# Patient Record
Sex: Female | Born: 1955 | Race: Black or African American | Hispanic: No | Marital: Single | Smoking: Former smoker
Health system: Southern US, Community
[De-identification: ages and names within clinical notes are randomized; demographics above are authoritative.]

## PROBLEM LIST (undated history)

## (undated) DIAGNOSIS — C801 Malignant (primary) neoplasm, unspecified: Secondary | ICD-10-CM

## (undated) DIAGNOSIS — I10 Essential (primary) hypertension: Secondary | ICD-10-CM

## (undated) DIAGNOSIS — J449 Chronic obstructive pulmonary disease, unspecified: Secondary | ICD-10-CM

## (undated) DIAGNOSIS — D649 Anemia, unspecified: Secondary | ICD-10-CM

## (undated) DIAGNOSIS — N189 Chronic kidney disease, unspecified: Secondary | ICD-10-CM

## (undated) DIAGNOSIS — K219 Gastro-esophageal reflux disease without esophagitis: Secondary | ICD-10-CM

## (undated) HISTORY — PX: EYE SURGERY: SHX253

## (undated) HISTORY — PX: FRACTURE SURGERY: SHX138

## (undated) HISTORY — PX: BREAST BIOPSY: SHX20

## (undated) HISTORY — DX: Gastro-esophageal reflux disease without esophagitis: K21.9

## (undated) HISTORY — DX: Anemia, unspecified: D64.9

## (undated) HISTORY — DX: Essential (primary) hypertension: I10

## (undated) HISTORY — DX: Chronic kidney disease, unspecified: N18.9

## (undated) HISTORY — PX: CERVIX LESION DESTRUCTION: SHX591

## (undated) HISTORY — PX: HUMERUS FRACTURE SURGERY: SHX670

---

## 1998-07-17 ENCOUNTER — Ambulatory Visit (HOSPITAL_COMMUNITY): Admission: RE | Admit: 1998-07-17 | Discharge: 1998-07-17 | Payer: Self-pay | Admitting: Internal Medicine

## 1999-07-05 ENCOUNTER — Ambulatory Visit (HOSPITAL_COMMUNITY): Admission: RE | Admit: 1999-07-05 | Discharge: 1999-07-05 | Payer: Self-pay | Admitting: Internal Medicine

## 1999-07-05 ENCOUNTER — Encounter: Payer: Self-pay | Admitting: Internal Medicine

## 2000-09-01 ENCOUNTER — Other Ambulatory Visit: Admission: RE | Admit: 2000-09-01 | Discharge: 2000-09-01 | Payer: Self-pay | Admitting: Internal Medicine

## 2002-09-22 ENCOUNTER — Other Ambulatory Visit: Admission: RE | Admit: 2002-09-22 | Discharge: 2002-09-22 | Payer: Self-pay | Admitting: Internal Medicine

## 2003-05-11 ENCOUNTER — Ambulatory Visit (HOSPITAL_BASED_OUTPATIENT_CLINIC_OR_DEPARTMENT_OTHER): Admission: RE | Admit: 2003-05-11 | Discharge: 2003-05-11 | Payer: Self-pay | Admitting: Orthopedic Surgery

## 2004-02-08 ENCOUNTER — Other Ambulatory Visit: Admission: RE | Admit: 2004-02-08 | Discharge: 2004-02-08 | Payer: Self-pay | Admitting: Internal Medicine

## 2004-08-11 ENCOUNTER — Emergency Department (HOSPITAL_COMMUNITY): Admission: AC | Admit: 2004-08-11 | Discharge: 2004-08-11 | Payer: Self-pay

## 2004-08-15 ENCOUNTER — Ambulatory Visit (HOSPITAL_COMMUNITY): Admission: RE | Admit: 2004-08-15 | Discharge: 2004-08-16 | Payer: Self-pay | Admitting: Orthopedic Surgery

## 2004-10-17 ENCOUNTER — Encounter: Admission: RE | Admit: 2004-10-17 | Discharge: 2005-01-15 | Payer: Self-pay | Admitting: Orthopedic Surgery

## 2008-02-24 ENCOUNTER — Ambulatory Visit: Payer: Self-pay | Admitting: Gastroenterology

## 2008-03-07 ENCOUNTER — Encounter: Admission: RE | Admit: 2008-03-07 | Discharge: 2008-03-07 | Payer: Self-pay | Admitting: Internal Medicine

## 2008-03-07 ENCOUNTER — Encounter (INDEPENDENT_AMBULATORY_CARE_PROVIDER_SITE_OTHER): Payer: Self-pay | Admitting: Diagnostic Radiology

## 2008-03-09 ENCOUNTER — Ambulatory Visit: Payer: Self-pay | Admitting: Gastroenterology

## 2008-03-09 HISTORY — PX: COLONOSCOPY: SHX174

## 2011-05-10 NOTE — Op Note (Signed)
NAME:  Vanessa Williamson, ASPLIN                           ACCOUNT NO.:  [id number]   MEDICAL RECORD NO.:  [id number]                   PATIENT TYPE:  AMB   LOCATION:  [place]                                  FACILITY:  [facility]   PHYSICIAN:  Kathalene Frames. Mayer Camel, M.D.                DATE OF BIRTH:  1956-08-22   DATE OF PROCEDURE:  05/11/2003  DATE OF DISCHARGE:                                 OPERATIVE REPORT   PREOPERATIVE DIAGNOSIS:  Right knee lateral meniscal tear.   POSTOPERATIVE DIAGNOSIS:  Right knee lateral meniscal tear.  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   PROCEDURE:  Right knee arthroscopic partial lateral meniscectomy,  debridement of partial tear of the ACL, and debridement of chondromalacia  lateral tibial condyle.   SURGEON:  Kathalene Frames. Mayer Camel, M.D.   ASSISTANT:  None.   ANESTHESIA:  Local with IV sedation.   ESTIMATED BLOOD LOSS:  Minimal.   FLUIDS REPLACED:  500 mL of crystalloid.  Drains placed; none.   TOURNIQUET TIME:  None.   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  Six-week history of gradually increasing  lateral right knee joint line pain consistent with a lateral meniscal tear  with chondromalacia, failed conservative treatment, and now desires elective  arthroscopic evaluation and treatment of her right knee.   DESCRIPTION OF PROCEDURE:  The patient identified by armband and taken to  the operating room at [facility]. Appropriate anesthetic  monitors were attached and local anesthesia with IV sedation induced with  the patient in the supine position with lateral posts applied to the table  and then the right lower extremity was prepped and draped in the usual  sterile  fashion from the ankle to the mid thigh.  We began the procedure by  making standard inferomedial and inferolateral peripatellar portals allowing  introduction of the arthroscope through the inferolateral portal and the  outflow through the inferomedial portal.  Diagnostic arthroscopy of the  patella and trochlea revealed essentially no chondromalacia.  The medial  compartment also appeared to be in good condition.  There was a partial tear  of the lateral fibers of the ACL with the fibers subluxing in and out of the  lateral compartment and these were debrided back to a stable margin, maybe 5  or 10% of the fibers. 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  The gutters were cleared.  The scope was taken to the  lateral side of the PCL, clearing the posterior compartment after swapping  portals. The knee was then  washed out with normal saline solution and the arthroscopic instruments were  removed.  A dressing of Xeroform, 4x4 dressing, sponges, Webril, and an Ace  wrap applied.  The patient was awakened and taken to the recovery room  without difficulty.                                               Kathalene Frames. Mayer Camel, M.D.    Alphonsus Sias  D:  05/11/2003  T:  05/11/2003  Job:  [id number]
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
## 2012-03-08 ENCOUNTER — Ambulatory Visit (INDEPENDENT_AMBULATORY_CARE_PROVIDER_SITE_OTHER): Payer: Managed Care, Other (non HMO) | Admitting: Family Medicine

## 2012-03-08 VITALS — BP 125/83 | HR 78 | Temp 98.0°F | Resp 18 | Ht 64.0 in | Wt 255.0 lb

## 2012-03-08 DIAGNOSIS — I10 Essential (primary) hypertension: Secondary | ICD-10-CM | POA: Insufficient documentation

## 2012-03-08 DIAGNOSIS — J309 Allergic rhinitis, unspecified: Secondary | ICD-10-CM | POA: Insufficient documentation

## 2012-03-08 DIAGNOSIS — E669 Obesity, unspecified: Secondary | ICD-10-CM

## 2012-03-08 DIAGNOSIS — J301 Allergic rhinitis due to pollen: Secondary | ICD-10-CM

## 2012-03-08 DIAGNOSIS — F17200 Nicotine dependence, unspecified, uncomplicated: Secondary | ICD-10-CM

## 2012-03-08 DIAGNOSIS — K219 Gastro-esophageal reflux disease without esophagitis: Secondary | ICD-10-CM | POA: Insufficient documentation

## 2012-03-08 DIAGNOSIS — Z720 Tobacco use: Secondary | ICD-10-CM

## 2012-03-08 LAB — COMPREHENSIVE METABOLIC PANEL
ALT: 10 U/L (ref 0–35)
AST: 15 U/L (ref 0–37)
Alkaline Phosphatase: 87 U/L (ref 39–117)
CO2: 24 mEq/L (ref 19–32)
Creat: 0.89 mg/dL (ref 0.50–1.10)
Total Bilirubin: 0.5 mg/dL (ref 0.3–1.2)

## 2012-03-08 LAB — LIPID PANEL
Cholesterol: 179 mg/dL (ref 0–200)
LDL Cholesterol: 121 mg/dL — ABNORMAL HIGH (ref 0–99)
Total CHOL/HDL Ratio: 4.4 Ratio
VLDL: 17 mg/dL (ref 0–40)

## 2012-03-08 MED ORDER — LISINOPRIL-HYDROCHLOROTHIAZIDE 20-12.5 MG PO TABS: 1.0000 | ORAL_TABLET | Freq: Every day | ORAL | Status: DC

## 2012-03-08 NOTE — Patient Instructions (Addendum)
Continue same meds.  Continue to work on diet and exercise for weight loss.  Schedule physical in next 6 months. Smoking Cessation This document explains the best ways for you to quit smoking and new treatments to help. It lists new medicines that can double or triple your chances of quitting and quitting for good. It also considers ways to avoid relapses and concerns you may have about quitting, including weight gain. NICOTINE: A POWERFUL ADDICTION If you have tried to quit smoking, you know how hard it can be. It is hard because nicotine is a very addictive drug. For some people, it can be as addictive as heroin or cocaine. Usually, people make 2 or 3 tries, or more, before finally being able to quit. Each time you try to quit, you can learn about what helps and what hurts. Quitting takes hard work and a lot of effort, but you can quit smoking. QUITTING SMOKING IS ONE OF THE MOST IMPORTANT THINGS YOU WILL EVER DO.  You will live longer, feel better, and live better.   The impact on your body of quitting smoking is felt almost immediately:   Within 20 minutes, blood pressure decreases. Pulse returns to its normal level.   After 8 hours, carbon monoxide levels in the blood return to normal. Oxygen level increases.   After 24 hours, chance of heart attack starts to decrease. Breath, hair, and body stop smelling like smoke.   After 48 hours, damaged nerve endings begin to recover. Sense of taste and smell improve.   After 72 hours, the body is virtually free of nicotine. Bronchial tubes relax and breathing becomes easier.   After 2 to 12 weeks, lungs can hold more air. Exercise becomes easier and circulation improves.   Quitting will reduce your risk of having a heart attack, stroke, cancer, or lung disease:   After 1 year, the risk of coronary heart disease is cut in half.   After 5 years, the risk of stroke falls to the same as a nonsmoker.   After 10 years, the risk of lung cancer is  cut in half and the risk of other cancers decreases significantly.   After 15 years, the risk of coronary heart disease drops, usually to the level of a nonsmoker.   If you are pregnant, quitting smoking will improve your chances of having a healthy baby.   The people you live with, especially your children, will be healthier.   You will have extra money to spend on things other than cigarettes.  FIVE KEYS TO QUITTING Studies have shown that these 5 steps will help you quit smoking and quit for good. You have the best chances of quitting if you use them together: 1. Get ready.  2. Get support and encouragement.  3. Learn new skills and behaviors.  4. Get medicine to reduce your nicotine addiction and use it correctly.  5. Be prepared for relapse or difficult situations. Be determined to continue trying to quit, even if you do not succeed at first.  1. GET READY  Set a quit date.   Change your environment.   Get rid of ALL cigarettes, ashtrays, matches, and lighters in your home, car, and place of work.   Do not let people smoke in your home.   Review your past attempts to quit. Think about what worked and what did not.   Once you quit, do not smoke. NOT EVEN A PUFF!  2. GET SUPPORT AND ENCOURAGEMENT Studies have shown that  you have a better chance of being successful if you have help. You can get support in many ways.  Tell your family, friends, and coworkers that you are going to quit and need their support. Ask them not to smoke around you.   Talk to your caregivers (doctor, dentist, nurse, pharmacist, psychologist, and/or smoking counselor).   Get individual, group, or telephone counseling and support. The more counseling you have, the better your chances are of quitting. Programs are available at [employer] and health centers. Call your local health department for information about programs in your area.   Spiritual beliefs and practices may help some smokers quit.    Quit meters are Insurance underwriter that keep track of quit statistics, such as amount of "quit-time," cigarettes not smoked, and money saved.   Many smokers find one or more of the many self-help books available useful in helping them quit and stay off tobacco.  3. LEARN NEW SKILLS AND BEHAVIORS  Try to distract yourself from urges to smoke. Talk to someone, go for a walk, or occupy your time with a task.   When you first try to quit, change your routine. Take a different route to work. Drink tea instead of coffee. Eat breakfast in a different place.   Do something to reduce your stress. Take a hot bath, exercise, or read a book.   Plan something enjoyable to do every day. Reward yourself for not smoking.   Explore interactive web-based programs that specialize in helping you quit.  4. GET MEDICINE AND USE IT CORRECTLY Medicines can help you stop smoking and decrease the urge to smoke. Combining medicine with the above behavioral methods and support can quadruple your chances of successfully quitting smoking. The U.[place]. Food and Drug Administration (FDA) has approved 7 medicines to help you quit smoking. These medicines fall into 3 categories.  Nicotine replacement therapy (delivers nicotine to your body without the negative effects and risks of smoking):   Nicotine gum: Available over-the-counter.   Nicotine lozenges: Available over-the-counter.   Nicotine inhaler: Available by prescription.   Nicotine nasal spray: Available by prescription.   Nicotine skin patches (transdermal): Available by prescription and over-the-counter.   Antidepressant medicine (helps people abstain from smoking, but how this works is unknown):   Bupropion sustained-release (SR) tablets: Available by prescription.   Nicotinic receptor partial agonist (simulates the effect of nicotine in your brain):   Varenicline tartrate tablets: Available by prescription.   Ask your  caregiver for advice about which medicines to use and how to use them. Carefully read the information on the package.   Everyone who is trying to quit may benefit from using a medicine. If you are pregnant or trying to become pregnant, nursing an infant, you are under age 33, or you smoke fewer than 10 cigarettes per day, talk to your caregiver before taking any nicotine replacement medicines.   You should stop using a nicotine replacement product and call your caregiver if you experience nausea, dizziness, weakness, vomiting, fast or irregular heartbeat, mouth problems with the lozenge or gum, or redness or swelling of the skin around the patch that does not go away.   Do not use any other product containing nicotine while using a nicotine replacement product.   Talk to your caregiver before using these products if you have diabetes, heart disease, asthma, stomach ulcers, you had a recent heart attack, you have high blood pressure that is not controlled with medicine, a history  of irregular heartbeat, or you have been prescribed medicine to help you quit smoking.  5. BE PREPARED FOR RELAPSE OR DIFFICULT SITUATIONS  Most relapses occur within the first 3 months after quitting. Do not be discouraged if you start smoking again. Remember, most people try several times before they finally quit.   You may have symptoms of withdrawal because your body is used to nicotine. You may crave cigarettes, be irritable, feel very hungry, cough often, get headaches, or have difficulty concentrating.   The withdrawal symptoms are only temporary. They are strongest when you first quit, but they will go away within 10 to 14 days.  Here are some difficult situations to watch for:  Alcohol. Avoid drinking alcohol. Drinking lowers your chances of successfully quitting.   Caffeine. Try to reduce the amount of caffeine you consume. It also lowers your chances of successfully quitting.   Other smokers. Being around  smoking can make you want to smoke. Avoid smokers.   Weight gain. Many smokers will gain weight when they quit, usually less than 10 pounds. Eat a healthy diet and stay active. Do not let weight gain distract you from your main goal, quitting smoking. Some medicines that help you quit smoking may also help delay weight gain. You can always lose the weight gained after you quit.   Bad mood or depression. There are a lot of ways to improve your mood other than smoking.  If you are having problems with any of these situations, talk to your caregiver. SPECIAL SITUATIONS AND CONDITIONS Studies suggest that everyone can quit smoking. Your situation or condition can give you a special reason to quit.  Pregnant women/new mothers: By quitting, you protect your baby's health and your own.   Hospitalized patients: By quitting, you reduce health problems and help healing.   Heart attack patients: By quitting, you reduce your risk of a second heart attack.   Lung, head, and neck cancer patients: By quitting, you reduce your chance of a second cancer.   Parents of children and adolescents: By quitting, you protect your children from illnesses caused by secondhand smoke.  QUESTIONS TO THINK ABOUT Think about the following questions before you try to stop smoking. You may want to talk about your answers with your caregiver.  Why do you want to quit?   If you tried to quit in the past, what helped and what did not?   What will be the most difficult situations for you after you quit? How will you plan to handle them?   Who can help you through the tough times? Your family? Friends? Caregiver?   What pleasures do you get from smoking? What ways can you still get pleasure if you quit?  Here are some questions to ask your caregiver:  How can you help me to be successful at quitting?   What medicine do you think would be best for me and how should I take it?   What should I do if I need more help?    What is smoking withdrawal like? How can I get information on withdrawal?  Quitting takes hard work and a lot of effort, but you can quit smoking. FOR MORE INFORMATION  Smokefree.gov (Inrails.tn) provides free, accurate, evidence-based information and professional assistance to help support the immediate and long-term needs of people trying to quit smoking. Document Released: 12/03/2001 Document Revised: 11/28/2011 Document Reviewed: 09/25/2009 [facility] Patient Information 2012 [place].

## 2012-03-08 NOTE — Progress Notes (Signed)
  Subjective:    Patient ID: Vanessa Williamson, female    DOB: 1956-08-09, 56 y.o.   MRN: [id number]  HPI  Vanessa Williamson is a 56 y.o. female HTN - no recent home BP's.   Feels well.     Last dose last night.  On baby asa qd.  Lost 30# past few years - 6 pounds since July 2012.  Stopped sodas.  Had bloodwork last year at work - cholesterol abnormal.  coffee this am - some creamer and splenda at 7:15am.  Allergies ok, notes sinuses flair with change in season - has flonase - helps sx's, claritin qd (otc)  Tobacco abuse.  Contemplating quitting. 1 ppd.  Review of Systems  Constitutional: Negative for fever and chills.  HENT: Positive for congestion, rhinorrhea and sneezing.        With allergy flairs.  Respiratory: Negative for cough, chest tightness and shortness of breath.        Smokers cough.  No new symptoms.   Cardiovascular: Negative for chest pain and palpitations.  Neurological: Negative for dizziness, light-headedness and headaches.       Objective:   Physical Exam  Constitutional: She is oriented to person, place, and time. She appears well-developed and well-nourished.       Overweight.  HENT:  Head: Normocephalic and atraumatic.  Right Ear: External ear normal.  Left Ear: External ear normal.  Mouth/Throat: Oropharynx is clear and moist.  Eyes: Conjunctivae are normal. Pupils are equal, round, and reactive to light.  Cardiovascular: Normal rate, regular rhythm, normal heart sounds and intact distal pulses.   No murmur heard. Pulmonary/Chest: Effort normal and breath sounds normal. No respiratory distress. She has no wheezes.  Neurological: She is alert and oriented to person, place, and time.  Skin: Skin is warm and dry.  Psychiatric: She has a normal mood and affect. Her behavior is normal.        Assessment & Plan:  Vanessa Williamson is a 56 y.o. female 1. HTN (hypertension)  Comprehensive metabolic panel, Lipid panel  2. Allergic rhinitis    3. Obesity    4. Tobacco  abuse     htn controlled.  Continue same dose lisinoprl/hct - # 30 sent to pharmacy and #90, 1 rf hand written for sendout pharmacy. abnl lipids by hx - check CMP, lipid panel.  Discussed tobacco cessation - contemplating stage - handout on options.  If any increased or change in cough, return to clinic.  Allergies controlled with flonase prn and claritin.  Schedule cpe in next 6 months.

## 2012-08-18 ENCOUNTER — Ambulatory Visit (INDEPENDENT_AMBULATORY_CARE_PROVIDER_SITE_OTHER): Payer: Managed Care, Other (non HMO) | Admitting: Internal Medicine

## 2012-08-18 ENCOUNTER — Ambulatory Visit: Payer: Managed Care, Other (non HMO)

## 2012-08-18 ENCOUNTER — Telehealth: Payer: Self-pay

## 2012-08-18 VITALS — BP 138/80 | HR 97 | Temp 98.4°F | Resp 18 | Ht 64.0 in | Wt 249.0 lb

## 2012-08-18 DIAGNOSIS — F17200 Nicotine dependence, unspecified, uncomplicated: Secondary | ICD-10-CM

## 2012-08-18 DIAGNOSIS — R9389 Abnormal findings on diagnostic imaging of other specified body structures: Secondary | ICD-10-CM

## 2012-08-18 DIAGNOSIS — I10 Essential (primary) hypertension: Secondary | ICD-10-CM

## 2012-08-18 DIAGNOSIS — R079 Chest pain, unspecified: Secondary | ICD-10-CM

## 2012-08-18 DIAGNOSIS — J449 Chronic obstructive pulmonary disease, unspecified: Secondary | ICD-10-CM

## 2012-08-18 LAB — POCT CBC
Hemoglobin: 12.3 g/dL (ref 12.2–16.2)
Lymph, poc: 1.2 (ref 0.6–3.4)
MCH, POC: 30.1 pg (ref 27–31.2)
MCHC: 30.4 g/dL — AB (ref 31.8–35.4)
MPV: 9 fL (ref 0–99.8)
POC MID %: 7.3 %M (ref 0–12)
WBC: 3.9 10*3/uL — AB (ref 4.6–10.2)

## 2012-08-18 LAB — COMPREHENSIVE METABOLIC PANEL
BUN: 21 mg/dL (ref 6–23)
CO2: 27 mEq/L (ref 19–32)
Creat: 0.91 mg/dL (ref 0.50–1.10)
Glucose, Bld: 92 mg/dL (ref 70–99)
Total Bilirubin: 0.5 mg/dL (ref 0.3–1.2)
Total Protein: 7 g/dL (ref 6.0–8.3)

## 2012-08-18 MED ORDER — MELOXICAM 15 MG PO TABS: 15.0000 mg | ORAL_TABLET | Freq: Every day | ORAL | Status: AC

## 2012-08-18 NOTE — Progress Notes (Signed)
  Subjective:    Patient ID: Vanessa Williamson, female    DOB: 1956/07/12, 56 y.o.   MRN: [id number]  HPI presents today L side chest pain x 1 week. It has been getting worse. The pain is worse at night. Sleeps elevated cant sleep on L side. The pain is worse with bending,twistin, and deep breathing. does feel SOB because is afraid to breathe to deep due to pain. Walking or activities does not make it worse. Did have some nausea today. She does have HX of acid reflux but not on any current treatment.. Does not have any HX of palpitations or heart problems. Has been having a cough but relates that to smoking. She is a current smoker of 1 ppd.    Review of Systems     Objective:   Physical Exam        Assessment & Plan:

## 2012-08-18 NOTE — Telephone Encounter (Signed)
Called with call report for chest xray. Please review
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
## 2012-08-19 DIAGNOSIS — J449 Chronic obstructive pulmonary disease, unspecified: Secondary | ICD-10-CM | POA: Insufficient documentation

## 2012-08-19 NOTE — Telephone Encounter (Signed)
Not available at any of 3 numbers and home # diusconnected I need to talk to her because we need to proceed with another study CXR showed an area on or next to L 9th rib that needs to be identified by CT We need to schedule CT Lung Would you keep trying to reach her

## 2012-08-20 NOTE — Telephone Encounter (Signed)
I have called her, unable to reach, I have also mailed copy of the chest xray with a note to call me about this issue.

## 2012-08-20 NOTE — Telephone Encounter (Signed)
PT STATES SOMEONE TRIED TO CALL HER AND SHE HAVE NO IDEA WHY. [place] [phone]

## 2012-08-20 NOTE — Telephone Encounter (Signed)
Tried to call pt again at # pt left and got the same message that she is not available and to try again later. Did not allow me to LM.

## 2012-08-20 NOTE — Telephone Encounter (Signed)
I have called patient about her CXR< should this CT scan be with or without contrast? Please advise.

## 2012-08-21 NOTE — Telephone Encounter (Signed)
With contrast

## 2012-08-23 NOTE — Addendum Note (Signed)
Addended byCandice Camp on: 08/23/2012 01:25 PM   Modules accepted: Orders

## 2012-08-23 NOTE — Telephone Encounter (Signed)
I have ordered CT scan, patient aware we will be calling her with this.

## 2012-08-25 ENCOUNTER — Encounter: Payer: Self-pay | Admitting: Family Medicine

## 2012-08-25 ENCOUNTER — Ambulatory Visit: Payer: Managed Care, Other (non HMO)

## 2012-08-25 ENCOUNTER — Ambulatory Visit (INDEPENDENT_AMBULATORY_CARE_PROVIDER_SITE_OTHER): Payer: Managed Care, Other (non HMO) | Admitting: Family Medicine

## 2012-08-25 VITALS — BP 130/84 | HR 108 | Temp 98.0°F | Resp 16

## 2012-08-25 DIAGNOSIS — M79609 Pain in unspecified limb: Secondary | ICD-10-CM

## 2012-08-25 DIAGNOSIS — M25579 Pain in unspecified ankle and joints of unspecified foot: Secondary | ICD-10-CM

## 2012-08-25 DIAGNOSIS — M79673 Pain in unspecified foot: Secondary | ICD-10-CM

## 2012-08-25 DIAGNOSIS — T148XXA Other injury of unspecified body region, initial encounter: Secondary | ICD-10-CM

## 2012-08-25 MED ORDER — TRAMADOL HCL 50 MG PO TABS: 50.0000 mg | ORAL_TABLET | Freq: Three times a day (TID) | ORAL | Status: AC | PRN

## 2012-08-25 NOTE — Progress Notes (Signed)
[facility]:  Office Visit  Chief Complaint:  Chief Complaint  Patient presents with  . Ankle Pain    right foot/ankle pain after slipping on gumballs in the yard between buildings at work    HPI: Vanessa Williamson is a 56 y.o. female who complains of  Right ankle 7-8/10 sharp localized pain today, stepped on gumball plant dropping and fell. Does not know how she fell whether inversion or eversion injury. Pain is sharp and on lateral right foot. + ankle swelling  Past Medical History  Diagnosis Date  . Hypertension    Past Surgical History  Procedure Date  . Humerus fracture surgery    History   Social History  . Marital Status: Single    Spouse Name: N/A    Number of Children: N/A  . Years of Education: N/A   Social History Main Topics  . Smoking status: Current Everyday Smoker  . Smokeless tobacco: None  . Alcohol Use: No  . Drug Use: None  . Sexually Active: None   Other Topics Concern  . None   Social History Narrative  . None   No family history on file. No Known Allergies Prior to Admission medications   Medication Sig Start Date End Date Taking? Authorizing Provider  aspirin 81 MG tablet Take 81 mg by mouth daily.   Yes Historical Provider, MD  fluticasone (FLONASE) 50 MCG/ACT nasal spray Place 2 sprays into the nose daily.   Yes Historical Provider, MD  lisinopril-hydrochlorothiazide (PRINZIDE,ZESTORETIC) 20-12.5 MG per tablet Take 1 tablet by mouth daily. 03/08/12  Yes Wendie Agreste, MD  loratadine (CLARITIN) 10 MG tablet Take 10 mg by mouth daily.   Yes Historical Provider, MD  meloxicam (MOBIC) 15 MG tablet Take 1 tablet (15 mg total) by mouth daily. 08/18/12 08/18/13 Yes Leandrew Koyanagi, MD     ROS: The patient denies fevers, chills, night sweats, unintentional weight loss, chest pain, palpitations, wheezing, dyspnea on exertion, nausea, vomiting, abdominal pain, dysuria, hematuria, melena, numbness, weakness, or tingling.   All  other systems have been reviewed and were otherwise negative with the exception of those mentioned in the HPI and as above.    PHYSICAL EXAM: Filed Vitals:   08/25/12 1538  BP: 130/84  Pulse: 108  Temp: 98 F (36.7 C)  Resp: 16   There were no vitals filed for this visit. There is no height or weight on file to calculate BMI.  General: Alert, no acute distress HEENT:  Normocephalic, atraumatic, oropharynx patent.  Cardiovascular:  Regular rate and rhythm, no rubs murmurs or gallops.  No Carotid bruits, radial pulse intact. No pedal edema.  Respiratory: Clear to auscultation bilaterally.  No wheezes, rales, or rhonchi.  No cyanosis, no use of accessory musculature GI: No organomegaly, abdomen is soft and non-tender, positive bowel sounds.  No masses. Skin: No rashes. Neurologic: Facial musculature symmetric. Psychiatric: Patient is appropriate throughout our interaction. Lymphatic: No cervical lymphadenopathy Musculoskeletal: In wheelchair. + right lateral malleoli swelling. + tenderness at MTP  5th lateral foot 5/5 strength, sensation intact. + DP   LABS: Results for orders placed in visit on 08/18/12  POCT SEDIMENTATION RATE      Component Value Range   POCT SED RATE 55 (*) 0 - 22 mm/hr  COMPREHENSIVE METABOLIC PANEL      Component Value Range   Sodium 139  135 - 145 mEq/L   Potassium 4.8  3.5 - 5.3 mEq/L   Chloride 105  96 - 112 mEq/L   CO2 27  19 - 32 mEq/L   Glucose, Bld 92  70 - 99 mg/dL   BUN 21  6 - 23 mg/dL   Creat 0.91  0.50 - 1.10 mg/dL   Total Bilirubin 0.5  0.3 - 1.2 mg/dL   Alkaline Phosphatase 82  39 - 117 U/L   AST 15  0 - 37 U/L   ALT 10  0 - 35 U/L   Total Protein 7.0  6.0 - 8.3 g/dL   Albumin 4.6  3.5 - 5.2 g/dL   Calcium 9.6  8.4 - 10.5 mg/dL  POCT CBC      Component Value Range   WBC 3.9 (*) 4.6 - 10.2 K/uL   Lymph, poc 1.2  0.6 - 3.4   POC LYMPH PERCENT 32.0  10 - 50 %L   MID (cbc) 0.3  0 - 0.9   POC MID % 7.3  0 - 12 %M   POC Granulocyte  2.4  2 - 6.9   Granulocyte percent 60.7  37 - 80 %G   RBC 4.08  4.04 - 5.48 M/uL   Hemoglobin 12.3  12.2 - 16.2 g/dL   HCT, POC 40.4  37.7 - 47.9 %   MCV 98.9 (*) 80 - 97 fL   MCH, POC 30.1  27 - 31.2 pg   MCHC 30.4 (*) 31.8 - 35.4 g/dL   RDW, POC 13.4     Platelet Count, POC 179  142 - 424 K/uL   MPV 9.0  0 - 99.8 fL     EKG/XRAY:   Primary read interpreted by Dr. Marin Comment at [facility]. Negative for fx/dislocation + soft tissue swelling on lateral ankle   ASSESSMENT/PLAN: Encounter Diagnoses  Name Primary?  Marland Kitchen Ankle pain Yes  . Foot pain   . Sprain and strain     Continue with Mobic, Rx Tramadol ROM, RICE Patient wants crutches, advise to be non-weightbearing as long as have as much pain as she has. F/u in 1-2 weeks or prn May need work note if she cannot tolerate pain, consider 2 days off if needed    LE, [place], DO 08/25/2012 5:07 PM

## 2012-09-04 ENCOUNTER — Ambulatory Visit
Admission: RE | Admit: 2012-09-04 | Discharge: 2012-09-04 | Disposition: A | Discharge status: Home / Self Care | Payer: 59 | Source: Ambulatory Visit | Attending: Internal Medicine | Admitting: Internal Medicine

## 2012-09-04 DIAGNOSIS — R9389 Abnormal findings on diagnostic imaging of other specified body structures: Secondary | ICD-10-CM

## 2012-09-04 MED ORDER — IOHEXOL 300 MG/ML  SOLN
75.0000 mL | Freq: Once | INTRAMUSCULAR | Status: AC | PRN
  Administered 2012-09-04: 75 mL via INTRAVENOUS

## 2012-09-06 ENCOUNTER — Telehealth: Payer: Self-pay | Admitting: Internal Medicine

## 2012-09-06 NOTE — Telephone Encounter (Signed)
No answer to my phone call/she needs exploration of the CT scan and plan for referral to oncology to evaluate whether these lymph nodes need biopsying at this point

## 2012-09-09 ENCOUNTER — Telehealth: Payer: Self-pay | Admitting: Internal Medicine

## 2012-09-09 NOTE — Telephone Encounter (Signed)
I have tried to contact her twice / have been unable to get an answer I'll have the clinical message to try to contact her to arrange an exam by Dr. Burney Gauze to check for lymphadenopathy as noted on her scan

## 2012-09-13 ENCOUNTER — Other Ambulatory Visit: Payer: Self-pay | Admitting: Radiology

## 2012-09-13 DIAGNOSIS — R590 Localized enlarged lymph nodes: Secondary | ICD-10-CM

## 2012-09-15 ENCOUNTER — Telehealth: Payer: Self-pay | Admitting: Hematology & Oncology

## 2012-09-15 NOTE — Telephone Encounter (Signed)
Pt aware of 9-30 appointment

## 2012-09-21 ENCOUNTER — Ambulatory Visit (HOSPITAL_BASED_OUTPATIENT_CLINIC_OR_DEPARTMENT_OTHER): Payer: 59 | Admitting: Hematology & Oncology

## 2012-09-21 ENCOUNTER — Other Ambulatory Visit (HOSPITAL_BASED_OUTPATIENT_CLINIC_OR_DEPARTMENT_OTHER): Payer: 59 | Admitting: Lab

## 2012-09-21 ENCOUNTER — Ambulatory Visit: Payer: 59

## 2012-09-21 ENCOUNTER — Other Ambulatory Visit (HOSPITAL_COMMUNITY)
Admission: RE | Admit: 2012-09-21 | Discharge: 2012-09-21 | Disposition: A | Discharge status: Home / Self Care | Payer: 59 | Source: Ambulatory Visit | Attending: Hematology & Oncology | Admitting: Hematology & Oncology

## 2012-09-21 VITALS — BP 114/63 | HR 93 | Temp 97.8°F | Resp 18 | Ht 64.0 in | Wt 248.0 lb

## 2012-09-21 DIAGNOSIS — R590 Localized enlarged lymph nodes: Secondary | ICD-10-CM

## 2012-09-21 DIAGNOSIS — R599 Enlarged lymph nodes, unspecified: Secondary | ICD-10-CM | POA: Insufficient documentation

## 2012-09-21 LAB — CBC WITH DIFFERENTIAL (CANCER CENTER ONLY)
BASO%: 0.3 % (ref 0.0–2.0)
EOS%: 1.1 % (ref 0.0–7.0)
LYMPH#: 1.1 10*3/uL (ref 0.9–3.3)
MCH: 32.4 pg (ref 26.0–34.0)
MCHC: 34 g/dL (ref 32.0–36.0)
MONO%: 7.7 % (ref 0.0–13.0)
NEUT#: 2.1 10*3/uL (ref 1.5–6.5)
Platelets: 163 10*3/uL (ref 145–400)
RDW: 12.7 % (ref 11.1–15.7)

## 2012-09-21 NOTE — Progress Notes (Signed)
This office note has been dictated.
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
## 2012-09-25 ENCOUNTER — Other Ambulatory Visit: Payer: Self-pay | Admitting: Family Medicine

## 2012-09-28 LAB — COMPREHENSIVE METABOLIC PANEL
AST: 14 U/L (ref 0–37)
Alkaline Phosphatase: 82 U/L (ref 39–117)
Glucose, Bld: 116 mg/dL — ABNORMAL HIGH (ref 70–99)
Potassium: 3.8 mEq/L (ref 3.5–5.3)
Sodium: 141 mEq/L (ref 135–145)
Total Bilirubin: 0.5 mg/dL (ref 0.3–1.2)
Total Protein: 6.9 g/dL (ref 6.0–8.3)

## 2012-09-28 LAB — PROTEIN ELECTROPHORESIS, SERUM, WITH REFLEX: Total Protein, Serum Electrophoresis: 6.9 g/dL (ref 6.0–8.3)

## 2012-09-28 LAB — LACTATE DEHYDROGENASE: LDH: 133 U/L (ref 94–250)

## 2012-09-28 LAB — IGG, IGA, IGM
IgA: 129 mg/dL (ref 69–380)
IgM, Serum: 48 mg/dL — ABNORMAL LOW (ref 52–322)

## 2012-12-04 ENCOUNTER — Telehealth: Payer: Self-pay | Admitting: Hematology & Oncology

## 2012-12-04 NOTE — Telephone Encounter (Signed)
Pt cx 12-18 CT and 12-23 MD appointments. She said her last CT was fine and felt like she didn't need another one and she feels fine. Dr. Marin Olp aware appointments were canceled

## 2012-12-09 ENCOUNTER — Ambulatory Visit (HOSPITAL_BASED_OUTPATIENT_CLINIC_OR_DEPARTMENT_OTHER): Payer: 59

## 2012-12-14 ENCOUNTER — Other Ambulatory Visit: Payer: 59 | Admitting: Lab

## 2012-12-14 ENCOUNTER — Ambulatory Visit: Payer: 59 | Admitting: Hematology & Oncology

## 2013-01-26 ENCOUNTER — Ambulatory Visit (INDEPENDENT_AMBULATORY_CARE_PROVIDER_SITE_OTHER): Payer: BC Managed Care – PPO | Admitting: Family Medicine

## 2013-01-26 VITALS — BP 128/81 | HR 97 | Temp 98.0°F | Resp 18 | Ht 64.0 in | Wt 245.0 lb

## 2013-01-26 DIAGNOSIS — I10 Essential (primary) hypertension: Secondary | ICD-10-CM

## 2013-01-26 DIAGNOSIS — Z79899 Other long term (current) drug therapy: Secondary | ICD-10-CM

## 2013-01-26 LAB — BASIC METABOLIC PANEL
Chloride: 104 mEq/L (ref 96–112)
Glucose, Bld: 92 mg/dL (ref 70–99)
Potassium: 4.4 mEq/L (ref 3.5–5.3)
Sodium: 137 mEq/L (ref 135–145)

## 2013-01-26 MED ORDER — LISINOPRIL-HYDROCHLOROTHIAZIDE 20-12.5 MG PO TABS: 1.0000 | ORAL_TABLET | Freq: Every day | ORAL | Status: DC

## 2013-01-26 NOTE — Progress Notes (Signed)
  Subjective:    Patient ID: Vanessa Williamson, female    DOB: 01-Jul-1956, 57 y.o.   MRN: [id number]  HPI  Vanessa Williamson is here for refill of her BP med.  She does not have any complaints or concerns and does not check her BP outside of the office.  She has had cream in her coffee this a.m.    Past Medical History  Diagnosis Date  . Hypertension    Current Outpatient Prescriptions on File Prior to Visit  Medication Sig Dispense Refill  . aspirin 81 MG tablet Take 81 mg by mouth daily.      Marland Kitchen lisinopril-hydrochlorothiazide (PRINZIDE,ZESTORETIC) 20-12.5 MG per tablet Take 1 tablet by mouth daily.  90 tablet  2  . loratadine (CLARITIN) 10 MG tablet Take 10 mg by mouth daily.      . fluticasone (FLONASE) 50 MCG/ACT nasal spray Place 2 sprays into the nose daily.      . meloxicam (MOBIC) 15 MG tablet Take 1 tablet (15 mg total) by mouth daily.  30 tablet  0   No Known Allergies   Review of Systems  Constitutional: Negative for fever, chills, diaphoresis and appetite change.  Eyes: Negative for visual disturbance.  Respiratory: Negative for cough and shortness of breath.   Cardiovascular: Negative for chest pain, palpitations and leg swelling.  Genitourinary: Negative for decreased urine volume.  Neurological: Negative for syncope and headaches.  Hematological: Does not bruise/bleed easily.      BP 128/81  Pulse 97  Temp 98 F (36.7 C) (Oral)  Resp 18  Ht 5\' 4"  (1.626 m)  Wt 245 lb (111.131 kg)  BMI 42.05 kg/m2  SpO2 96% Objective:   Physical Exam  Constitutional: She is oriented to person, place, and time. She appears well-developed and well-nourished. No distress.  HENT:  Head: Normocephalic and atraumatic.  Right Ear: External ear normal.  Left Ear: External ear normal.  Eyes: Conjunctivae normal are normal. No scleral icterus.  Neck: Normal range of motion. Neck supple. No thyromegaly present.  Cardiovascular: Normal rate, regular rhythm, normal heart sounds and intact distal  pulses.   Pulmonary/Chest: Effort normal and breath sounds normal. No respiratory distress.  Musculoskeletal: She exhibits no edema.  Lymphadenopathy:    She has no cervical adenopathy.  Neurological: She is alert and oriented to person, place, and time.  Skin: Skin is warm and dry. She is not diaphoretic. No erythema.  Psychiatric: She has a normal mood and affect. Her behavior is normal.      Assessment & Plan:   1. HTN (hypertension)  lisinopril-hydrochlorothiazide (PRINZIDE,ZESTORETIC) 20-12.5 MG per tablet, Basic metabolic panel  2. Encounter for long-term (current) use of other medications    Sent one fill to a local pharmacy and 9 additional mos to a mail in pharmacy for a total of a year. Check bmp. Last LDL was 121 in 02/2012 - rec pt be fasting at next OV to recheck lipids. Meds ordered this encounter  Medications  . lisinopril-hydrochlorothiazide (PRINZIDE,ZESTORETIC) 20-12.5 MG per tablet    Sig: Take 1 tablet by mouth daily.    Dispense:  90 tablet    Refill:  2  . DISCONTD: lisinopril-hydrochlorothiazide (PRINZIDE,ZESTORETIC) 20-12.5 MG per tablet    Sig: Take 1 tablet by mouth daily.    Dispense:  90 tablet    Refill:  0

## 2013-01-27 ENCOUNTER — Encounter: Payer: Self-pay | Admitting: *Deleted

## 2013-09-14 ENCOUNTER — Ambulatory Visit (INDEPENDENT_AMBULATORY_CARE_PROVIDER_SITE_OTHER): Payer: BC Managed Care – PPO | Admitting: Internal Medicine

## 2013-09-14 VITALS — BP 122/66 | HR 92 | Temp 98.7°F | Resp 20 | Ht 64.0 in | Wt 250.0 lb

## 2013-09-14 DIAGNOSIS — I10 Essential (primary) hypertension: Secondary | ICD-10-CM

## 2013-09-14 DIAGNOSIS — Z23 Encounter for immunization: Secondary | ICD-10-CM

## 2013-09-14 MED ORDER — LISINOPRIL-HYDROCHLOROTHIAZIDE 20-12.5 MG PO TABS: 1.0000 | ORAL_TABLET | Freq: Every day | ORAL | Status: DC

## 2013-09-14 NOTE — Progress Notes (Signed)
  Subjective:    Patient ID: Vanessa Williamson, female    DOB: Mar 20, 1956, 57 y.o.   MRN: [id number]  HPI Just comes in today for bridge of bp meds until mail order arrives. No side effects of meds. No swelling,CP,or SOB  Review of Systems     Objective:   Physical Exam  Constitutional: She is oriented to person, place, and time. She appears well-developed and well-nourished.  Cardiovascular: Normal rate, regular rhythm and normal heart sounds.   Pulmonary/Chest: Effort normal and breath sounds normal.  Neurological: She is alert and oriented to person, place, and time.  Skin: Skin is warm and dry.          Assessment & Plan:  1- HTN,well controlled.Refill lisinopril/HCTZ for 30 days 2- Follow up for CPE in 3 months w/ Dr Brigitte Pulse as planned  Flu shot given  DG/RPD

## 2013-11-01 ENCOUNTER — Other Ambulatory Visit: Payer: Self-pay

## 2013-11-01 DIAGNOSIS — I10 Essential (primary) hypertension: Secondary | ICD-10-CM

## 2013-11-01 MED ORDER — LISINOPRIL-HYDROCHLOROTHIAZIDE 20-12.5 MG PO TABS: 1.0000 | ORAL_TABLET | Freq: Every day | ORAL | Status: DC

## 2013-11-06 ENCOUNTER — Other Ambulatory Visit: Payer: Self-pay | Admitting: Internal Medicine

## 2013-12-10 ENCOUNTER — Encounter: Payer: Self-pay | Admitting: Family Medicine

## 2013-12-10 ENCOUNTER — Ambulatory Visit (INDEPENDENT_AMBULATORY_CARE_PROVIDER_SITE_OTHER): Payer: BC Managed Care – PPO | Admitting: Family Medicine

## 2013-12-10 VITALS — BP 130/72 | HR 94 | Temp 97.6°F | Resp 16 | Ht 64.0 in | Wt 248.0 lb

## 2013-12-10 DIAGNOSIS — F17200 Nicotine dependence, unspecified, uncomplicated: Secondary | ICD-10-CM | POA: Insufficient documentation

## 2013-12-10 DIAGNOSIS — I10 Essential (primary) hypertension: Secondary | ICD-10-CM

## 2013-12-10 DIAGNOSIS — Z0000 Encounter for general adult medical examination without abnormal findings: Secondary | ICD-10-CM

## 2013-12-10 DIAGNOSIS — K439 Ventral hernia without obstruction or gangrene: Secondary | ICD-10-CM

## 2013-12-10 DIAGNOSIS — E559 Vitamin D deficiency, unspecified: Secondary | ICD-10-CM

## 2013-12-10 LAB — CBC WITH DIFFERENTIAL/PLATELET
Eosinophils Relative: 1 % (ref 0–5)
HCT: 38.4 % (ref 36.0–46.0)
Lymphocytes Relative: 27 % (ref 12–46)
Lymphs Abs: 1.1 10*3/uL (ref 0.7–4.0)
MCH: 31.7 pg (ref 26.0–34.0)
MCV: 90.1 fL (ref 78.0–100.0)
Monocytes Absolute: 0.3 10*3/uL (ref 0.1–1.0)
RBC: 4.26 MIL/uL (ref 3.87–5.11)
WBC: 4.1 10*3/uL (ref 4.0–10.5)

## 2013-12-10 LAB — TSH: TSH: 0.625 u[IU]/mL (ref 0.350–4.500)

## 2013-12-10 LAB — COMPREHENSIVE METABOLIC PANEL
BUN: 15 mg/dL (ref 6–23)
CO2: 25 mEq/L (ref 19–32)
Calcium: 9.1 mg/dL (ref 8.4–10.5)
Chloride: 105 mEq/L (ref 96–112)
Creat: 0.88 mg/dL (ref 0.50–1.10)
Glucose, Bld: 91 mg/dL (ref 70–99)

## 2013-12-10 LAB — LIPID PANEL
Cholesterol: 162 mg/dL (ref 0–200)
HDL: 38 mg/dL — ABNORMAL LOW (ref >39)
Triglycerides: 88 mg/dL (ref <150)

## 2013-12-10 LAB — HEPATITIS C ANTIBODY: HCV Ab: NEGATIVE

## 2013-12-10 MED ORDER — FLUTICASONE PROPIONATE 50 MCG/ACT NA SUSP: 2.0000 | Freq: Every day | NASAL | Status: DC

## 2013-12-10 MED ORDER — LISINOPRIL-HYDROCHLOROTHIAZIDE 20-12.5 MG PO TABS: ORAL_TABLET | ORAL | Status: DC

## 2013-12-10 NOTE — Progress Notes (Signed)
   Subjective:    Patient ID: Vanessa Williamson, female    DOB: [birth year], 57 y.o.   MRN: [id number]  HPI    Review of Systems  Constitutional: Negative.   HENT: Positive for postnasal drip, sinus pressure and sneezing.   Eyes: Negative.   Respiratory: Negative.   Cardiovascular: Negative.   Gastrointestinal: Negative.   Endocrine: Negative.   Genitourinary: Negative.   Musculoskeletal: Positive for arthralgias.  Skin: Negative.   Allergic/Immunologic: Positive for environmental allergies.  Neurological: Negative.   Hematological: Negative.   Psychiatric/Behavioral: Negative.        Objective:   Physical Exam        Assessment & Plan:

## 2013-12-10 NOTE — Progress Notes (Signed)
Subjective:    Patient ID: Vanessa Williamson, female    DOB: June 29, 1956, 57 y.o.   MRN: [id number] Chief Complaint  Patient presents with  . Annual Exam    w/pap    HPI  Not on any vitamins or supplements.  Lots of green leafy vegetables in diet but no significant sources of dairy.  Has been starting cranberry juice supplemented w/ ca regularly.;  The last mammogram was 2009.  Her last colonoscopy was back in 2009 and completely nml per pt.  Last pap was around 2009.  Did have an abnormal pap smear w/ freezing back around 1970s-80 but all since have been normal.  No vaginal sxs. Periods stopped around 14 yrs prior w/o any vaginal bleeding since.  Not sexually active so declines STD testing. Does not get flu shot.  Would like to have ventral hernia repaired. She was advised to do watchful waiting by PCP prior but seems to be getting bigger and bothering her a little more. Not painful.  Half-sister w/ breast cancer last yr - she is 58 yo.  Working in Engineer, technical sales for [employer]. Went to high school w/ Dr. Dolores Frame.  Past Medical History  Diagnosis Date  . Hypertension   . GERD (gastroesophageal reflux disease)    Past Surgical History  Procedure Laterality Date  . Humerus fracture surgery    . Fracture surgery     Current Outpatient Prescriptions on File Prior to Visit  Medication Sig Dispense Refill  . fluticasone (FLONASE) 50 MCG/ACT nasal spray Place 2 sprays into the nose daily.      Marland Kitchen lisinopril-hydrochlorothiazide (PRINZIDE,ZESTORETIC) 20-12.5 MG per tablet TAKE 1 TABLET BY MOUTH EVERY DAY  30 tablet  0  . loratadine (CLARITIN) 10 MG tablet Take 10 mg by mouth daily.      Marland Kitchen aspirin 81 MG tablet Take 81 mg by mouth daily.       No current facility-administered medications on file prior to visit.   No Known Allergies Family History  Problem Relation Age of Onset  . Hypertension Mother   . Heart disease Mother   . Hyperlipidemia Mother   . Heart disease Father   .  Hypertension Father   . Heart disease Maternal Grandmother   . Heart disease Maternal Grandfather   . Cancer Sister   . Stroke Brother   . Hypertension Brother    History   Social History  . Marital Status: Single    Spouse Name: N/A    Number of Children: N/A  . Years of Education: N/A   Occupational History  . IT    Social History Main Topics  . Smoking status: Current Every Day Smoker  . Smokeless tobacco: None  . Alcohol Use: No  . Drug Use: No  . Sexual Activity: No   Other Topics Concern  . None   Social History Narrative   Single. Education: [employer].    Review of Systems See above note for complete list    BP 130/72  Pulse 94  Temp(Src) 97.6 F (36.4 C) (Oral)  Resp 16  Ht 5\' 4"  (1.626 m)  Wt 248 lb (112.492 kg)  BMI 42.55 kg/m2  SpO2 97% Objective:   Physical Exam  Constitutional: She is oriented to person, place, and time. She appears well-developed and well-nourished. No distress.  HENT:  Head: Normocephalic and atraumatic.  Right Ear: Tympanic membrane, external ear and ear canal normal.  Left Ear: Tympanic membrane, external ear and ear canal normal.  Nose: No mucosal edema.  Mouth/Throat: Uvula is midline, oropharynx is clear and moist and mucous membranes are normal. No posterior oropharyngeal erythema.  Eyes: Conjunctivae and EOM are normal. Pupils are equal, round, and reactive to light. Right eye exhibits no discharge. Left eye exhibits no discharge. No scleral icterus.  Neck: Normal range of motion. Neck supple. No thyromegaly present.  Cardiovascular: Normal rate, regular rhythm, normal heart sounds and intact distal pulses.   Pulmonary/Chest: Effort normal and breath sounds normal. No respiratory distress.  Abdominal: Soft. Bowel sounds are normal. There is no hepatosplenomegaly. There is no tenderness. A hernia is present. Hernia confirmed positive in the ventral area.  Large ventral hernia - including and superior to umbilicus - approx  5-6 in dm, soft, nttp, reducible.  Genitourinary: Uterus normal. No breast swelling, tenderness, discharge or bleeding. There is no rash, tenderness or lesion on the right labia. There is no rash, tenderness or lesion on the left labia. Cervix exhibits no motion tenderness and no friability. Right adnexum displays no mass and no tenderness. Left adnexum displays no mass and no tenderness. No erythema or tenderness around the vagina. Vaginal discharge found.  + amine odor, moderate amount of thin white discharge  Musculoskeletal: She exhibits no edema.  Lymphadenopathy:    She has no cervical adenopathy.  Neurological: She is alert and oriented to person, place, and time. She has normal reflexes.  Skin: Skin is warm and dry. She is not diaphoretic. No erythema.  Psychiatric: She has a normal mood and affect. Her behavior is normal.          Assessment & Plan:  Routine general medical examination at a health care facility - Plan: CBC with Differential, Comprehensive metabolic panel, Vit D  25 hydroxy (rtn osteoporosis monitoring), Lipid panel, TSH, Pap IG and HPV (high risk) DNA detection, Hepatitis C antibody. No h/o abnml paps so ok to defer to 2019 if nml. Due for mammogram - pt will call herself [facility] sched w[facility]. Colonoscopy due 2019.  HTN (hypertension) - well controlled, lisinopril/hctz refilled.  Morbid obesity with BMI of 40.0-44.9, adult  Tobacco use disorder  Ventral hernia without obstruction or gangrene - Plan: Ambulatory referral to General Surgery per pt request - may needs plastics due to size of ventral hernia but unsure so will ask general surg to eval.  Unspecified vitamin D deficiency - found on routine labs today - start rx once wkly replacement x 6 mos then resume otc 1000-2000u/d. Rec check level at next visit after 6 mos of therapy.  Meds ordered this encounter  Medications  . lisinopril-hydrochlorothiazide (PRINZIDE,ZESTORETIC) 20-12.5 MG per tablet     Sig: TAKE 1 TABLET BY MOUTH EVERY DAY    Dispense:  90 tablet    Refill:  3  . fluticasone (FLONASE) 50 MCG/ACT nasal spray    Sig: Place 2 sprays into both nostrils daily.    Dispense:  16 g    Refill:  11  . ergocalciferol (VITAMIN D2) 50000 UNITS capsule    Sig: Take 1 capsule (50,000 Units total) by mouth once a week.    Dispense:  4 capsule    Refill:  5     Delman Cheadle, MD MPH

## 2013-12-10 NOTE — Patient Instructions (Addendum)
Remember to call the [facility] over at [facility] on [address] to schedule your mammogram NOW!!!! You are LONG OVERDUE - especially with your and your half-sister's history.  Keeping You Healthy  Get These Tests  Blood Pressure- Have your blood pressure checked by your healthcare provider at least once a year.  Normal blood pressure is 120/80.  Weight- Have your body mass index (BMI) calculated to screen for obesity.  BMI is a measure of body fat based on height and weight.  You can calculate your own BMI at [employer].it  Cholesterol- Have your cholesterol checked every year.  Diabetes- Have your blood sugar checked every year if you have high blood pressure, high cholesterol, a family history of diabetes or if you are overweight.  Pap Smear- Have a pap smear every 1 to 3 years if you have been sexually active.  If you are older than 65 and recent pap smears have been normal you may not need additional pap smears.  In addition, if you have had a hysterectomy  For benign disease additional pap smears are not necessary.  Mammogram-Yearly mammograms are essential for early detection of breast cancer  Screening for Colon Cancer- Colonoscopy starting at age 91. Screening may begin sooner depending on your family history and other health conditions.  Follow up colonoscopy as directed by your Gastroenterologist.  Screening for Osteoporosis- Screening begins at age 58 with bone density scanning, sooner if you are at higher risk for developing Osteoporosis.  Get these medicines  Calcium with Vitamin D- Your body requires 1200-1500 mg of Calcium a day and [phone] IU of Vitamin D a day.  You can only absorb 500 mg of Calcium at a time therefore Calcium must be taken in 2 or 3 separate doses throughout the day.  Hormones- Hormone therapy has been associated with increased risk for certain cancers and heart disease.  Talk to your healthcare provider about if you need relief  from menopausal symptoms.  Aspirin- Ask your healthcare provider about taking Aspirin to prevent Heart Disease and Stroke.  Get these Immuniztions  Flu shot- Every fall  Pneumonia shot- Once after the age of 42; if you are younger ask your healthcare provider if you need a pneumonia shot.  Tetanus- Every ten years.  Zostavax- Once after the age of 58 to prevent shingles.  Take these steps  Don't smoke- Your healthcare provider can help you quit. For tips on how to quit, ask your healthcare provider or go to www.smokefree.gov or call 1-800 QUIT-NOW.  Be physically active- Exercise 5 days a week for a minimum of 30 minutes.  If you are not already physically active, start slow and gradually work up to 30 minutes of moderate physical activity.  Try walking, dancing, bike riding, swimming, etc.  Eat a healthy diet- Eat a variety of healthy foods such as fruits, vegetables, whole grains, low fat milk, low fat cheeses, yogurt, lean meats, chicken, fish, eggs, dried beans, tofu, etc.  For more information go to www.thenutritionsource.org  Dental visit- Brush and floss teeth twice daily; visit your dentist twice a year.  Eye exam- Visit your Optometrist or Ophthalmologist yearly.  Drink alcohol in moderation- Limit alcohol intake to one drink or less a day.  Never drink and drive.  Depression- Your emotional health is as important as your physical health.  If you're feeling down or losing interest in things you normally enjoy, please talk to your healthcare provider.  Seat Belts- can save your life; always  wear one  Smoke/Carbon Monoxide detectors- These detectors need to be installed on the appropriate level of your home.  Replace batteries at least once a year.  Violence- If anyone is threatening or hurting you, please tell your healthcare provider.  Living Will/ Health care power of attorney- Discuss with your healthcare provider and family.

## 2013-12-11 ENCOUNTER — Encounter: Payer: Self-pay | Admitting: Family Medicine

## 2013-12-11 DIAGNOSIS — E559 Vitamin D deficiency, unspecified: Secondary | ICD-10-CM | POA: Insufficient documentation

## 2013-12-11 LAB — VITAMIN D 25 HYDROXY (VIT D DEFICIENCY, FRACTURES): Vit D, 25-Hydroxy: <10 ng/mL — ABNORMAL LOW (ref 30–89)

## 2013-12-11 MED ORDER — ERGOCALCIFEROL 1.25 MG (50000 UT) PO CAPS: 50000.0000 [IU] | ORAL_CAPSULE | ORAL | Status: DC

## 2013-12-13 LAB — PAP IG AND HPV HIGH-RISK

## 2013-12-26 ENCOUNTER — Encounter: Payer: Self-pay | Admitting: Family Medicine

## 2013-12-30 ENCOUNTER — Ambulatory Visit (INDEPENDENT_AMBULATORY_CARE_PROVIDER_SITE_OTHER): Payer: 59 | Admitting: General Surgery

## 2014-01-19 ENCOUNTER — Ambulatory Visit (INDEPENDENT_AMBULATORY_CARE_PROVIDER_SITE_OTHER): Payer: 59 | Admitting: General Surgery

## 2014-02-03 ENCOUNTER — Ambulatory Visit (INDEPENDENT_AMBULATORY_CARE_PROVIDER_SITE_OTHER): Payer: BC Managed Care – PPO | Admitting: General Surgery

## 2014-02-03 ENCOUNTER — Encounter (INDEPENDENT_AMBULATORY_CARE_PROVIDER_SITE_OTHER): Payer: Self-pay | Admitting: General Surgery

## 2014-02-03 VITALS — BP 132/82 | HR 86 | Temp 98.6°F | Resp 18 | Ht 64.0 in | Wt 248.2 lb

## 2014-02-03 DIAGNOSIS — K439 Ventral hernia without obstruction or gangrene: Secondary | ICD-10-CM | POA: Insufficient documentation

## 2014-02-03 NOTE — Progress Notes (Signed)
Patient ID: Vanessa Williamson, female   DOB: 1956/10/10, 58 y.o.   MRN: [id number]  Chief Complaint  Patient presents with  . Hernia    HPI Vanessa Williamson is a 58 y.o. female.   HPI 58 year old morbidly obese African American female referred by Dr. Brigitte Pulse for evaluation of ventral hernia. The patient states that she has had a hernia since around 2006. She states that she had an episode of multiple sneezing and she could feel something tearing in her abdomen. She has no pain or discomfort. The hernia has never been hard or swollen or tender to touch. She denies any nausea, vomiting, diarrhea or constipation. She denies any prior abdominal surgery. She does smoke about 1-1/2 packs of cigarettes per day.  Past Medical History  Diagnosis Date  . Hypertension   . GERD (gastroesophageal reflux disease)     Past Surgical History  Procedure Laterality Date  . Humerus fracture surgery    . Fracture surgery      Family History  Problem Relation Age of Onset  . Hypertension Mother   . Heart disease Mother   . Hyperlipidemia Mother   . Heart disease Father   . Hypertension Father   . Heart disease Maternal Grandmother   . Heart disease Maternal Grandfather   . Cancer Sister   . Stroke Brother   . Hypertension Brother     Social History History  Substance Use Topics  . Smoking status: Current Every Day Smoker  . Smokeless tobacco: Not on file  . Alcohol Use: Yes     Comment: rarely    No Known Allergies  Current Outpatient Prescriptions  Medication Sig Dispense Refill  . ergocalciferol (VITAMIN D2) 50000 UNITS capsule Take 1 capsule (50,000 Units total) by mouth once a week.  4 capsule  5  . fluticasone (FLONASE) 50 MCG/ACT nasal spray Place 2 sprays into both nostrils daily.  16 g  11  . lisinopril-hydrochlorothiazide (PRINZIDE,ZESTORETIC) 20-12.5 MG per tablet TAKE 1 TABLET BY MOUTH EVERY DAY  90 tablet  3  . loratadine (CLARITIN) 10 MG tablet Take 10 mg by mouth daily.       No  current facility-administered medications for this visit.    Review of Systems Review of Systems  Constitutional: Negative for fever, activity change, appetite change and unexpected weight change.  HENT: Negative for nosebleeds and trouble swallowing.   Eyes: Negative for photophobia and visual disturbance.  Respiratory: Negative for chest tightness and shortness of breath.   Cardiovascular: Negative for chest pain and leg swelling.       Denies CP, SOB, orthopnea, PND, some DOE  Genitourinary: Negative for dysuria and difficulty urinating.  Musculoskeletal: Negative for arthralgias.  Skin: Negative for pallor and rash.  Neurological: Negative for dizziness, seizures, facial asymmetry and numbness.       Denies TIA and amaurosis fugax   Hematological: Negative for adenopathy. Does not bruise/bleed easily.  Psychiatric/Behavioral: Negative for behavioral problems and agitation.    Blood pressure 132/82, pulse 86, temperature 98.6 F (37 C), temperature source Temporal, resp. rate 18, height 5\' 4"  (1.626 m), weight 248 lb 3.2 oz (112.583 kg).  Physical Exam Physical Exam  Vitals reviewed. Constitutional: She is oriented to person, place, and time. She appears well-developed and well-nourished. No distress.  Morbidly obese  HENT:  Head: Normocephalic and atraumatic.  Right Ear: External ear normal.  Left Ear: External ear normal.  Eyes: Conjunctivae are normal. No scleral icterus.  Neck: Normal range of  motion. Neck supple. No tracheal deviation present. No thyromegaly present.  Cardiovascular: Normal rate, normal heart sounds and intact distal pulses.   Pulmonary/Chest: Effort normal and breath sounds normal. No respiratory distress. She has no wheezes.  Abdominal: Soft. She exhibits no distension. There is no tenderness. There is no rebound and no guarding.    Obvious supraumbilical ventral hernia. Nontender. Partially reducible. Difficult to ascertain fascial defect size given  body habitus  Musculoskeletal: Normal range of motion. She exhibits no edema and no tenderness.  Lymphadenopathy:    She has no cervical adenopathy.  Neurological: She is alert and oriented to person, place, and time. She exhibits normal muscle tone.  Skin: Skin is warm and dry. No rash noted. She is not diaphoretic. No erythema. No pallor.  Psychiatric: She has a normal mood and affect. Her behavior is normal. Judgment and thought content normal.    Data Reviewed Dr Raul Del last office note  Assessment    Ventral hernia Tobacco use disorder Morbid obesity     Plan    We discussed the etiology of ventral hernias. We discussed the signs and symptoms of incarceration and strangulation. The patient was given educational material. I also drew diagrams.  We discussed nonoperative and operative management. With respect to operative management, we discussed both open repair and laparoscopic repair. We discussed the pros and cons of each approach. I discussed the typical aftercare with each procedure and how each procedure differs.  We discussed the risk and benefits of surgery including but not limited to bleeding, infection, injury to surrounding structures, hernia recurrence, mesh complications, hematoma/seroma formation, need to convert to an open procedure, blood clot formation, urinary retention, post operative ileus, general anesthesia risk, long-term abdominal pain. We discussed that this procedure can be quite uncomfortable and difficult to recover from based on how the mesh is secured to the abdominal wall. We discussed the importance of avoiding heavy lifting and straining for a period of 6 weeks.  I explained that in her current situation with her ongoing tobacco use and morbid obesity that her risk of recurrence and infection is very high compared to a normal weight nonsmoker. We discussed smoking cessation as well as weight loss in order to decrease her risk of infection as well as  long-term risk of recurrence of her hernia. Since she is asymptomatic I believe that is a viable option She should she try  first. I did explain that if she wished to proceed with surgery in the immediate future that I would recommend a laparoscopic approach in order to decrease her risk of wound infection because of her smoking use. The patient is planning on delaying surgery until early fall. She would like to try to lose weight and stop smoking. I think this is completely appropriate. She was encouraged to contact the office about 8 weeks before she was thinking about getting her hernia repaired in the fall. At that time we'll bring her back in for another office visit as well as arrange a CT scan of her abdomen and pelvis to further delineate her abdominal wall anatomy. We did discuss that if her defect is greater than 10 cm in size that in the long run she would have better results with an open repair but it would be imperative for her not to be smoking at the time of surgery.  Leighton Ruff. Redmond Pulling, MD, FACS General, Bariatric, & Minimally Invasive Surgery [facility], [place]  Greer Pickerel M 02/03/2014, 9:29 AM

## 2014-02-03 NOTE — Patient Instructions (Signed)
Work on stopping smoking. Talk with your PCP about stopping smoking Work on exercise and diet and try to lose weight. Aim for 10,000 steps a day Call about 8 weeks before you would like to proceed with hernia repair

## 2015-01-31 ENCOUNTER — Other Ambulatory Visit: Payer: Self-pay | Admitting: Family Medicine

## 2015-03-20 ENCOUNTER — Ambulatory Visit (INDEPENDENT_AMBULATORY_CARE_PROVIDER_SITE_OTHER): Payer: BLUE CROSS/BLUE SHIELD | Admitting: Family Medicine

## 2015-03-20 VITALS — BP 150/80 | HR 102 | Temp 97.8°F | Resp 18 | Ht 64.0 in | Wt 267.0 lb

## 2015-03-20 DIAGNOSIS — M7711 Lateral epicondylitis, right elbow: Secondary | ICD-10-CM

## 2015-03-20 DIAGNOSIS — Z720 Tobacco use: Secondary | ICD-10-CM | POA: Diagnosis not present

## 2015-03-20 DIAGNOSIS — F17200 Nicotine dependence, unspecified, uncomplicated: Secondary | ICD-10-CM

## 2015-03-20 DIAGNOSIS — I10 Essential (primary) hypertension: Secondary | ICD-10-CM

## 2015-03-20 LAB — POCT URINALYSIS DIPSTICK
Bilirubin, UA: NEGATIVE
Blood, UA: NEGATIVE
GLUCOSE UA: NEGATIVE
KETONES UA: NEGATIVE
LEUKOCYTES UA: NEGATIVE
Nitrite, UA: NEGATIVE
PROTEIN UA: NEGATIVE
SPEC GRAV UA: 1.02
UROBILINOGEN UA: 0.2
pH, UA: 5

## 2015-03-20 LAB — POCT UA - MICROSCOPIC ONLY
BACTERIA, U MICROSCOPIC: NEGATIVE
CASTS, UR, LPF, POC: NEGATIVE
Crystals, Ur, HPF, POC: NEGATIVE
MUCUS UA: NEGATIVE
RBC, urine, microscopic: NEGATIVE
WBC, Ur, HPF, POC: NEGATIVE
Yeast, UA: NEGATIVE

## 2015-03-20 MED ORDER — LISINOPRIL-HYDROCHLOROTHIAZIDE 20-12.5 MG PO TABS
ORAL_TABLET | ORAL | Status: DC
Start: 2015-03-20 — End: 2015-10-21

## 2015-03-20 MED ORDER — BUPROPION HCL ER (SMOKING DET) 150 MG PO TB12: 150.0000 mg | ORAL_TABLET | Freq: Two times a day (BID) | ORAL | Status: DC

## 2015-03-20 MED ORDER — LISINOPRIL-HYDROCHLOROTHIAZIDE 20-12.5 MG PO TABS
1.0000 | ORAL_TABLET | Freq: Every day | ORAL | Status: DC
Start: 2015-03-20 — End: 2015-10-21

## 2015-03-20 NOTE — Progress Notes (Signed)
03/20/2015 at 3:55 PM  Vanessa Williamson / DOB: June 16, 1956 / MRN: [id number]  The patient has HTN (hypertension); GERD (gastroesophageal reflux disease); Allergic rhinitis; COPD (chronic obstructive pulmonary disease); Morbid obesity with BMI of 40.0-44.9, adult; Tobacco use disorder; Unspecified vitamin D deficiency; and Ventral hernia on Vanessa Williamson problem list.  SUBJECTIVE  Chief complaint: Elbow Pain and Medication Refill   History of present illness: Vanessa Williamson is 59 y.o. well appearing female presenting for medication refills and elbow pain.   Vanessa Williamson would like a refill of Vanessa Williamson HTN meds today.  Vanessa Williamson will need a short term prescription as well as 6 moth refill via Vanessa Williamson mail order pharmacy.  Vanessa Williamson hypertension has been well controlled and Vanessa Williamson is compliant with daily medication therapy.  Vanessa Williamson does not check Vanessa Williamson BP outside of [facility].   Vanessa Williamson complains of right lateral epicondylar pain due to bumping Vanessa Williamson arm roughly two weeks ago.  Vanessa Williamson denies pain today.  Vanessa Williamson has been taking ibuprofen 600-800 mg daily with good relief of the pain.  Vanessa Williamson has not tried an arm wrap yet.    Vanessa Williamson has been smoking 1.5 packs daily since Vanessa Williamson was 59 yo.  Vanessa Williamson would like to try a medication to aid Vanessa Williamson in smoking cessation today.      Vanessa Williamson  has a past medical history of Hypertension and GERD (gastroesophageal reflux disease).    Vanessa Williamson has a current medication list which includes the following prescription(s): lisinopril-hydrochlorothiazide, lisinopril-hydrochlorothiazide, loratadine, and bupropion.  Vanessa Williamson has No Known Allergies. Vanessa Williamson  reports that Vanessa Williamson has been smoking.  Vanessa Williamson does not have any smokeless tobacco history on file. Vanessa Williamson reports that Vanessa Williamson drinks alcohol. Vanessa Williamson reports that Vanessa Williamson does not use illicit drugs. Vanessa Williamson  reports that Vanessa Williamson does not engage in sexual activity. The patient  has past surgical history that includes Humerus fracture surgery and Fracture surgery.  Vanessa Williamson family history includes Cancer in Vanessa Williamson sister; Heart disease in Vanessa Williamson father,  maternal grandfather, maternal grandmother, and mother; Hyperlipidemia in Vanessa Williamson mother; Hypertension in Vanessa Williamson brother, father, and mother; Stroke in Vanessa Williamson brother.  Review of Systems  Eyes: Negative.   Respiratory: Negative for shortness of breath.   Cardiovascular: Negative for chest pain, palpitations, orthopnea and leg swelling.  Gastrointestinal: Negative for heartburn, nausea, vomiting, abdominal pain, diarrhea and constipation.  Genitourinary: Negative.  Negative for hematuria.  Neurological: Negative for headaches.    OBJECTIVE  Vanessa Williamson  height is 5\' 4"  (1.626 m) and weight is 267 lb (121.11 kg). Vanessa Williamson oral temperature is 97.8 F (36.6 C). Vanessa Williamson blood pressure is 150/80 and Vanessa Williamson pulse is 102. Vanessa Williamson respiration is 18 and oxygen saturation is 95%.  The patient's body mass index is 45.81 kg/(m^2). BP rechecked by me on exam at 118/60.    Physical Exam  Constitutional: Vanessa Williamson is oriented to person, place, and time. Vanessa Williamson appears well-developed and well-nourished.  Cardiovascular: Normal rate and regular rhythm.   Respiratory: Effort normal and breath sounds normal.  GI: Soft.  Musculoskeletal: Normal range of motion.       Right elbow: Vanessa Williamson exhibits normal range of motion, no swelling, no effusion, no deformity and no laceration. Tenderness found. Lateral epicondyle tenderness noted. No radial head, no medial epicondyle and no olecranon process tenderness noted.       Arms: Neurological: Vanessa Williamson is alert and oriented to person, place, and time. No cranial nerve deficit.  Skin: Skin is warm and dry.  Psychiatric: Vanessa Williamson has a normal mood and affect.  No results found for this or any previous visit (from the past 24 hour(s)).  ASSESSMENT & PLAN  Takira was seen today for elbow pain and medication refill.  Diagnoses and all orders for this visit:  Essential hypertension: Well controlled.  Will maintain current medication dosage.   Orders: -     lisinopril-hydrochlorothiazide (PRINZIDE,ZESTORETIC) 20-12.5 MG  per tablet; TAKE 1 TABLET BY MOUTH EVERY DAY -     lisinopril-hydrochlorothiazide (PRINZIDE,ZESTORETIC) 20-12.5 MG per tablet; Take 1 tablet by mouth daily. PATIENT NEEDS OFFICE VISIT FOR ADDITIONAL REFILLS -     TSH -     POCT urinalysis dipstick -     POCT UA - Microscopic Only -     CBC with Differential/Platelet -     Vitamin D 1,25 dihydroxy -     EKG 12-Lead -     Comprehensive metabolic panel  Tobacco use disorder: Patient motivated to quit today.  Does not want to try chantix 2/2 risk of nightmares.  Will try the below.  Patient has enough medication to last 12 weeks.  Vanessa Williamson is to cut back Vanessa Williamson cigarette use by three a day once starting the medication.   Orders: -     buPROPion (ZYBAN) 150 MG 12 hr tablet; Take 1 tablet (150 mg total) by mouth 2 (two) times daily.  Lateral epicondylitis (tennis elbow), right: Patient advised to continue Vanessa Williamson pain regimen and purchase an arm wrap from Vanessa Williamson pharmacy.     The patient was advised to call or come back to clinic if Vanessa Williamson does not see an improvement in symptoms, or worsens with the above plan.   Philis Fendt, MHS, PA-C [facility] 03/20/2015 3:55 PM

## 2015-03-20 NOTE — Patient Instructions (Signed)
Cut down on your cigarette use by 10% daily once starting the medication.

## 2015-03-21 LAB — CBC WITH DIFFERENTIAL/PLATELET
BASOS ABS: 0 10*3/uL (ref 0.0–0.1)
BASOS PCT: 0 % (ref 0–1)
EOS ABS: 0.1 10*3/uL (ref 0.0–0.7)
Eosinophils Relative: 2 % (ref 0–5)
HEMATOCRIT: 35.9 % — AB (ref 36.0–46.0)
HEMOGLOBIN: 11.9 g/dL — AB (ref 12.0–15.0)
LYMPHS PCT: 34 % (ref 12–46)
Lymphs Abs: 1.2 10*3/uL (ref 0.7–4.0)
MCH: 32.6 pg (ref 26.0–34.0)
MCHC: 33.1 g/dL (ref 30.0–36.0)
MCV: 98.4 fL (ref 78.0–100.0)
MONO ABS: 0.2 10*3/uL (ref 0.1–1.0)
MPV: 9.7 fL (ref 8.6–12.4)
Monocytes Relative: 7 % (ref 3–12)
NEUTROS ABS: 1.9 10*3/uL (ref 1.7–7.7)
Neutrophils Relative %: 57 % (ref 43–77)
PLATELETS: 177 10*3/uL (ref 150–400)
RBC: 3.65 MIL/uL — ABNORMAL LOW (ref 3.87–5.11)
RDW: 13.3 % (ref 11.5–15.5)
WBC: 3.4 10*3/uL — ABNORMAL LOW (ref 4.0–10.5)

## 2015-03-21 LAB — TSH: TSH: 0.501 u[IU]/mL (ref 0.350–4.500)

## 2015-03-24 LAB — VITAMIN D 1,25 DIHYDROXY
VITAMIN D 1, 25 (OH) TOTAL: 49 pg/mL (ref 18–72)
Vitamin D2 1, 25 (OH)2: 13 pg/mL
Vitamin D3 1, 25 (OH)2: 36 pg/mL

## 2015-04-16 ENCOUNTER — Other Ambulatory Visit: Payer: Self-pay | Admitting: Physician Assistant

## 2015-05-05 ENCOUNTER — Encounter: Payer: Self-pay | Admitting: Gastroenterology

## 2015-06-05 ENCOUNTER — Encounter: Payer: Self-pay | Admitting: *Deleted

## 2015-07-24 ENCOUNTER — Ambulatory Visit (INDEPENDENT_AMBULATORY_CARE_PROVIDER_SITE_OTHER): Payer: BLUE CROSS/BLUE SHIELD | Admitting: Emergency Medicine

## 2015-07-24 VITALS — BP 118/68 | HR 77 | Temp 97.4°F | Resp 18 | Ht 64.0 in | Wt 254.2 lb

## 2015-07-24 DIAGNOSIS — H6981 Other specified disorders of Eustachian tube, right ear: Secondary | ICD-10-CM | POA: Diagnosis not present

## 2015-07-24 MED ORDER — MOMETASONE FUROATE 50 MCG/ACT NA SUSP: 2.0000 | Freq: Every day | NASAL | Status: DC

## 2015-07-24 NOTE — Patient Instructions (Signed)
Barotitis Media Barotitis media is inflammation of your middle ear. This occurs when the auditory tube (eustachian tube) leading from the back of your nose (nasopharynx) to your eardrum is blocked. This blockage may result from a cold, environmental allergies, or an upper respiratory infection. Unresolved barotitis media may lead to damage or hearing loss (barotrauma), which may become permanent. HOME CARE INSTRUCTIONS   Use medicines as recommended by your health care provider. Over-the-counter medicines will help unblock the canal and can help during times of air travel.  Do not put anything into your ears to clean or unplug them. Eardrops will not be helpful.  Do not swim, dive, or fly until your health care provider says it is all right to do so. If these activities are necessary, chewing gum with frequent, forceful swallowing may help. It is also helpful to hold your nose and gently blow to pop your ears for equalizing pressure changes. This forces air into the eustachian tube.  Only take over-the-counter or prescription medicines for pain, discomfort, or fever as directed by your health care provider.  A decongestant may be helpful in decongesting the middle ear and make pressure equalization easier. SEEK MEDICAL CARE IF:  You experience a serious form of dizziness in which you feel as if the room is spinning and you feel nauseated (vertigo).  Your symptoms only involve one ear. SEEK IMMEDIATE MEDICAL CARE IF:   You develop a severe headache, dizziness, or severe ear pain.  You have bloody or pus-like drainage from your ears.  You develop a fever.  Your problems do not improve or become worse. MAKE SURE YOU:   Understand these instructions.  Will watch your condition.  Will get help right away if you are not doing well or get worse. Document Released: 12/06/2000 Document Revised: 09/29/2013 Document Reviewed: 07/06/2013 ExitCare Patient Information 2015 [employer]. This  information is not intended to replace advice given to you by your health care provider. Make sure you discuss any questions you have with your health care provider.  

## 2015-07-24 NOTE — Progress Notes (Signed)
Subjective:  Patient ID: Vanessa Williamson, female    DOB: [birth year]  Age: 59 y.o. MRN: [id number]  CC: Lab Work and Ear Pain   HPI Vanessa Williamson presents  with pain in her right ear over the last 3 weeks. She said over the last several days pain has dramatically increased. She has no fever chills cough coryza no nasal discharge postnasal drainage no drainage from her ear. She has no history of antecedent illness or injury. She's had no improvement with over-the-counter medication  History Vanessa Williamson has a past medical history of Hypertension and GERD (gastroesophageal reflux disease).   She has past surgical history that includes Humerus fracture surgery and Fracture surgery.   Her  family history includes Cancer in her sister; Heart disease in her father, maternal grandfather, maternal grandmother, and mother; Hyperlipidemia in her mother; Hypertension in her brother, father, and mother; Stroke in her brother.  She   reports that she has been smoking.  She does not have any smokeless tobacco history on file. She reports that she drinks alcohol. She reports that she does not use illicit drugs.  Outpatient Prescriptions Prior to Visit  Medication Sig Dispense Refill  . buPROPion (ZYBAN) 150 MG 12 hr tablet Take 1 tablet (150 mg total) by mouth 2 (two) times daily. 60 tablet 3  . lisinopril-hydrochlorothiazide (PRINZIDE,ZESTORETIC) 20-12.5 MG per tablet TAKE 1 TABLET BY MOUTH EVERY DAY 90 tablet 1  . lisinopril-hydrochlorothiazide (PRINZIDE,ZESTORETIC) 20-12.5 MG per tablet Take 1 tablet by mouth daily. PATIENT NEEDS OFFICE VISIT FOR ADDITIONAL REFILLS 30 tablet 0  . loratadine (CLARITIN) 10 MG tablet Take 10 mg by mouth daily.     No facility-administered medications prior to visit.    History   Social History  . Marital Status: Single    Spouse Name: N/A  . Number of Children: N/A  . Years of Education: N/A   Occupational History  . IT    Social History Main Topics  . Smoking  status: Current Every Day Smoker  . Smokeless tobacco: Not on file  . Alcohol Use: Yes     Comment: rarely  . Drug Use: No  . Sexual Activity: No   Other Topics Concern  . None   Social History Narrative   Single. Education: [employer].     Review of Systems  Constitutional: Negative for fever, chills and appetite change.  HENT: Positive for ear pain. Negative for congestion, ear discharge, postnasal drip, sinus pressure and sore throat.   Eyes: Negative for pain and redness.  Respiratory: Negative for cough, shortness of breath and wheezing.   Cardiovascular: Negative for leg swelling.  Gastrointestinal: Negative for nausea, vomiting, abdominal pain, diarrhea, constipation and blood in stool.  Endocrine: Negative for polyuria.  Genitourinary: Negative for dysuria, urgency, frequency and flank pain.  Musculoskeletal: Negative for gait problem.  Skin: Negative for rash.  Neurological: Negative for weakness and headaches.  Psychiatric/Behavioral: Negative for confusion and decreased concentration. The patient is not nervous/anxious.     Objective:  BP 118/68 mmHg  Pulse 77  Temp(Src) 97.4 F (36.3 C) (Oral)  Resp 18  Ht 5\' 4"  (1.626 m)  Wt 254 lb 3.2 oz (115.304 kg)  BMI 43.61 kg/m2  SpO2 97%  Physical Exam  Constitutional: She is oriented to person, place, and time. She appears well-developed and well-nourished.  HENT:  Head: Normocephalic and atraumatic.  Right Ear: External ear normal.  Left Ear: External ear normal.  Nose: Nose normal.  Mouth/Throat: Oropharynx is  clear and moist. No oropharyngeal exudate.  Eyes: Conjunctivae are normal. Pupils are equal, round, and reactive to light.  Neck: Normal range of motion. Neck supple.  Cardiovascular: Normal rate and regular rhythm.   Pulmonary/Chest: Effort normal and breath sounds normal.  Musculoskeletal: She exhibits no edema.  Neurological: She is alert and oriented to person, place, and time.  Skin: Skin is dry.    Psychiatric: She has a normal mood and affect. Her behavior is normal. Thought content normal.   She is unable to Valsalva with her right ear.   Assessment & Plan:   Vanessa Williamson was seen today for lab work and ear pain.  Diagnoses and all orders for this visit:  Eustachian tube dysfunction, right  Other orders -     mometasone (NASONEX) 50 MCG/ACT nasal spray; Place 2 sprays into the nose daily.   I suggested she try using some Mucinex D for the next couple days watching her blood pressure.  I am having [name] start on mometasone. I am also having her maintain her loratadine, lisinopril-hydrochlorothiazide, lisinopril-hydrochlorothiazide, and buPROPion.  Meds ordered this encounter  Medications  . mometasone (NASONEX) 50 MCG/ACT nasal spray    Sig: Place 2 sprays into the nose daily.    Dispense:  17 g    Refill:  12    Appropriate red flag conditions were discussed with the patient as well as actions that should be taken.  Patient expressed his understanding.  Follow-up: Return if symptoms worsen or fail to improve.  Roselee Culver, MD

## 2015-10-21 ENCOUNTER — Ambulatory Visit (INDEPENDENT_AMBULATORY_CARE_PROVIDER_SITE_OTHER): Payer: BLUE CROSS/BLUE SHIELD | Admitting: Physician Assistant

## 2015-10-21 VITALS — BP 110/60 | HR 102 | Temp 97.8°F | Resp 20 | Ht 64.0 in | Wt 250.4 lb

## 2015-10-21 DIAGNOSIS — I10 Essential (primary) hypertension: Secondary | ICD-10-CM

## 2015-10-21 DIAGNOSIS — T2102XA Burn of unspecified degree of abdominal wall, initial encounter: Secondary | ICD-10-CM | POA: Diagnosis not present

## 2015-10-21 DIAGNOSIS — T300 Burn of unspecified body region, unspecified degree: Secondary | ICD-10-CM

## 2015-10-21 MED ORDER — LISINOPRIL-HYDROCHLOROTHIAZIDE 20-12.5 MG PO TABS: ORAL_TABLET | ORAL | Status: DC

## 2015-10-21 MED ORDER — LISINOPRIL-HYDROCHLOROTHIAZIDE 20-12.5 MG PO TABS: 1.0000 | ORAL_TABLET | Freq: Every day | ORAL | Status: DC

## 2015-10-21 MED ORDER — MUPIROCIN 2 % EX OINT: 1.0000 | TOPICAL_OINTMENT | Freq: Two times a day (BID) | CUTANEOUS | Status: DC

## 2015-10-21 NOTE — Patient Instructions (Signed)
I've [facility] pressure [facility] and for 5 more months to your mail order pharmacy.  We are checking your kidneys and electrolytes today.  We applied a topical to the burn today.  I've sent an antibiotic topical into the pharmacy. Please apply this topical twice daily to the wound for one week.  Keep the wound covered when you're out and about but try to keep the wound open as often as possible when home.  Please come back to see [place] in 1-2 weeks if the wound is not healing. Please come back sooner with any expanding redness or pus draining from the wound.
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
## 2015-10-22 ENCOUNTER — Encounter: Payer: Self-pay | Admitting: Physician Assistant

## 2015-10-22 LAB — BASIC METABOLIC PANEL
BUN: 19 mg/dL (ref 7–25)
CO2: 26 mmol/L (ref 20–31)
Calcium: 8.8 mg/dL (ref 8.6–10.4)
Chloride: 105 mmol/L (ref 98–110)
Creat: 1.1 mg/dL — ABNORMAL HIGH (ref 0.50–0.99)
Glucose, Bld: 103 mg/dL — ABNORMAL HIGH (ref 65–99)
POTASSIUM: 4.2 mmol/L (ref 3.5–5.3)
SODIUM: 138 mmol/L (ref 135–146)

## 2015-10-23 ENCOUNTER — Telehealth: Payer: Self-pay

## 2015-10-23 DIAGNOSIS — T300 Burn of unspecified body region, unspecified degree: Secondary | ICD-10-CM

## 2015-10-23 NOTE — Telephone Encounter (Signed)
Patient would like [facility] [address] [facility] of being sent through the mail order service [facility]. She states that she wants to start using this medication right away.   [phone]

## 2015-10-24 MED ORDER — MUPIROCIN 2 % EX OINT: 1.0000 "application " | TOPICAL_OINTMENT | Freq: Two times a day (BID) | CUTANEOUS | Status: DC

## 2015-10-24 NOTE — Telephone Encounter (Signed)
Sent to [address]. Left message letting pt know.

## 2016-04-06 ENCOUNTER — Ambulatory Visit (INDEPENDENT_AMBULATORY_CARE_PROVIDER_SITE_OTHER): Payer: 59 | Admitting: Osteopathic Medicine

## 2016-04-06 VITALS — BP 130/70 | HR 82 | Temp 97.9°F | Resp 16 | Ht 64.0 in | Wt 232.0 lb

## 2016-04-06 DIAGNOSIS — R7989 Other specified abnormal findings of blood chemistry: Secondary | ICD-10-CM

## 2016-04-06 DIAGNOSIS — I10 Essential (primary) hypertension: Secondary | ICD-10-CM | POA: Diagnosis not present

## 2016-04-06 DIAGNOSIS — L500 Allergic urticaria: Secondary | ICD-10-CM | POA: Diagnosis not present

## 2016-04-06 DIAGNOSIS — R748 Abnormal levels of other serum enzymes: Secondary | ICD-10-CM

## 2016-04-06 LAB — COMPLETE METABOLIC PANEL WITH GFR
ALT: 4 U/L — ABNORMAL LOW (ref 6–29)
AST: 10 U/L (ref 10–35)
Albumin: 4 g/dL (ref 3.6–5.1)
Alkaline Phosphatase: 48 U/L (ref 33–130)
BUN: 18 mg/dL (ref 7–25)
CHLORIDE: 105 mmol/L (ref 98–110)
CO2: 25 mmol/L (ref 20–31)
Calcium: 9.2 mg/dL (ref 8.6–10.4)
Creat: 1.1 mg/dL — ABNORMAL HIGH (ref 0.50–0.99)
GFR, Est African American: 63 mL/min (ref >=60)
GFR, Est Non African American: 55 mL/min — ABNORMAL LOW (ref >=60)
Glucose, Bld: 90 mg/dL (ref 65–99)
POTASSIUM: 5 mmol/L (ref 3.5–5.3)
Sodium: 140 mmol/L (ref 135–146)
Total Bilirubin: 0.4 mg/dL (ref 0.2–1.2)
Total Protein: 7 g/dL (ref 6.1–8.1)

## 2016-04-06 MED ORDER — HYDROCHLOROTHIAZIDE 25 MG PO TABS: 25.0000 mg | ORAL_TABLET | Freq: Every day | ORAL | Status: DC

## 2016-04-06 MED ORDER — HYDROXYZINE HCL 10 MG PO TABS: 5.0000 mg | ORAL_TABLET | Freq: Three times a day (TID) | ORAL | Status: DC | PRN

## 2016-04-06 NOTE — Progress Notes (Signed)
HPI: Vanessa Williamson is a 60 y.o. female who presents to [facility] today for chief complaint of:  Chief Complaint  Patient presents with  . Medication Refill    blood pressure  . Medication Problem    MEDICATION PROBLEM  . Location: facial puffiness on one side or another - not consistent on one side, can't correlate to sleeping position . Quality: swelling/puffy, nonpainful  . Timing: in mornings mostly . Context: thinks may be due to her BP meds, would like to get off ACEI . Assoc signs/symptoms: no lip swelling or trouble breathing, (+) coughing but that's chronic - is a smoker   HYPERTENSION - controlled but concern for med problem as above. No CP/SOB.   SKIN - hives in the mornings usually, not always every morning, have gone away now but itching. No bite marks. No bedbugs she knows of. No new exposures to skin.    Past medical, social and family history reviewed: Past Medical History  Diagnosis Date  . Hypertension   . GERD (gastroesophageal reflux disease)    Past Surgical History  Procedure Laterality Date  . Humerus fracture surgery    . Fracture surgery     Social History  Substance Use Topics  . Smoking status: Current Every Day Smoker  . Smokeless tobacco: Not on file  . Alcohol Use: Yes     Comment: rarely   Family History  Problem Relation Age of Onset  . Hypertension Mother   . Heart disease Mother   . Hyperlipidemia Mother   . Heart disease Father   . Hypertension Father   . Heart disease Maternal Grandmother   . Heart disease Maternal Grandfather   . Cancer Sister   . Stroke Brother   . Hypertension Brother     Current Outpatient Prescriptions  Medication Sig Dispense Refill  . buPROPion (ZYBAN) 150 MG 12 hr tablet Take 1 tablet (150 mg total) by mouth 2 (two) times daily. 60 tablet 3  . lisinopril-hydrochlorothiazide (PRINZIDE,ZESTORETIC) 20-12.5 MG tablet Take 1 tablet by mouth daily. 150 tablet 0  . mometasone  (NASONEX) 50 MCG/ACT nasal spray Place 2 sprays into the nose daily. 17 g 12  . lisinopril-hydrochlorothiazide (PRINZIDE,ZESTORETIC) 20-12.5 MG tablet TAKE 1 TABLET BY MOUTH EVERY DAY 30 tablet 0  . mupirocin ointment (BACTROBAN) 2 % Apply 1 application topically 2 (two) times daily. Apply small amount to the burned area twice daily for the next week. (Patient not taking: Reported on 04/06/2016) 22 g 1   No current facility-administered medications for this visit.   No Known Allergies    Review of Systems: CONSTITUTIONAL:  No  fever, no chills, no recent illness HEAD/EYES/EARS/NOSE/THROAT: No  headache, no vision change, no throat-closing sensation CARDIAC: No  chest pain, No  pressure, No palpitations, No  Orthopnea, no lower extremity swelling RESPIRATORY: No  cough, No  shortness of breath MUSCULOSKELETAL: No  myalgia/arthralgia SKIN: No  rash/wounds/concerning lesions, (+) hives which have gone away at this point   Exam:  BP 130/70 mmHg  Pulse 82  Temp(Src) 97.9 F (36.6 C) (Oral)  Resp 16  Ht 5\' 4"  (1.626 m)  Wt 232 lb (105.235 kg)  BMI 39.80 kg/m2  SpO2 96% Constitutional: VS see above. General Appearance: alert, well-developed, well-nourished, NAD Eyes: Normal lids and conjunctive, non-icteric sclera Ears, Nose, Mouth, Throat: MMM, Normal external inspection ears/nares/mouth/lips/gums, no swelling in lips, no edema in pharynx/uvula Neck: No masses, trachea midline. No lymphadenopathy Respiratory: Normal respiratory  effort. no wheeze, no rhonchi, no rales Cardiovascular: S1/S2 normal, no murmur, no rub/gallop auscultated. RRR. No lower extremity edema. Skin: warm, dry, intact. No rash/ulcer. No concerning nevi or subq nodules on limited exam.   Psychiatric: Normal judgment/insight. Normal mood and affect.      ASSESSMENT/PLAN: Symptoms not consistent with angioedema but since patient would rather not be on ACE we can try without it. Increase HCTZ and RTC for recheck,  ER/RTC precautions reviewed. Skin normal at this time, symptoms sound like allergic contact dermatitis or idiopathic urticaria or other - pt advised take photos, can come in for biopsy or exam if persist or get worse. Patient has been educated on significant possible side effects of medication and is instructed to contact me or other medical professional with any concerns about side effects.    Essential hypertension - Plan: hydrochlorothiazide (HYDRODIURIL) 25 MG tablet, COMPLETE METABOLIC PANEL WITH GFR  Allergic urticaria - Plan: hydrOXYzine (ATARAX/VISTARIL) 10 MG tablet  Elevated serum creatinine  Visit summary printed and instructions reviewed with the patient. All questions answered. Return in about 3 weeks (around 04/27/2016), or sooner if needed, for BP RECHECK ON NEW MEDICATION.

## 2016-04-06 NOTE — Patient Instructions (Addendum)
Will recheck blood work today and call you with results.  Plan to come back for blood pressure recheck in 2 - 3 weeks or when your medications are running low.  If itching/swelling persists or gets worse, especially if you have lip/mouth swelling or feeling like your throat is closing and/or you can't breathe - that is a medical emergency which needs care right away.  Take photos of the swelling/hives when you get these so a doctor can review these next time.  Any questions or concerns, please let us know!     IF you received an x-ray today, you will receive an invoice from [facility]. Please contact [facility] at [phone] with questions or concerns regarding your invoice.   IF you received labwork today, you will receive an invoice from [employer]. Please contact [facility] at [phone] with questions or concerns regarding your invoice.   Our billing staff will not be able to assist you with questions regarding bills from these companies.  You will be contacted with the lab results as soon as they are available. The fastest way to get your results is to activate your My Chart account. Instructions are located on the last page of this paperwork. If you have not heard from [place] regarding the results in 2 weeks, please contact this office.

## 2016-04-10 ENCOUNTER — Encounter: Payer: Self-pay | Admitting: *Deleted

## 2016-06-07 ENCOUNTER — Other Ambulatory Visit: Payer: Self-pay | Admitting: Osteopathic Medicine

## 2016-07-19 ENCOUNTER — Ambulatory Visit (INDEPENDENT_AMBULATORY_CARE_PROVIDER_SITE_OTHER): Payer: 59 | Admitting: Urgent Care

## 2016-07-19 VITALS — BP 124/70 | HR 85 | Temp 98.0°F | Resp 18 | Ht 64.0 in | Wt 230.0 lb

## 2016-07-19 DIAGNOSIS — I10 Essential (primary) hypertension: Secondary | ICD-10-CM

## 2016-07-19 MED ORDER — HYDROCHLOROTHIAZIDE 25 MG PO TABS: 25.0000 mg | ORAL_TABLET | Freq: Every day | ORAL | 3 refills | Status: DC

## 2016-07-19 NOTE — Patient Instructions (Addendum)
Hypertension Hypertension, commonly called high blood pressure, is when the force of blood pumping through your arteries is too strong. Your arteries are the blood vessels that carry blood from your heart throughout your body. A blood pressure reading consists of a higher number over a lower number, such as 110/72. The higher number (systolic) is the pressure inside your arteries when your heart pumps. The lower number (diastolic) is the pressure inside your arteries when your heart relaxes. Ideally you want your blood pressure below 120/80. Hypertension forces your heart to work harder to pump blood. Your arteries may become narrow or stiff. Having untreated or uncontrolled hypertension can cause heart attack, stroke, kidney disease, and other problems. RISK FACTORS Some risk factors for high blood pressure are controllable. Others are not.  Risk factors you cannot control include:   Race. You may be at higher risk if you are African American.  Age. Risk increases with age.  Gender. Men are at higher risk than women before age 45 years. After age 65, women are at higher risk than men. Risk factors you can control include:  Not getting enough exercise or physical activity.  Being overweight.  Getting too much fat, sugar, calories, or salt in your diet.  Drinking too much alcohol. SIGNS AND SYMPTOMS Hypertension does not usually cause signs or symptoms. Extremely high blood pressure (hypertensive crisis) may cause headache, anxiety, shortness of breath, and nosebleed. DIAGNOSIS To check if you have hypertension, your health care provider will measure your blood pressure while you are seated, with your arm held at the level of your heart. It should be measured at least twice using the same arm. Certain conditions can cause a difference in blood pressure between your right and left arms. A blood pressure reading that is higher than normal on one occasion does not mean that you need treatment. If  it is not clear whether you have high blood pressure, you may be asked to return on a different day to have your blood pressure checked again. Or, you may be asked to monitor your blood pressure at home for 1 or more weeks. TREATMENT Treating high blood pressure includes making lifestyle changes and possibly taking medicine. Living a healthy lifestyle can help lower high blood pressure. You may need to change some of your habits. Lifestyle changes may include:  Following the DASH diet. This diet is high in fruits, vegetables, and whole grains. It is low in salt, red meat, and added sugars.  Keep your sodium intake below 2,300 mg per day.  Getting at least 30-45 minutes of aerobic exercise at least 4 times per week.  Losing weight if necessary.  Not smoking.  Limiting alcoholic beverages.  Learning ways to reduce stress. Your health care provider may prescribe medicine if lifestyle changes are not enough to get your blood pressure under control, and if one of the following is true:  You are 18-59 years of age and your systolic blood pressure is above 140.  You are 60 years of age or older, and your systolic blood pressure is above 150.  Your diastolic blood pressure is above 90.  You have diabetes, and your systolic blood pressure is over 140 or your diastolic blood pressure is over 90.  You have kidney disease and your blood pressure is above 140/90.  You have heart disease and your blood pressure is above 140/90. Your personal target blood pressure may vary depending on your medical conditions, your age, and other factors. HOME CARE INSTRUCTIONS    Have your blood pressure rechecked as directed by your health care provider.   Take medicines only as directed by your health care provider. Follow the directions carefully. Blood pressure medicines must be taken as prescribed. The medicine does not work as well when you skip doses. Skipping doses also puts you at risk for  problems.  Do not smoke.   Monitor your blood pressure at home as directed by your health care provider. SEEK MEDICAL CARE IF:   You think you are having a reaction to medicines taken.  You have recurrent headaches or feel dizzy.  You have swelling in your ankles.  You have trouble with your vision. SEEK IMMEDIATE MEDICAL CARE IF:  You develop a severe headache or confusion.  You have unusual weakness, numbness, or feel faint.  You have severe chest or abdominal pain.  You vomit repeatedly.  You have trouble breathing. MAKE SURE YOU:   Understand these instructions.  Will watch your condition.  Will get help right away if you are not doing well or get worse.   This information is not intended to replace advice given to you by your health care provider. Make sure you discuss any questions you have with your health care provider.   Document Released: 12/09/2005 Document Revised: 04/25/2015 Document Reviewed: 10/01/2013 Elsevier Interactive Patient Education 2016 [employer].     IF you received an x-ray today, you will receive an invoice from [facility]. Please contact [facility] at [phone] with questions or concerns regarding your invoice.   IF you received labwork today, you will receive an invoice from [employer]. Please contact [facility] at [phone] with questions or concerns regarding your invoice.   Our billing staff will not be able to assist you with questions regarding bills from these companies.  You will be contacted with the lab results as soon as they are available. The fastest way to get your results is to activate your My Chart account. Instructions are located on the last page of this paperwork. If you have not heard from [place] regarding the results in 2 weeks, please contact this office.    We recommend that you schedule a mammogram for breast cancer screening. Typically, you do not need a  referral to do this. Please contact a local imaging center to schedule your mammogram.  [facility] - [phone]  *ask for the Radiology Department [facility] - [phone] or [phone]  [facility] - [phone] [place] [phone] [facility] - [phone]  *ask for the [facility] - [phone]  *ask for the Radiology Department [facility] - [phone]  *ask for the [place] - [phone]

## 2016-07-19 NOTE — Progress Notes (Signed)
    MRN: [id number] DOB: 09-01-1956  Subjective:   Vanessa Williamson is a 60 y.o. female presenting for follow up on HTN.   Managed well with HCTZ. Admits having had lower leg swelling, has sedentary job. Denies dizziness, chronic headache, blurred vision, chest pain, shortness of breath, heart racing, palpitations, nausea, vomiting, abdominal pain, hematuria. Smokes 1.5 ppd, is trying to cut back. She failed Wellbutrin therapy, is not interested in Chantix. Denies drinking alcohol.   Vanessa Williamson has a current medication list which includes the following prescription(s): hydrochlorothiazide. Also has No Known Allergies.  Vanessa Williamson  has a past medical history of GERD (gastroesophageal reflux disease) and Hypertension. Also  has a past surgical history that includes Humerus fracture surgery and Fracture surgery.  Objective:   Vitals: BP 124/70   Pulse 85   Temp 98 F (36.7 C) (Oral)   Resp 18   Ht 5\' 4"  (1.626 m)   Wt 230 lb (104.3 kg)   SpO2 97%   BMI 39.48 kg/m   BP Readings from Last 3 Encounters:  07/19/16 124/70  04/06/16 130/70  10/21/15 110/60   Physical Exam  Constitutional: She is oriented to person, place, and time. She appears well-developed and well-nourished.  Cardiovascular: Normal rate, regular rhythm and intact distal pulses.  Exam reveals no gallop and no friction rub.   No murmur heard. Pulmonary/Chest: No respiratory distress. She has no wheezes. She has no rales.  Musculoskeletal: She exhibits edema (trace up to mid calves).  Neurological: She is alert and oriented to person, place, and time.   Assessment and Plan :   1. Essential hypertension - Well controlled, continue HCTZ. Labs pending, follow up in 6 months.  Jaynee Eagles, PA-C [facility] [phone] 07/19/2016 12:32 PM

## 2016-07-22 ENCOUNTER — Encounter: Payer: Self-pay | Admitting: Urgent Care

## 2016-07-22 LAB — MICROALBUMIN, URINE: Microalb, Ur: 0.4 mg/dL

## 2017-06-05 ENCOUNTER — Other Ambulatory Visit: Payer: Self-pay | Admitting: Urgent Care

## 2017-06-05 DIAGNOSIS — I10 Essential (primary) hypertension: Secondary | ICD-10-CM

## 2017-06-06 NOTE — Telephone Encounter (Signed)
Please schedule patient for f/u on HTN. OV needed for additional refills.

## 2017-06-07 ENCOUNTER — Other Ambulatory Visit: Payer: Self-pay | Admitting: Urgent Care

## 2017-06-07 DIAGNOSIS — I10 Essential (primary) hypertension: Secondary | ICD-10-CM

## 2017-06-10 NOTE — Telephone Encounter (Signed)
Please schedule for f/u on HTN, patient is due.

## 2017-07-10 ENCOUNTER — Ambulatory Visit (INDEPENDENT_AMBULATORY_CARE_PROVIDER_SITE_OTHER): Payer: 59 | Admitting: Urgent Care

## 2017-07-10 ENCOUNTER — Encounter: Payer: Self-pay | Admitting: Urgent Care

## 2017-07-10 VITALS — BP 122/78 | HR 93 | Temp 98.1°F | Resp 16 | Ht 64.0 in | Wt 207.0 lb

## 2017-07-10 DIAGNOSIS — F17200 Nicotine dependence, unspecified, uncomplicated: Secondary | ICD-10-CM

## 2017-07-10 DIAGNOSIS — I10 Essential (primary) hypertension: Secondary | ICD-10-CM

## 2017-07-10 DIAGNOSIS — K469 Unspecified abdominal hernia without obstruction or gangrene: Secondary | ICD-10-CM | POA: Diagnosis not present

## 2017-07-10 MED ORDER — HYDROCHLOROTHIAZIDE 25 MG PO TABS
25.0000 mg | ORAL_TABLET | Freq: Every day | ORAL | 3 refills | Status: DC
Start: 2017-07-10 — End: 2018-07-20

## 2017-07-10 MED ORDER — NICOTINE 21 MG/24HR TD PT24: 21.0000 mg | MEDICATED_PATCH | Freq: Every day | TRANSDERMAL | 0 refills | Status: DC

## 2017-07-10 MED ORDER — NICOTINE 14 MG/24HR TD PT24: 14.0000 mg | MEDICATED_PATCH | Freq: Every day | TRANSDERMAL | 0 refills | Status: DC

## 2017-07-10 NOTE — Progress Notes (Signed)
    MRN: [id number] DOB: 25-Sep-1956  Subjective:   Vanessa Williamson is a 61 y.o. female presenting for follow up on Hypertension.   Currently managed with HCTZ. Patient is not checking blood pressure at home. Avoids salt in diet, is not exercising. Reports having had belly pain only with eating cucumbers. She has since stopped eating cucumbers and no longer has belly pain. Denies dizziness, chronic headache, blurred vision, chest pain, shortness of breath, heart racing, palpitations, nausea, vomiting, hematuria, lower leg swelling. Smokes 1.5ppd, is ready to quit. She would like to try patches. Drinks alcohol occasionally.   Vanessa Williamson has a current medication list which includes the following prescription(s): hydrochlorothiazide. Also has No Known Allergies. Vanessa Williamson  has a past medical history of GERD (gastroesophageal reflux disease) and Hypertension. Also  has a past surgical history that includes Humerus fracture surgery and Fracture surgery.  Objective:   Vitals: BP 122/78 (BP Location: Right Arm, Patient Position: Sitting, Cuff Size: Large)   Pulse 93   Temp 98.1 F (36.7 C) (Oral)   Resp 16   Ht 5\' 4"  (1.626 m)   Wt 207 lb (93.9 kg)   SpO2 96%   BMI 35.53 kg/m   BP Readings from Last 3 Encounters:  07/19/16 124/70  04/06/16 130/70  10/21/15 110/60   Physical Exam  Constitutional: She is oriented to person, place, and time. She appears well-developed and well-nourished.  HENT:  Mouth/Throat: Oropharynx is clear and moist.  Eyes: No scleral icterus.  Neck: Normal range of motion. Neck supple. No thyromegaly present.  Cardiovascular: Normal rate, regular rhythm and intact distal pulses.  Exam reveals no gallop and no friction rub.   No murmur heard. Pulmonary/Chest: No respiratory distress. She has no wheezes. She has no rales.  Abdominal: Soft. Bowel sounds are normal. She exhibits no distension and no mass. There is no tenderness. There is no guarding. A hernia (prominent abdominal  hernia ~20cm in diameter, non-tender, not new) is present.  Musculoskeletal: She exhibits no edema.  Lymphadenopathy:    She has no cervical adenopathy.  Neurological: She is alert and oriented to person, place, and time.  Skin: Skin is warm and dry.  Psychiatric: She has a normal mood and affect.   Assessment and Plan :   1. Essential hypertension - Well controlled. Refilled HCTZ. Labs pending. Follow up in 1 year. - Comprehensive metabolic panel - Microalbumin / creatinine urine ratio - hydrochlorothiazide (HYDRODIURIL) 25 MG tablet; Take 1 tablet (25 mg total) by mouth daily.  Dispense: 90 tablet; Refill: 3  2. Tobacco use disorder - Counseled on smoking cessation. Will have patient start patches and recheck in 6 weeks. - nicotine (NICODERM CQ) 21 mg/24hr patch; Place 1 patch (21 mg total) onto the skin daily.  Dispense: 42 patch; Refill: 0 - nicotine (NICODERM CQ) 14 mg/24hr patch; Place 1 patch (14 mg total) onto the skin daily.  Dispense: 28 patch; Refill: 0  3. Abdominal hernia without obstruction and without gangrene, recurrence not specified, unspecified hernia type - Referred to general surgery for consult  Jaynee Eagles, PA-C [facility] [phone] 07/10/2017  8:38 AM

## 2017-07-10 NOTE — Patient Instructions (Addendum)
Steps to Quit Smoking Smoking tobacco can be harmful to your health and can affect almost every organ in your body. Smoking puts you, and those around you, at risk for developing many serious chronic diseases. Quitting smoking is difficult, but it is one of the best things that you can do for your health. It is never too late to quit. What are the benefits of quitting smoking? When you quit smoking, you lower your risk of developing serious diseases and conditions, such as:  Lung cancer or lung disease, such as COPD.  Heart disease.  Stroke.  Heart attack.  Infertility.  Osteoporosis and bone fractures.  Additionally, symptoms such as coughing, wheezing, and shortness of breath may get better when you quit. You may also find that you get sick less often because your body is stronger at fighting off colds and infections. If you are pregnant, quitting smoking can help to reduce your chances of having a baby of low birth weight. How do I get ready to quit? When you decide to quit smoking, create a plan to make sure that you are successful. Before you quit:  Pick a date to quit. Set a date within the next two weeks to give you time to prepare.  Write down the reasons why you are quitting. Keep this list in places where you will see it often, such as on your bathroom mirror or in your car or wallet.  Identify the people, places, things, and activities that make you want to smoke (triggers) and avoid them. Make sure to take these actions: ? Throw away all cigarettes at home, at work, and in your car. ? Throw away smoking accessories, such as ashtrays and lighters. ? Clean your car and make sure to empty the ashtray. ? Clean your home, including curtains and carpets.  Tell your family, friends, and coworkers that you are quitting. Support from your loved ones can make quitting easier.  Talk with your health care provider about your options for quitting smoking.  Find out what treatment  options are covered by your health insurance.  What strategies can I use to quit smoking? Talk with your healthcare provider about different strategies to quit smoking. Some strategies include:  Quitting smoking altogether instead of gradually lessening how much you smoke over a period of time. Research shows that quitting "cold turkey" is more successful than gradually quitting.  Attending in-person counseling to help you build problem-solving skills. You are more likely to have success in quitting if you attend several counseling sessions. Even short sessions of 10 minutes can be effective.  Finding resources and support systems that can help you to quit smoking and remain smoke-free after you quit. These resources are most helpful when you use them often. They can include: ? Online chats with a counselor. ? Telephone quitlines. ? Printed self-help materials. ? Support groups or group counseling. ? Text messaging programs. ? Mobile phone applications.  Taking medicines to help you quit smoking. (If you are pregnant or breastfeeding, talk with your health care provider first.) Some medicines contain nicotine and some do not. Both types of medicines help with cravings, but the medicines that include nicotine help to relieve withdrawal symptoms. Your health care provider may recommend: ? Nicotine patches, gum, or lozenges. ? Nicotine inhalers or sprays. ? Non-nicotine medicine that is taken by mouth.  Talk with your health care provider about combining strategies, such as taking medicines while you are also receiving in-person counseling. Using these two strategies together   makes you more likely to succeed in quitting than if you used either strategy on its own. If you are pregnant or breastfeeding, talk with your health care provider about finding counseling or other support strategies to quit smoking. Do not take medicine to help you quit smoking unless told to do so by your health care  provider. What things can I do to make it easier to quit? Quitting smoking might feel overwhelming at first, but there is a lot that you can do to make it easier. Take these important actions:  Reach out to your family and friends and ask that they support and encourage you during this time. Call telephone quitlines, reach out to support groups, or work with a counselor for support.  Ask people who smoke to avoid smoking around you.  Avoid places that trigger you to smoke, such as bars, parties, or smoke-break areas at work.  Spend time around people who do not smoke.  Lessen stress in your life, because stress can be a smoking trigger for some people. To lessen stress, try: ? Exercising regularly. ? Deep-breathing exercises. ? Yoga. ? Meditating. ? Performing a body scan. This involves closing your eyes, scanning your body from head to toe, and noticing which parts of your body are particularly tense. Purposefully relax the muscles in those areas.  Download or purchase mobile phone or tablet apps (applications) that can help you stick to your quit plan by providing reminders, tips, and encouragement. There are many free apps, such as QuitGuide from the [employer] Office manager for Disease Control and Prevention). You can find other support for quitting smoking (smoking cessation) through smokefree.gov and other websites.  How will I feel when I quit smoking? Within the first 24 hours of quitting smoking, you may start to feel some withdrawal symptoms. These symptoms are usually most noticeable 2-3 days after quitting, but they usually do not last beyond 2-3 weeks. Changes or symptoms that you might experience include:  Mood swings.  Restlessness, anxiety, or irritation.  Difficulty concentrating.  Dizziness.  Strong cravings for sugary foods in addition to nicotine.  Mild weight gain.  Constipation.  Nausea.  Coughing or a sore throat.  Changes in how your medicines work in your  body.  A depressed mood.  Difficulty sleeping (insomnia).  After the first 2-3 weeks of quitting, you may start to notice more positive results, such as:  Improved sense of smell and taste.  Decreased coughing and sore throat.  Slower heart rate.  Lower blood pressure.  Clearer skin.  The ability to breathe more easily.  Fewer sick days.  Quitting smoking is very challenging for most people. Do not get discouraged if you are not successful the first time. Some people need to make many attempts to quit before they achieve long-term success. Do your best to stick to your quit plan, and talk with your health care provider if you have any questions or concerns. This information is not intended to replace advice given to you by your health care provider. Make sure you discuss any questions you have with your health care provider. Document Released: 12/03/2001 Document Revised: 08/06/2016 Document Reviewed: 04/25/2015 Elsevier Interactive Patient Education  2017 [employer].     IF you received an x-ray today, you will receive an invoice from [facility]. Please contact [facility] at [phone] with questions or concerns regarding your invoice.   IF you received labwork today, you will receive an invoice from [facility]. Please contact [facility] at [phone]  with questions or concerns regarding your invoice.   Our billing staff will not be able to assist you with questions regarding bills from these companies.  You will be contacted with the lab results as soon as they are available. The fastest way to get your results is to activate your My Chart account. Instructions are located on the last page of this paperwork. If you have not heard from [place] regarding the results in 2 weeks, please contact this office.

## 2017-07-11 LAB — COMPREHENSIVE METABOLIC PANEL
ALK PHOS: 45 IU/L (ref 39–117)
ALT: 6 IU/L (ref 0–32)
AST: 10 IU/L (ref 0–40)
Albumin/Globulin Ratio: 1.6 (ref 1.2–2.2)
Albumin: 3.9 g/dL (ref 3.6–4.8)
BILIRUBIN TOTAL: 0.3 mg/dL (ref 0.0–1.2)
BUN/Creatinine Ratio: 20 (ref 12–28)
BUN: 25 mg/dL (ref 8–27)
CHLORIDE: 104 mmol/L (ref 96–106)
CO2: 23 mmol/L (ref 20–29)
CREATININE: 1.27 mg/dL — AB (ref 0.57–1.00)
Calcium: 9 mg/dL (ref 8.7–10.3)
GFR calc Af Amer: 53 mL/min/{1.73_m2} — ABNORMAL LOW (ref >59)
GFR calc non Af Amer: 46 mL/min/{1.73_m2} — ABNORMAL LOW (ref >59)
Globulin, Total: 2.4 g/dL (ref 1.5–4.5)
Glucose: 90 mg/dL (ref 65–99)
Potassium: 4.4 mmol/L (ref 3.5–5.2)
Sodium: 141 mmol/L (ref 134–144)
Total Protein: 6.3 g/dL (ref 6.0–8.5)

## 2017-07-12 LAB — MICROALBUMIN / CREATININE URINE RATIO
Creatinine, Urine: 115 mg/dL
Microalb/Creat Ratio: 23.3 mg/g creat (ref 0.0–30.0)
Microalbumin, Urine: 26.8 ug/mL

## 2017-07-14 ENCOUNTER — Telehealth: Payer: Self-pay

## 2017-07-14 ENCOUNTER — Encounter: Payer: Self-pay | Admitting: Urgent Care

## 2017-07-14 NOTE — Telephone Encounter (Signed)
PA form for Nicoderm patch placed in box. Please see highlighted items and sign. Thank you.

## 2017-07-17 NOTE — Telephone Encounter (Signed)
Forms completed and placed in fax bin.

## 2017-07-21 ENCOUNTER — Other Ambulatory Visit: Payer: Self-pay | Admitting: Urgent Care

## 2017-07-21 DIAGNOSIS — I10 Essential (primary) hypertension: Secondary | ICD-10-CM

## 2017-07-28 NOTE — Telephone Encounter (Signed)
RX approved until 07/21/2018. Patient notified.

## 2017-08-21 ENCOUNTER — Ambulatory Visit: Payer: 59 | Admitting: Urgent Care

## 2017-08-28 ENCOUNTER — Ambulatory Visit (INDEPENDENT_AMBULATORY_CARE_PROVIDER_SITE_OTHER): Payer: 59 | Admitting: Urgent Care

## 2017-08-28 ENCOUNTER — Encounter: Payer: Self-pay | Admitting: Urgent Care

## 2017-08-28 VITALS — BP 144/86 | HR 86 | Temp 98.0°F | Resp 16 | Ht 63.0 in | Wt 205.8 lb

## 2017-08-28 DIAGNOSIS — Z23 Encounter for immunization: Secondary | ICD-10-CM | POA: Diagnosis not present

## 2017-08-28 DIAGNOSIS — S31109A Unspecified open wound of abdominal wall, unspecified quadrant without penetration into peritoneal cavity, initial encounter: Secondary | ICD-10-CM

## 2017-08-28 DIAGNOSIS — F17200 Nicotine dependence, unspecified, uncomplicated: Secondary | ICD-10-CM

## 2017-08-28 MED ORDER — NICOTINE 21 MG/24HR TD PT24: 21.0000 mg | MEDICATED_PATCH | Freq: Every day | TRANSDERMAL | 0 refills | Status: DC

## 2017-08-28 MED ORDER — CEPHALEXIN 500 MG PO CAPS: 500.0000 mg | ORAL_CAPSULE | Freq: Four times a day (QID) | ORAL | 0 refills | Status: DC
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
## 2017-08-28 NOTE — Patient Instructions (Addendum)
We are going to refer you to a wound care clinic. In the meantime, start Keflex four times daily with food. Apply dressings daily until you are seen by a provider at the wound care clinic where you will be provided with new wound care instructions. If your wound culture grows bacteria that have resistance to the antibiotic we prescribed, we will call you and let you know about the new antibiotic we will use.     Wound Care, Adult Taking care of your wound properly can help to prevent pain and infection. It can also help your wound to heal more quickly. How is this treated? Wound care  Follow instructions from your health care provider about how to take care of your wound. Make sure you: ? Wash your hands with soap and water before you change the bandage (dressing). If soap and water are not available, use hand sanitizer. ? Change your dressing as told by your health care provider. ? Leave stitches (sutures), skin glue, or adhesive strips in place. These skin closures may need to stay in place for 2 weeks or longer. If adhesive strip edges start to loosen and curl up, you may trim the loose edges. Do not remove adhesive strips completely unless your health care provider tells you to do that.  Check your wound area every day for signs of infection. Check for: ? More redness, swelling, or pain. ? More fluid or blood. ? Warmth. ? Pus or a bad smell.  Ask your health care provider if you should clean the wound with mild soap and water. Doing this may include: ? Using a clean towel to pat the wound dry after cleaning it. Do not rub or scrub the wound. ? Applying a cream or ointment. Do this only as told by your health care provider. ? Covering the incision with a clean dressing.  Ask your health care provider when you can leave the wound uncovered. Medicines   If you were prescribed an antibiotic medicine, cream, or ointment, take or use the antibiotic as told by your health care provider. Do  not stop taking or using the antibiotic even if your condition improves.  Take over-the-counter and prescription medicines only as told by your health care provider. If you were prescribed pain medicine, take it at least 30 minutes before doing any wound care or as told by your health care provider. General instructions  Return to your normal activities as told by your health care provider. Ask your health care provider what activities are safe.  Do not scratch or pick at the wound.  Keep all follow-up visits as told by your health care provider. This is important.  Eat a diet that includes protein, vitamin A, vitamin C, and other nutrient-rich foods. These help the wound heal: ? Protein-rich foods include meat, dairy, beans, nuts, and other sources. ? Vitamin A-rich foods include carrots and dark green, leafy vegetables. ? Vitamin C-rich foods include citrus, tomatoes, and other fruits and vegetables. ? Nutrient-rich foods have protein, carbohydrates, fat, vitamins, or minerals. Eat a variety of healthy foods including vegetables, fruits, and whole grains. Contact a health care provider if:  You received a tetanus shot and you have swelling, severe pain, redness, or bleeding at the injection site.  Your pain is not controlled with medicine.  You have more redness, swelling, or pain around the wound.  You have more fluid or blood coming from the wound.  Your wound feels warm to the touch.  You have  pus or a bad smell coming from the wound.  You have a fever or chills.  You are nauseous or you vomit.  You are dizzy. Get help right away if:  You have a red streak going away from your wound.  The edges of the wound open up and separate.  Your wound is bleeding and the bleeding does not stop with gentle pressure.  You have a rash.  You faint.  You have trouble breathing. This information is not intended to replace advice given to you by your health care provider. Make sure  you discuss any questions you have with your health care provider. Document Released: 09/17/2008 Document Revised: 08/07/2016 Document Reviewed: 06/25/2016 Elsevier Interactive Patient Education  2017 [employer].     IF you received an x-ray today, you will receive an invoice from [facility]. Please contact [facility] at [phone] with questions or concerns regarding your invoice.   IF you received labwork today, you will receive an invoice from [facility]. Please contact [facility] at [phone] with questions or concerns regarding your invoice.   Our billing staff will not be able to assist you with questions regarding bills from these companies.  You will be contacted with the lab results as soon as they are available. The fastest way to get your results is to activate your My Chart account. Instructions are located on the last page of this paperwork. If you have not heard from [place] regarding the results in 2 weeks, please contact this office.    Influenza (Flu) Vaccine (Inactivated or Recombinant): What You Need to Know 1. Why get vaccinated? Influenza ("flu") is a contagious disease that spreads around the [place] every year, usually between October and May. Flu is caused by influenza viruses, and is spread mainly by coughing, sneezing, and close contact. Anyone can get flu. Flu strikes suddenly and can last several days. Symptoms vary by age, but can include:  fever/chills  sore throat  muscle aches  fatigue  cough  headache  runny or stuffy nose  Flu can also lead to pneumonia and blood infections, and cause diarrhea and seizures in children. If you have a medical condition, such as heart or lung disease, flu can make it worse. Flu is more dangerous for some people. Infants and young children, people 33 years of age and older, pregnant women, and people with certain health conditions or a weakened immune system are at greatest risk. Each year  thousands of people in the [place] die from flu, and many more are hospitalized. Flu vaccine can:  keep you from getting flu,  make flu less severe if you do get it, and  keep you from spreading flu to your family and other people. 2. Inactivated and recombinant flu vaccines A dose of flu vaccine is recommended every flu season. Children 6 months through 67 years of age may need two doses during the same flu season. Everyone else needs only one dose each flu season. Some inactivated flu vaccines contain a very small amount of a mercury-based preservative called thimerosal. Studies have not shown thimerosal in vaccines to be harmful, but flu vaccines that do not contain thimerosal are available. There is no live flu virus in flu shots. They cannot cause the flu. There are many flu viruses, and they are always changing. Each year a new flu vaccine is made to protect against three or four viruses that are likely to cause disease in the upcoming flu season. But even when the  vaccine doesn't exactly match these viruses, it may still provide some protection. Flu vaccine cannot prevent:  flu that is caused by a virus not covered by the vaccine, or  illnesses that look like flu but are not.  It takes about 2 weeks for protection to develop after vaccination, and protection lasts through the flu season. 3. Some people should not get this vaccine Tell the person who is giving you the vaccine:  If you have any severe, life-threatening allergies. If you ever had a life-threatening allergic reaction after a dose of flu vaccine, or have a severe allergy to any part of this vaccine, you may be advised not to get vaccinated. Most, but not all, types of flu vaccine contain a small amount of egg protein.  If you ever had Guillain-Barr Syndrome (also called GBS). Some people with a history of GBS should not get this vaccine. This should be discussed with your doctor.  If you are not feeling well. It is  usually okay to get flu vaccine when you have a mild illness, but you might be asked to come back when you feel better.  4. Risks of a vaccine reaction With any medicine, including vaccines, there is a chance of reactions. These are usually mild and go away on their own, but serious reactions are also possible. Most people who get a flu shot do not have any problems with it. Minor problems following a flu shot include:  soreness, redness, or swelling where the shot was given  hoarseness  sore, red or itchy eyes  cough  fever  aches  headache  itching  fatigue  If these problems occur, they usually begin soon after the shot and last 1 or 2 days. More serious problems following a flu shot can include the following:  There may be a small increased risk of Guillain-Barre Syndrome (GBS) after inactivated flu vaccine. This risk has been estimated at 1 or 2 additional cases per million people vaccinated. This is much lower than the risk of severe complications from flu, which can be prevented by flu vaccine.  Young children who get the flu shot along with pneumococcal vaccine (PCV13) and/or DTaP vaccine at the same time might be slightly more likely to have a seizure caused by fever. Ask your doctor for more information. Tell your doctor if a child who is getting flu vaccine has ever had a seizure.  Problems that could happen after any injected vaccine:  People sometimes faint after a medical procedure, including vaccination. Sitting or lying down for about 15 minutes can help prevent fainting, and injuries caused by a fall. Tell your doctor if you feel dizzy, or have vision changes or ringing in the ears.  Some people get severe pain in the shoulder and have difficulty moving the arm where a shot was given. This happens very rarely.  Any medication can cause a severe allergic reaction. Such reactions from a vaccine are very rare, estimated at about 1 in a million doses, and would  happen within a few minutes to a few hours after the vaccination. As with any medicine, there is a very remote chance of a vaccine causing a serious injury or death. The safety of vaccines is always being monitored. For more information, visit: http://www.aguilar.org/ 5. What if there is a serious reaction? What should I look for? Look for anything that concerns you, such as signs of a severe allergic reaction, very high fever, or unusual behavior. Signs of a severe allergic reaction  can include hives, swelling of the face and throat, difficulty breathing, a fast heartbeat, dizziness, and weakness. These would start a few minutes to a few hours after the vaccination. What should I do?  If you think it is a severe allergic reaction or other emergency that can't wait, call 9-1-1 and get the person to the nearest hospital. Otherwise, call your doctor.  Reactions should be reported to the Vaccine Adverse Event Reporting System (VAERS). Your doctor should file this report, or you can do it yourself through the VAERS web site at www.vaers.SamedayNews.es, or by calling 909-420-1389. ? VAERS does not give medical advice. 6. The National Vaccine Injury Compensation Program The [employer] Vaccine Injury Compensation Program (VICP) is a federal program that was created to compensate people who may have been injured by certain vaccines. Persons who believe they may have been injured by a vaccine can learn about the program and about filing a claim by calling 939 129 7650 or visiting the [place] website at GoldCloset.com.ee. There is a time limit to file a claim for compensation. 7. How can I learn more?  Ask your healthcare provider. He or she can give you the vaccine package insert or suggest other sources of information.  Call your local or state health department.  Contact the Centers for Disease Control and Prevention (CDC): ? Call (617) 155-0226 (1-800-CDC-INFO) or ? Visit CDC's website at  https://gibson.com/ Vaccine Information Statement, Inactivated Influenza Vaccine (07/29/2014) This information is not intended to replace advice given to you by your health care provider. Make sure you discuss any questions you have with your health care provider. Document Released: 10/03/2006 Document Revised: 08/29/2016 Document Reviewed: 08/29/2016 Elsevier Interactive Patient Education  2017 [employer].

## 2017-09-01 LAB — WOUND CULTURE

## 2017-09-10 DIAGNOSIS — K439 Ventral hernia without obstruction or gangrene: Secondary | ICD-10-CM | POA: Diagnosis not present

## 2017-09-12 ENCOUNTER — Other Ambulatory Visit: Payer: Self-pay | Admitting: Surgery

## 2017-09-12 DIAGNOSIS — K439 Ventral hernia without obstruction or gangrene: Secondary | ICD-10-CM

## 2017-09-17 ENCOUNTER — Ambulatory Visit
Admission: RE | Admit: 2017-09-17 | Discharge: 2017-09-17 | Disposition: A | Discharge status: Home / Self Care | Payer: 59 | Source: Ambulatory Visit | Attending: Surgery | Admitting: Surgery

## 2017-09-17 DIAGNOSIS — K439 Ventral hernia without obstruction or gangrene: Secondary | ICD-10-CM

## 2017-09-17 DIAGNOSIS — K5730 Diverticulosis of large intestine without perforation or abscess without bleeding: Secondary | ICD-10-CM | POA: Diagnosis not present

## 2017-09-19 ENCOUNTER — Other Ambulatory Visit: Payer: Self-pay | Admitting: Surgery

## 2017-09-25 DIAGNOSIS — K439 Ventral hernia without obstruction or gangrene: Secondary | ICD-10-CM | POA: Diagnosis not present

## 2017-09-25 DIAGNOSIS — T300 Burn of unspecified body region, unspecified degree: Secondary | ICD-10-CM | POA: Diagnosis not present

## 2017-10-09 DIAGNOSIS — K439 Ventral hernia without obstruction or gangrene: Secondary | ICD-10-CM | POA: Diagnosis not present

## 2017-10-09 DIAGNOSIS — T8189XD Other complications of procedures, not elsewhere classified, subsequent encounter: Secondary | ICD-10-CM | POA: Diagnosis not present

## 2017-10-09 DIAGNOSIS — T300 Burn of unspecified body region, unspecified degree: Secondary | ICD-10-CM | POA: Diagnosis not present

## 2017-10-13 NOTE — Pre-Procedure Instructions (Signed)
Vanessa Williamson  10/13/2017      [id number] [address], [place] [address] Phone: [phone] Fax: [phone]  [place], [place] [address] [place] [address] Phone: [phone] Fax: [phone]    Your procedure is scheduled on Thursday October 25.  Report to [facility] Admitting at 11:30 A.M.  Call this number if you have problems the morning of surgery:  [phone]   Remember:  Do not eat food or drink liquids after midnight.  Take these medicines the morning of surgery with A SIP OF WATER: loratadine (claritin)  7 days prior to surgery STOP taking any Aspirin (unless otherwise instructed by your surgeon), Aleve, Naproxen, Ibuprofen, Motrin, Advil, Goody's, BC's, all herbal medications, fish oil, and all vitamins    Do not wear jewelry, make-up or nail polish.  Do not wear lotions, powders, or perfumes, or deoderant.  Do not shave 48 hours prior to surgery.  Men may shave face and neck.  Do not bring valuables to the hospital.  [facility] is not responsible for any belongings or valuables.  Contacts, dentures or bridgework may not be worn into surgery.  Leave your suitcase in the car.  After surgery it may be brought to your room.  For patients admitted to the hospital, discharge time will be determined by your treatment team.  Patients discharged the day of surgery will not be allowed to drive home.    Special instructions:    [facility]- Preparing For Surgery  Before surgery, you can play an important role. Because skin is not sterile, your skin needs to be as free of germs as possible. You can reduce the number of germs on your skin by washing with CHG (chlorahexidine gluconate) Soap before surgery.  CHG is an antiseptic cleaner which kills germs and bonds with the skin to continue killing germs even after  washing.  Please do not use if you have an allergy to CHG or antibacterial soaps. If your skin becomes reddened/irritated stop using the CHG.  Do not shave (including legs and underarms) for at least 48 hours prior to first CHG shower. It is OK to shave your face.  Please follow these instructions carefully.   1. Shower the NIGHT BEFORE SURGERY and the MORNING OF SURGERY with CHG.   2. If you chose to wash your hair, wash your hair first as usual with your normal shampoo.  3. After you shampoo, rinse your hair and body thoroughly to remove the shampoo.  4. Use CHG as you would any other liquid soap. You can apply CHG directly to the skin and wash gently with a scrungie or a clean washcloth.   5. Apply the CHG Soap to your body ONLY FROM THE NECK DOWN.  Do not use on open wounds or open sores. Avoid contact with your eyes, ears, mouth and genitals (private parts). Wash Face and genitals (private parts)  with your normal soap.  6. Wash thoroughly, paying special attention to the area where your surgery will be performed.  7. Thoroughly rinse your body with warm water from the neck down.  8. DO NOT shower/wash with your normal soap after using and rinsing off the CHG Soap.  9. Pat yourself dry with a CLEAN TOWEL.  10. Wear CLEAN PAJAMAS to bed the night before surgery, wear comfortable clothes the morning of surgery  11. Place CLEAN SHEETS on your bed the night of your first shower and DO NOT SLEEP WITH PETS.    Day of Surgery: Do not apply any deodorants/lotions. Please wear clean clothes to the hospital/surgery center.      Please read over the following fact sheets that you were given. Coughing and Deep Breathing, Total Joint Packet and MRSA Information

## 2017-10-14 ENCOUNTER — Encounter (HOSPITAL_COMMUNITY): Payer: Self-pay

## 2017-10-14 ENCOUNTER — Encounter (HOSPITAL_COMMUNITY)
Admission: RE | Admit: 2017-10-14 | Discharge: 2017-10-14 | Disposition: A | Discharge status: Home / Self Care | Payer: 59 | Source: Ambulatory Visit | Attending: Surgery | Admitting: Surgery

## 2017-10-14 DIAGNOSIS — F17210 Nicotine dependence, cigarettes, uncomplicated: Secondary | ICD-10-CM | POA: Diagnosis not present

## 2017-10-14 DIAGNOSIS — K436 Other and unspecified ventral hernia with obstruction, without gangrene: Secondary | ICD-10-CM | POA: Diagnosis present

## 2017-10-14 DIAGNOSIS — Z6835 Body mass index (BMI) 35.0-35.9, adult: Secondary | ICD-10-CM | POA: Diagnosis not present

## 2017-10-14 DIAGNOSIS — Z23 Encounter for immunization: Secondary | ICD-10-CM | POA: Diagnosis not present

## 2017-10-14 DIAGNOSIS — E669 Obesity, unspecified: Secondary | ICD-10-CM | POA: Diagnosis not present

## 2017-10-14 DIAGNOSIS — I10 Essential (primary) hypertension: Secondary | ICD-10-CM | POA: Diagnosis not present

## 2017-10-14 DIAGNOSIS — Z96612 Presence of left artificial shoulder joint: Secondary | ICD-10-CM | POA: Diagnosis not present

## 2017-10-14 DIAGNOSIS — K219 Gastro-esophageal reflux disease without esophagitis: Secondary | ICD-10-CM | POA: Diagnosis not present

## 2017-10-14 HISTORY — DX: Malignant (primary) neoplasm, unspecified: C80.1

## 2017-10-14 LAB — CBC
HCT: 34.9 % — ABNORMAL LOW (ref 36.0–46.0)
Hemoglobin: 11.3 g/dL — ABNORMAL LOW (ref 12.0–15.0)
MCH: 31.5 pg (ref 26.0–34.0)
MCHC: 32.4 g/dL (ref 30.0–36.0)
MCV: 97.2 fL (ref 78.0–100.0)
Platelets: 181 10*3/uL (ref 150–400)
RBC: 3.59 MIL/uL — AB (ref 3.87–5.11)
RDW: 13.2 % (ref 11.5–15.5)
WBC: 4.1 10*3/uL (ref 4.0–10.5)

## 2017-10-14 LAB — BASIC METABOLIC PANEL
Anion gap: 6 (ref 5–15)
BUN: 16 mg/dL (ref 6–20)
CO2: 25 mmol/L (ref 22–32)
CREATININE: 1.04 mg/dL — AB (ref 0.44–1.00)
Calcium: 9.1 mg/dL (ref 8.9–10.3)
Chloride: 108 mmol/L (ref 101–111)
GFR calc Af Amer: >60 mL/min (ref >60)
GFR calc non Af Amer: 56 mL/min — ABNORMAL LOW (ref >60)
GLUCOSE: 105 mg/dL — AB (ref 65–99)
POTASSIUM: 4.3 mmol/L (ref 3.5–5.1)
SODIUM: 139 mmol/L (ref 135–145)

## 2017-10-14 NOTE — Progress Notes (Signed)
Burn to abdomen  (hernia area)

## 2017-10-15 NOTE — H&P (Signed)
Vanessa Williamson  Location: [facility] Patient #: [id number] DOB: 17-Aug-1956 Single / Language: Vanessa Williamson / Race: Black or African American Female  History of Present Illness   The patient is a 61 year old female who presents with a complaint of abdominal wall hernia.  The PCP is Dr. Delman Cheadle  The patient was referred by Jaynee Eagles. She comes by herself.  The patient saw Dr. Greer Pickerel on 03 February 2014. He noted at that time she had had a hernia since 2006. She had a history of smoking a pack and a half of cigarettes a day. She was obese. Dr. Redmond Pulling discussed with her about quitting smoking and losing weight, which she said she would work on. It does not look like he seen her since then. She said this hernia began when the place she was working at changed carpet and she developed a sneezing allergy. The hernia has become larger. She is not having pain with a hernia. She has developed an ulcer on her skin over the hernia. She denies any prior abdominal surgery. She has no stomach, liver, colon disease. She underwent a negative colonoscopy in 2009. She is trying to quit smoking.  I discussed the indications and complications of hernia surgery with the patient. I discussed both the laparoscopic and open approach to hernia repair.. The potential risks of hernia surgery include, but are not limited to, bleeding, infection, open surgery, nerve injury, and recurrence of the hernia. I provided the patient literature about hernia surgery.  Past Medical History: 1. Smokes Trying to quit - I reinforced what Dr. Redmond Pulling said about quitting smoking before surgery. 2. Obesity - BMI 35 Talked about loosing weight before surgery - but this does not need to delay surgery 3. HTN 4. GERD 5. 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 Right knee scope - 2003  Social History: No married. Lives by self. Works at [employer] in [place]. She is a [employer] pilot)   Past Surgical History Vanessa Williamson, [place]; 09/10/2017 9:37 AM) Oral Surgery  Shoulder Surgery  Left.  Diagnostic Studies History Vanessa Williamson, [place]; 09/10/2017 9:37 AM) Colonoscopy  5-10 years ago Mammogram  >3 years ago Pap Smear  >5 years ago  Allergies Vanessa Williamson, RMA; 09/10/2017 9:37 AM) No Known Allergies 09/10/2017  Medication History Vanessa Williamson, RMA; 09/10/2017 9:38 AM) Nicotine Step 1 (21MG /24HR Patch 24HR, Transdermal) Active. HydroCHLOROthiazide (25MG  Tablet, Oral) Active. Loratadine (10MG  Tablet, Oral) Active. Medications Reconciled  Social History Vanessa Williamson, [place]; 09/10/2017 9:37 AM) Alcohol use  Occasional alcohol use. Caffeine use  Coffee. No drug use  Tobacco use  Current every day smoker.  Family History Vanessa Williamson, [place]; 09/10/2017 9:37 AM) Heart Disease  Father, Mother. Heart disease in female family member before age 34  Hypertension  Father, Mother.  Pregnancy / Birth History Vanessa Williamson, [place]; 09/10/2017 9:37 AM) Age at menarche  3 years. Age of menopause  87-50 Gravida  1 Irregular periods  Maternal age  73-25 Para  53  Other Problems Vanessa Williamson, [place]; 09/10/2017 9:37 AM) Cervical Cancer  Gastroesophageal Reflux Disease  High blood pressure  Umbilical Hernia Repair     Review of Systems Vanessa Williamson RMA; 09/10/2017 9:37 AM) General Not Present- Appetite Loss, Chills, Fatigue, Fever, Night Sweats, Weight Gain and Weight Loss. Skin Present- Non-Healing Wounds. Not Present- Change in Wart/Mole, Dryness, Hives, Jaundice, New Lesions, Rash and Ulcer. HEENT Present- Seasonal Allergies and Wears glasses/contact lenses. Not Present- Earache, Hearing  Loss, Hoarseness, Nose Bleed, Oral Ulcers, Ringing in the Ears, Sinus Pain, Sore Throat, Visual Disturbances and Yellow Eyes. Cardiovascular Present- Leg Cramps. Not Present- Chest  Pain, Difficulty Breathing Lying Down, Palpitations, Rapid Heart Rate, Shortness of Breath and Swelling of Extremities.  Vitals Vanessa Williamson RMA; 09/10/2017 9:38 AM) 09/10/2017 9:38 AM Weight: 206.6 lb Height: 64in Body Surface Area: 1.98 m Body Mass Index: 35.46 kg/m  Temp.: 98.23F  Pulse: 93 (Regular)  BP: 130/80 (Sitting, Left Arm, Standard)   Physical Exam  General: WN AA [place] and generally healthy appearing. HEENT: Normal. Pupils equal.  Neck: Supple. No mass. No thyroid mass.  Lymph Nodes: No supraclavicular or cervical nodes.  Lungs: Clear to auscultation and symmetric breath sounds. Heart: RRR. No murmur or rub.  Abdomen: Soft. No mass. No tenderness. Normal bowel sounds.   Has 16 cm bulge with 2 cm ulcer over bulge.  I had trouble feeling the edges of the hernia - so will get CT scan to evaluate this [photo in Epic]  Extremities: Good strength and ROM in upper and lower extremities.  Neurologic: Grossly intact to motor and sensory function. Psychiatric: Has normal mood and affect. Behavior is normal.    Assessment & Plan  1.  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 No evidence of bowel obstruction.  2) Quit smoking  3) Will talk about date for surgery after CT scan - she is considering November  2.  SMOKES (F17.200)  I have emphasized the importance of quitting smoking.  3. Obesity - BMI 35 Talked about loosing weight before surgery - but this does not need to delay surgery 4. HTN 5. GERD  Alphonsa Overall, MD, [facility] Pager: [phone] Office phone:  [phone]

## 2017-10-16 ENCOUNTER — Telehealth: Payer: Self-pay

## 2017-10-16 ENCOUNTER — Encounter (HOSPITAL_COMMUNITY)
Admission: RE | Disposition: A | Discharge status: Home / Self Care | Payer: Self-pay | Source: Ambulatory Visit | Attending: Surgery

## 2017-10-16 ENCOUNTER — Inpatient Hospital Stay (HOSPITAL_COMMUNITY): Payer: 59 | Admitting: Certified Registered"

## 2017-10-16 ENCOUNTER — Encounter (HOSPITAL_COMMUNITY): Payer: Self-pay | Admitting: Urology

## 2017-10-16 ENCOUNTER — Observation Stay (HOSPITAL_COMMUNITY)
Admission: RE | Admit: 2017-10-16 | Discharge: 2017-10-17 | Disposition: A | Discharge status: Home / Self Care | Payer: 59 | Source: Ambulatory Visit | Attending: Surgery | Admitting: Surgery

## 2017-10-16 DIAGNOSIS — I10 Essential (primary) hypertension: Secondary | ICD-10-CM | POA: Diagnosis not present

## 2017-10-16 DIAGNOSIS — J449 Chronic obstructive pulmonary disease, unspecified: Secondary | ICD-10-CM | POA: Diagnosis not present

## 2017-10-16 DIAGNOSIS — Z96612 Presence of left artificial shoulder joint: Secondary | ICD-10-CM | POA: Insufficient documentation

## 2017-10-16 DIAGNOSIS — K439 Ventral hernia without obstruction or gangrene: Secondary | ICD-10-CM | POA: Diagnosis present

## 2017-10-16 DIAGNOSIS — K436 Other and unspecified ventral hernia with obstruction, without gangrene: Secondary | ICD-10-CM | POA: Diagnosis not present

## 2017-10-16 DIAGNOSIS — K219 Gastro-esophageal reflux disease without esophagitis: Secondary | ICD-10-CM | POA: Insufficient documentation

## 2017-10-16 DIAGNOSIS — Z23 Encounter for immunization: Secondary | ICD-10-CM | POA: Insufficient documentation

## 2017-10-16 DIAGNOSIS — Z6835 Body mass index (BMI) 35.0-35.9, adult: Secondary | ICD-10-CM | POA: Insufficient documentation

## 2017-10-16 DIAGNOSIS — F17210 Nicotine dependence, cigarettes, uncomplicated: Secondary | ICD-10-CM | POA: Insufficient documentation

## 2017-10-16 DIAGNOSIS — E669 Obesity, unspecified: Secondary | ICD-10-CM | POA: Insufficient documentation

## 2017-10-16 HISTORY — PX: VENTRAL HERNIA REPAIR: SHX424

## 2017-10-16 SURGERY — REPAIR, HERNIA, VENTRAL, LAPAROSCOPIC
Anesthesia: General

## 2017-10-16 MED ORDER — BUPIVACAINE LIPOSOME 1.3 % IJ SUSP
INTRAMUSCULAR | Status: DC | PRN
  Administered 2017-10-16: 20 mL

## 2017-10-16 MED ORDER — ACETAMINOPHEN 500 MG PO TABS
ORAL_TABLET | ORAL | Status: AC
  Administered 2017-10-16: 1000 mg via ORAL
  Filled 2017-10-16: qty 2

## 2017-10-16 MED ORDER — ALBUMIN HUMAN 5 % IV SOLN
INTRAVENOUS | Status: DC | PRN
  Administered 2017-10-16: 16:00:00 via INTRAVENOUS

## 2017-10-16 MED ORDER — KETAMINE HCL-SODIUM CHLORIDE 100-0.9 MG/10ML-% IV SOSY
PREFILLED_SYRINGE | INTRAVENOUS | Status: AC
  Filled 2017-10-16: qty 10

## 2017-10-16 MED ORDER — HEPARIN SODIUM (PORCINE) 5000 UNIT/ML IJ SOLN
5000.0000 [IU] | Freq: Three times a day (TID) | INTRAMUSCULAR | Status: DC
  Administered 2017-10-17: 5000 [IU] via SUBCUTANEOUS
  Filled 2017-10-16: qty 1

## 2017-10-16 MED ORDER — KETOROLAC TROMETHAMINE 30 MG/ML IJ SOLN
INTRAMUSCULAR | Status: AC
  Filled 2017-10-16: qty 1

## 2017-10-16 MED ORDER — BUPIVACAINE HCL (PF) 0.25 % IJ SOLN
INTRAMUSCULAR | Status: DC | PRN
  Administered 2017-10-16: 30 mL

## 2017-10-16 MED ORDER — ONDANSETRON HCL 4 MG/2ML IJ SOLN
INTRAMUSCULAR | Status: AC
  Filled 2017-10-16: qty 2

## 2017-10-16 MED ORDER — NICOTINE 14 MG/24HR TD PT24: 14.0000 mg | MEDICATED_PATCH | Freq: Every day | TRANSDERMAL | Status: DC

## 2017-10-16 MED ORDER — DEXAMETHASONE SODIUM PHOSPHATE 10 MG/ML IJ SOLN
INTRAMUSCULAR | Status: AC
  Filled 2017-10-16: qty 1

## 2017-10-16 MED ORDER — ONDANSETRON HCL 4 MG/2ML IJ SOLN
INTRAMUSCULAR | Status: DC | PRN
  Administered 2017-10-16: 4 mg via INTRAVENOUS

## 2017-10-16 MED ORDER — KETOROLAC TROMETHAMINE 30 MG/ML IJ SOLN
INTRAMUSCULAR | Status: DC | PRN
  Administered 2017-10-16: 30 mg via INTRAVENOUS

## 2017-10-16 MED ORDER — PROMETHAZINE HCL 25 MG/ML IJ SOLN: 6.2500 mg | INTRAMUSCULAR | Status: DC | PRN

## 2017-10-16 MED ORDER — MORPHINE SULFATE (PF) 4 MG/ML IV SOLN: 1.0000 mg | INTRAVENOUS | Status: DC | PRN

## 2017-10-16 MED ORDER — PHENYLEPHRINE HCL 10 MG/ML IJ SOLN
INTRAVENOUS | Status: DC | PRN
  Administered 2017-10-16: 25 ug/min via INTRAVENOUS

## 2017-10-16 MED ORDER — LIDOCAINE 2% (20 MG/ML) 5 ML SYRINGE
INTRAMUSCULAR | Status: AC
  Filled 2017-10-16: qty 5

## 2017-10-16 MED ORDER — ROCURONIUM BROMIDE 10 MG/ML (PF) SYRINGE
PREFILLED_SYRINGE | INTRAVENOUS | Status: AC
  Filled 2017-10-16: qty 5

## 2017-10-16 MED ORDER — NICOTINE 21 MG/24HR TD PT24
21.0000 mg | MEDICATED_PATCH | Freq: Every day | TRANSDERMAL | Status: DC
  Administered 2017-10-16 – 2017-10-17 (×2): 21 mg via TRANSDERMAL
  Filled 2017-10-16 (×2): qty 1

## 2017-10-16 MED ORDER — MIDAZOLAM HCL 2 MG/2ML IJ SOLN
INTRAMUSCULAR | Status: AC
  Filled 2017-10-16: qty 2

## 2017-10-16 MED ORDER — FENTANYL CITRATE (PF) 250 MCG/5ML IJ SOLN
INTRAMUSCULAR | Status: AC
  Filled 2017-10-16: qty 5

## 2017-10-16 MED ORDER — SUGAMMADEX SODIUM 200 MG/2ML IV SOLN
INTRAVENOUS | Status: AC
  Filled 2017-10-16: qty 2

## 2017-10-16 MED ORDER — LACTATED RINGERS IV SOLN
INTRAVENOUS | Status: DC | PRN
  Administered 2017-10-16 (×2): via INTRAVENOUS

## 2017-10-16 MED ORDER — LIDOCAINE IN D5W 4-5 MG/ML-% IV SOLN
2.0000 mg/min | INTRAVENOUS | Status: DC
  Administered 2017-10-16: 2 mg/min via INTRAVENOUS
  Filled 2017-10-16: qty 500

## 2017-10-16 MED ORDER — KETAMINE HCL 100 MG/ML IJ SOLN
INTRAMUSCULAR | Status: DC | PRN
  Administered 2017-10-16 (×2): 10 mg via INTRAVENOUS
  Administered 2017-10-16: 20 mg via INTRAVENOUS

## 2017-10-16 MED ORDER — EPHEDRINE SULFATE-NACL 50-0.9 MG/10ML-% IV SOSY
PREFILLED_SYRINGE | INTRAVENOUS | Status: DC | PRN
  Administered 2017-10-16: 10 mg via INTRAVENOUS

## 2017-10-16 MED ORDER — LIDOCAINE 2% (20 MG/ML) 5 ML SYRINGE
INTRAMUSCULAR | Status: DC | PRN
  Administered 2017-10-16: 40 mg via INTRAVENOUS

## 2017-10-16 MED ORDER — HYDROCODONE-ACETAMINOPHEN 5-325 MG PO TABS: 1.0000 | ORAL_TABLET | ORAL | Status: DC | PRN

## 2017-10-16 MED ORDER — CHLORHEXIDINE GLUCONATE CLOTH 2 % EX PADS: 6.0000 | MEDICATED_PAD | Freq: Once | CUTANEOUS | Status: DC

## 2017-10-16 MED ORDER — CEFAZOLIN SODIUM-DEXTROSE 2-4 GM/100ML-% IV SOLN
INTRAVENOUS | Status: AC
  Filled 2017-10-16: qty 100

## 2017-10-16 MED ORDER — FENTANYL CITRATE (PF) 250 MCG/5ML IJ SOLN
INTRAMUSCULAR | Status: DC | PRN
  Administered 2017-10-16: 100 ug via INTRAVENOUS

## 2017-10-16 MED ORDER — ACETAMINOPHEN 500 MG PO TABS
1000.0000 mg | ORAL_TABLET | Freq: Four times a day (QID) | ORAL | Status: DC
  Administered 2017-10-16 – 2017-10-17 (×4): 1000 mg via ORAL
  Filled 2017-10-16 (×4): qty 2

## 2017-10-16 MED ORDER — ONDANSETRON 4 MG PO TBDP: 4.0000 mg | ORAL_TABLET | Freq: Four times a day (QID) | ORAL | Status: DC | PRN

## 2017-10-16 MED ORDER — DEXMEDETOMIDINE HCL IN NACL 400 MCG/100ML IV SOLN
INTRAVENOUS | Status: DC | PRN
  Administered 2017-10-16: 0.7 ug/kg/h via INTRAVENOUS

## 2017-10-16 MED ORDER — MIDAZOLAM HCL 2 MG/2ML IJ SOLN
INTRAMUSCULAR | Status: DC | PRN
  Administered 2017-10-16: 2 mg via INTRAVENOUS

## 2017-10-16 MED ORDER — GABAPENTIN 300 MG PO CAPS
300.0000 mg | ORAL_CAPSULE | ORAL | Status: AC
  Administered 2017-10-16: 300 mg via ORAL

## 2017-10-16 MED ORDER — 0.9 % SODIUM CHLORIDE (POUR BTL) OPTIME
TOPICAL | Status: DC | PRN
  Administered 2017-10-16: 1000 mL

## 2017-10-16 MED ORDER — CEFAZOLIN SODIUM-DEXTROSE 2-4 GM/100ML-% IV SOLN
2.0000 g | INTRAVENOUS | Status: AC
  Administered 2017-10-16: 2 g via INTRAVENOUS

## 2017-10-16 MED ORDER — ESMOLOL HCL 100 MG/10ML IV SOLN
INTRAVENOUS | Status: AC
  Filled 2017-10-16: qty 10

## 2017-10-16 MED ORDER — FENTANYL CITRATE (PF) 100 MCG/2ML IJ SOLN: 25.0000 ug | INTRAMUSCULAR | Status: DC | PRN

## 2017-10-16 MED ORDER — DEXAMETHASONE SODIUM PHOSPHATE 10 MG/ML IJ SOLN
INTRAMUSCULAR | Status: DC | PRN
  Administered 2017-10-16: 10 mg via INTRAVENOUS

## 2017-10-16 MED ORDER — ROCURONIUM BROMIDE 10 MG/ML (PF) SYRINGE
PREFILLED_SYRINGE | INTRAVENOUS | Status: DC | PRN
  Administered 2017-10-16: 50 mg via INTRAVENOUS
  Administered 2017-10-16: 20 mg via INTRAVENOUS
  Administered 2017-10-16 (×2): 10 mg via INTRAVENOUS

## 2017-10-16 MED ORDER — BUPIVACAINE LIPOSOME 1.3 % IJ SUSP
20.0000 mL | Freq: Once | INTRAMUSCULAR | Status: DC
  Filled 2017-10-16: qty 20

## 2017-10-16 MED ORDER — ACETAMINOPHEN 500 MG PO TABS
1000.0000 mg | ORAL_TABLET | ORAL | Status: AC
  Administered 2017-10-16: 1000 mg via ORAL

## 2017-10-16 MED ORDER — KCL IN DEXTROSE-NACL 20-5-0.45 MEQ/L-%-% IV SOLN
INTRAVENOUS | Status: DC
  Administered 2017-10-16: 22:00:00 via INTRAVENOUS
  Filled 2017-10-16: qty 1000

## 2017-10-16 MED ORDER — ONDANSETRON HCL 4 MG/2ML IJ SOLN: 4.0000 mg | Freq: Four times a day (QID) | INTRAMUSCULAR | Status: DC | PRN

## 2017-10-16 MED ORDER — PHENYLEPHRINE 40 MCG/ML (10ML) SYRINGE FOR IV PUSH (FOR BLOOD PRESSURE SUPPORT)
PREFILLED_SYRINGE | INTRAVENOUS | Status: DC | PRN
  Administered 2017-10-16: 80 ug via INTRAVENOUS
  Administered 2017-10-16 (×2): 120 ug via INTRAVENOUS
  Administered 2017-10-16 (×2): 80 ug via INTRAVENOUS
  Administered 2017-10-16: 40 ug via INTRAVENOUS

## 2017-10-16 MED ORDER — BUPIVACAINE HCL (PF) 0.25 % IJ SOLN
INTRAMUSCULAR | Status: AC
  Filled 2017-10-16: qty 30

## 2017-10-16 MED ORDER — HYDROCHLOROTHIAZIDE 25 MG PO TABS
25.0000 mg | ORAL_TABLET | Freq: Every day | ORAL | Status: DC
  Administered 2017-10-17: 25 mg via ORAL
  Filled 2017-10-16: qty 1

## 2017-10-16 MED ORDER — SUGAMMADEX SODIUM 200 MG/2ML IV SOLN
INTRAVENOUS | Status: DC | PRN
  Administered 2017-10-16: 200 mg via INTRAVENOUS

## 2017-10-16 MED ORDER — GABAPENTIN 300 MG PO CAPS
ORAL_CAPSULE | ORAL | Status: AC
  Administered 2017-10-16: 300 mg via ORAL
  Filled 2017-10-16: qty 1

## 2017-10-16 MED ORDER — PROPOFOL 10 MG/ML IV BOLUS
INTRAVENOUS | Status: AC
  Filled 2017-10-16: qty 20

## 2017-10-16 MED ORDER — PROPOFOL 10 MG/ML IV BOLUS
INTRAVENOUS | Status: DC | PRN
  Administered 2017-10-16: 140 mg via INTRAVENOUS

## 2017-10-16 MED ORDER — LACTATED RINGERS IV SOLN
INTRAVENOUS | Status: DC
  Administered 2017-10-16: 12:00:00 via INTRAVENOUS

## 2017-10-16 SURGICAL SUPPLY — 61 items
ADH SKN CLS APL DERMABOND .7 (GAUZE/BANDAGES/DRESSINGS) ×1
APL SKNCLS STERI-STRIP NONHPOA (GAUZE/BANDAGES/DRESSINGS) ×1
APPLIER CLIP ROT 10 11.4 M/L (STAPLE)
APR CLP MED LRG 11.4X10 (STAPLE)
BENZOIN TINCTURE PRP APPL 2/3 (GAUZE/BANDAGES/DRESSINGS) ×2 IMPLANT
BINDER ABDOMINAL 12 ML 46-62 (SOFTGOODS) ×2 IMPLANT
BLADE CLIPPER SURG (BLADE) IMPLANT
CANISTER SUCT 3000ML PPV (MISCELLANEOUS) IMPLANT
CLIP APPLIE ROT 10 11.4 M/L (STAPLE) IMPLANT
COVER SURGICAL LIGHT HANDLE (MISCELLANEOUS) ×2 IMPLANT
DERMABOND ADVANCED (GAUZE/BANDAGES/DRESSINGS) ×1
DERMABOND ADVANCED .7 DNX12 (GAUZE/BANDAGES/DRESSINGS) ×1 IMPLANT
DEVICE SECURE STRAP 25 ABSORB (INSTRUMENTS) ×2 IMPLANT
DEVICE TROCAR PUNCTURE CLOSURE (ENDOMECHANICALS) ×2 IMPLANT
DRAIN CHANNEL 19F RND (DRAIN) ×1 IMPLANT
DRAPE INCISE IOBAN 66X45 STRL (DRAPES) ×2 IMPLANT
DRAPE LAPAROSCOPIC ABDOMINAL (DRAPES) ×2 IMPLANT
DRSG PAD ABDOMINAL 8X10 ST (GAUZE/BANDAGES/DRESSINGS) ×1 IMPLANT
ELECT CAUTERY BLADE 6.4 (BLADE) ×1 IMPLANT
ELECT REM PT RETURN 9FT ADLT (ELECTROSURGICAL) ×2
ELECTRODE REM PT RTRN 9FT ADLT (ELECTROSURGICAL) ×1 IMPLANT
EVACUATOR SILICONE 100CC (DRAIN) ×1 IMPLANT
GAUZE SPONGE 4X4 12PLY STRL (GAUZE/BANDAGES/DRESSINGS) ×2 IMPLANT
GLOVE SURG SIGNA 7.5 PF LTX (GLOVE) ×2 IMPLANT
GOWN STRL REUS W/ TWL LRG LVL3 (GOWN DISPOSABLE) ×2 IMPLANT
GOWN STRL REUS W/ TWL XL LVL3 (GOWN DISPOSABLE) ×1 IMPLANT
GOWN STRL REUS W/TWL LRG LVL3 (GOWN DISPOSABLE) ×4
GOWN STRL REUS W/TWL XL LVL3 (GOWN DISPOSABLE) ×2
KIT BASIN OR (CUSTOM PROCEDURE TRAY) ×2 IMPLANT
KIT ROOM TURNOVER OR (KITS) ×2 IMPLANT
MARKER SKIN DUAL TIP RULER LAB (MISCELLANEOUS) ×2 IMPLANT
MESH PARIETEX 20X15 (Mesh General) ×1 IMPLANT
NDL HYPO 25GX1X1/2 BEV (NEEDLE) IMPLANT
NDL SPNL 22GX3.5 QUINCKE BK (NEEDLE) ×1 IMPLANT
NEEDLE HYPO 25GX1X1/2 BEV (NEEDLE) ×2 IMPLANT
NEEDLE SPNL 22GX3.5 QUINCKE BK (NEEDLE) ×2 IMPLANT
NS IRRIG 1000ML POUR BTL (IV SOLUTION) ×2 IMPLANT
PAD ARMBOARD 7.5X6 YLW CONV (MISCELLANEOUS) ×4 IMPLANT
PENCIL BUTTON HOLSTER BLD 10FT (ELECTRODE) ×1 IMPLANT
SET IRRIG TUBING LAPAROSCOPIC (IRRIGATION / IRRIGATOR) IMPLANT
SHEARS HARMONIC ACE PLUS 36CM (ENDOMECHANICALS) ×2 IMPLANT
SLEEVE ENDOPATH XCEL 5M (ENDOMECHANICALS) ×2 IMPLANT
STAPLER VISISTAT 35W (STAPLE) ×1 IMPLANT
SUT ETHILON 2 0 FS 18 (SUTURE) ×4 IMPLANT
SUT MNCRL AB 4-0 PS2 18 (SUTURE) ×1 IMPLANT
SUT MON AB 5-0 PS2 18 (SUTURE) ×2 IMPLANT
SUT NOVA 0 T19/GS 22DT (SUTURE) ×2 IMPLANT
SUT NOVA NAB GS-21 0 18 T12 DT (SUTURE) ×4 IMPLANT
SUT VIC AB 2-0 SH 18 (SUTURE) ×1 IMPLANT
SUT VIC AB 3-0 SH 18 (SUTURE) ×2 IMPLANT
SYR CONTROL 10ML LL (SYRINGE) ×1 IMPLANT
TOWEL OR 17X24 6PK STRL BLUE (TOWEL DISPOSABLE) ×2 IMPLANT
TOWEL OR 17X26 10 PK STRL BLUE (TOWEL DISPOSABLE) ×2 IMPLANT
TRAY LAPAROSCOPIC MC (CUSTOM PROCEDURE TRAY) ×2 IMPLANT
TROCAR ADV FIXATION 5X100MM (TROCAR) ×1 IMPLANT
TROCAR XCEL BLUNT TIP 100MML (ENDOMECHANICALS) IMPLANT
TROCAR XCEL NON-BLD 11X100MML (ENDOMECHANICALS) ×2 IMPLANT
TROCAR XCEL NON-BLD 5MMX100MML (ENDOMECHANICALS) ×2 IMPLANT
TUBE CONNECTING 20X1/4 (TUBING) ×1 IMPLANT
TUBING INSUFFLATION (TUBING) ×2 IMPLANT
YANKAUER SUCT BULB TIP NO VENT (SUCTIONS) ×1 IMPLANT

## 2017-10-16 NOTE — Progress Notes (Signed)
Patient arrived to the floor via bed. VSS. Patient is alert and oriented x4. Abdominal dressing clean, dry and intact. JP secured in place. Orders released. Will continue to monitor patient.

## 2017-10-16 NOTE — Anesthesia Preprocedure Evaluation (Addendum)
Anesthesia Evaluation  Patient identified by MRN, date of birth, ID band Patient awake    Reviewed: Allergy & Precautions, NPO status , Patient's Chart, lab work & pertinent test results  Airway Mallampati: II  TM Distance: >3 FB Neck ROM: Full    Dental  (+) Dental Advisory Given   Pulmonary COPD, Current Smoker,    breath sounds clear to auscultation       Cardiovascular hypertension, Pt. on medications  Rhythm:Regular Rate:Normal     Neuro/Psych negative neurological ROS     GI/Hepatic Neg liver ROS, GERD  ,Ventral hernia    Endo/Other  negative endocrine ROS  Renal/GU negative Renal ROS     Musculoskeletal   Abdominal   Peds  Hematology negative hematology ROS (+)   Anesthesia Other Findings   Reproductive/Obstetrics                            Lab Results  Component Value Date   WBC 4.1 10/14/2017   HGB 11.3 (L) 10/14/2017   HCT 34.9 (L) 10/14/2017   MCV 97.2 10/14/2017   PLT 181 10/14/2017   Lab Results  Component Value Date   CREATININE 1.04 (H) 10/14/2017   BUN 16 10/14/2017   NA 139 10/14/2017   K 4.3 10/14/2017   CL 108 10/14/2017   CO2 25 10/14/2017    Anesthesia Physical Anesthesia Plan  ASA: II  Anesthesia Plan: General   Post-op Pain Management:    Induction: Intravenous  PONV Risk Score and Plan: 3 and Ondansetron, Dexamethasone, Midazolam, Propofol infusion and Treatment may vary due to age or medical condition  Airway Management Planned: Oral ETT  Additional Equipment:   Intra-op Plan:   Post-operative Plan: Extubation in OR  Informed Consent: I have reviewed the patients History and Physical, chart, labs and discussed the procedure including the risks, benefits and alternatives for the proposed anesthesia with the patient or authorized representative who has indicated his/her understanding and acceptance.   Dental advisory given  Plan  Discussed with: CRNA  Anesthesia Plan Comments:        Anesthesia Quick Evaluation

## 2017-10-16 NOTE — Anesthesia Procedure Notes (Signed)
Procedure Name: Intubation Date/Time: 10/16/2017 2:43 PM Performed by: Teressa Lower Pre-anesthesia Checklist: Patient identified, Emergency Drugs available, Suction available and Patient being monitored Patient Re-evaluated:Patient Re-evaluated prior to induction Oxygen Delivery Method: Circle system utilized Preoxygenation: Pre-oxygenation with 100% oxygen Induction Type: IV induction Ventilation: Mask ventilation without difficulty Laryngoscope Size: Mac and 3 Grade View: Grade I Tube type: Oral Tube size: 7.0 mm Number of attempts: 1 Airway Equipment and Method: Stylet and Oral airway Placement Confirmation: ETT inserted through vocal cords under direct vision,  positive ETCO2 and breath sounds checked- equal and bilateral Secured at: 21 cm Tube secured with: Tape Dental Injury: Teeth and Oropharynx as per pre-operative assessment

## 2017-10-16 NOTE — Telephone Encounter (Signed)
Tried to call patient to review overdue health maintenance, however, when I accessed her chart I saw that she is admitted to the hospital currently.

## 2017-10-16 NOTE — Op Note (Addendum)
OPERATIVE NOTE  10/16/2017  8:01 PM  PATIENT:  Vanessa Williamson, 61 y.o., female, MRN: [id number]  PREOP DIAGNOSIS:  Incarcerated ventral hernia  POSTOP DIAGNOSIS:   Incarcerated ventral hernia, approximate 6 x 6 cm fascial defect [photos at end of note]  PROCEDURE:   Procedure(s): Laparoscopic/OPEN VENTRAL HERNIA  REPAIR   SURGEON:   Alphonsa Overall, M.D.  ASSISTANT:   None  ANESTHESIA:   general  Anesthesiologist: Suzette Battiest, MD; Roberts Gaudy, MD CRNA: Teressa Lower., CRNA  General  EBL:  100  ml  BLOOD ADMINISTERED: none  DRAINS: none   LOCAL MEDICATIONS USED:   20 cc of Exparel + 20 cc 1/4% Marcaine  SPECIMEN:   Hernia sac  COUNTS CORRECT:  YES  INDICATIONS FOR PROCEDURE:  Vanessa Williamson is a 61 y.o. (DOB: [birth year]) AA female whose primary care physician is Jaynee Eagles, PA-C and comes for repair of incarcerated ventral hernia.   The indications and risks of the surgery were explained to the patient.  The risks include, but are not limited to, infection, bleeding, and nerve injury.  OPERATIVE NOTE:  The patient was taken to OR room 9 at [facility].  He underwent a general anesthesia.  He was given 2 grams of Ancef at the beginning of the operation.   A time out was held and the surgical checklist run.   The abdomen was prepped with Cholroprep and sterilely draped.  I covered the abdomen with a Ioban drape.   I accessed the LUQ with a 5 mm trocar.  An additional 5 mm trocar was placed in the left mid abdomen.  And a 5 mm trocar was placed in the right upper quadrant.   She had a 6 cm x 6 cm defect in the epigastrium with colon, omentum, and small bowel incarcerated in the hernia..  There was some omentum incarcerated in the defect.   The space created by the hernia in the abdominal wall was so large, I excised an ellipse of skin and the hernia sac.  This was about 12 cm in diameter.  The fascial defect was about 6 cm by 6 cm.  I closed this with  interrupted 0 Novofil sutures.   I placed a 15 x 20 cm Parietex mesh in the abdomen prior to closing the fascia.  It had 8 holding sutures of 0 Novafil.  The mesh was laid transversely (the long dimension transverse) These were tied down to secure the mesh to the anterior abdominal wall.  I then used 35 Securestrap tacks to tack the mesh to the anterior abdominal wall.  I placed 2 additional transfascial sutures of 0 Novofil suture.   The abdomen was deflated.  The mesh was inspected and there were no defects around the edge.   The abdominal wound was closed in layers with 3-0 Vicryl subcutaneous and 2-0 nylon and staples in the skin.  The trocars were then removed.  There was no bleeding at the trocar sites.  The incisions were closed with 4-0 Monocryl and painted with DermaBond. The puncture sites were painted with DermaBond.   I placed a abdominal wall block with a mixture of Exparel and 1/4% Marcaine.   The sponge and needle count were correct.  The patient had an abdominal binder placed.  The patient was transferred to the recovery room in good condition.    Type of repair - both primary suture and mesh  (choices - primary suture, mesh, or component)  Name of  mesh - Parietex  Size of mesh - Length 15 cm, Width 20 cm  Mesh overlap - 7 cm  Placement of mesh - beneath fascia and into the peritoneal cavity  (choices - beneath fascia and into peritoneal cavity, beneath fascia but external to peritoneal cavity, between the muscle and fascia, above or external to fascia)    Abdominal wall hernia prior to repair    Hernia defect    Fascia sutured    Mesh overlay  Alphonsa Overall, MD, [facility] Pager: [phone] Office phone:  [phone]

## 2017-10-16 NOTE — Transfer of Care (Signed)
Immediate Anesthesia Transfer of Care Note  Patient: Vanessa Williamson  Procedure(s) Performed: OPEN VENTRAL HERNIA  REPAIR ERAS PATHWAY (N/A )  Patient Location: PACU  Anesthesia Type:General  Level of Consciousness: drowsy  Airway & Oxygen Therapy: Patient Spontanous Breathing and Patient connected to face mask oxygen  Post-op Assessment: Report given to RN and Post -op Vital signs reviewed and stable  Post vital signs: Reviewed and stable  Last Vitals:  Vitals:   10/16/17 1131  BP: (!) 141/72  Pulse: 96  Resp: 20  Temp: 36.9 C  SpO2: 99%    Last Pain:  Vitals:   10/16/17 1147  TempSrc:   PainSc: 3          Complications: No apparent anesthesia complications

## 2017-10-16 NOTE — Interval H&P Note (Signed)
History and Physical Interval Note:  10/16/2017 2:23 PM  Vanessa Williamson  has presented today for surgery, with the diagnosis of Incarcerated ventral hernia  The various methods of treatment have been discussed with the patient and family.   Has not been able to stop smoking.  Has none healing wound top of skin over hernia.  Has friends here with her.  After consideration of risks, benefits and other options for treatment, the patient has consented to  Procedure(s): Cricket (N/A) as a surgical intervention .  The patient's history has been reviewed, patient examined, no change in status, stable for surgery.  I have reviewed the patient's chart and labs.  Questions were answered to the patient's satisfaction.     Jakevion Arney H

## 2017-10-17 ENCOUNTER — Encounter (HOSPITAL_COMMUNITY): Payer: Self-pay | Admitting: Surgery

## 2017-10-17 DIAGNOSIS — K436 Other and unspecified ventral hernia with obstruction, without gangrene: Secondary | ICD-10-CM | POA: Diagnosis not present

## 2017-10-17 MED ORDER — HYDROCODONE-ACETAMINOPHEN 5-325 MG PO TABS: 1.0000 | ORAL_TABLET | Freq: Four times a day (QID) | ORAL | 0 refills | Status: DC | PRN

## 2017-10-17 MED ORDER — PNEUMOCOCCAL VAC POLYVALENT 25 MCG/0.5ML IJ INJ
0.5000 mL | INJECTION | Freq: Once | INTRAMUSCULAR | Status: AC
  Administered 2017-10-17: 0.5 mL via INTRAMUSCULAR
  Filled 2017-10-17: qty 0.5

## 2017-10-17 NOTE — Progress Notes (Signed)
[facility] Office:  [phone] General Surgery Progress Note   LOS: 1 day  POD -  1 Day Post-Op  Chief Complaint: Ventral hernai  Assessment and Plan: 1.  Laparoscopic/OPEN VENTRAL HERNIA  REPAIR - 10/16/2017 - D. Jasemine Nawaz  Has done well.  Taking liquids well.  Will advance to reg diet and discharge later today.  2.  SMOKES (F17.200)             I have emphasized the importance of quitting smoking. 3. Obesity - BMI 35 Talked about loosing weight before surgery - but this does not need to delay surgery 4. HTN 5. GERD 6.  DVT prophylaxis - SQ Heparin   Active Problems:   Incarcerated ventral hernia   Subjective:  Doing well.  Passed flatus.  Objective:   Vitals:   10/17/17 0153 10/17/17 0600  BP: 130/62 128/66  Pulse: 82 84  Resp: 18 20  Temp: 98.9 F (37.2 C) 98.3 F (36.8 C)  SpO2: 91% 93%     Intake/Output from previous day:  10/25 0701 - 10/26 0700 In: 2958.3 [P.O.:650; I.V.:2058.3; IV Piggyback:250] Out: 590 [Urine:525; Drains:35; Blood:30]  Intake/Output this shift:  No intake/output data recorded.   Physical Exam:   General: Obese AA F who is alert and oriented.    HEENT: Normal. Pupils equal. .   Lungs: Some mild congestion   Abdomen: Soft.  Rare BS.   Wound: In binder. Abdominal drain - 35 cc last 24 hours   Lab Results:    Recent Labs  10/14/17 0830  WBC 4.1  HGB 11.3*  HCT 34.9*  PLT 181    BMET   Recent Labs  10/14/17 0830  NA 139  K 4.3  CL 108  CO2 25  GLUCOSE 105*  BUN 16  CREATININE 1.04*  CALCIUM 9.1    PT/INR  No results for input(s): LABPROT, INR in the last 72 hours.  ABG  No results for input(s): PHART, HCO3 in the last 72 hours.  Invalid input(s): PCO2, PO2   Studies/Results:  No results found.   Anti-infectives:   Anti-infectives    Start     Dose/Rate Route Frequency Ordered Stop   10/16/17 1140  ceFAZolin (ANCEF) 2-4 GM/100ML-% IVPB    Comments:  Tamsen Snider   : cabinet  override      10/16/17 1140 10/16/17 1456   10/16/17 1132  ceFAZolin (ANCEF) IVPB 2g/100 mL premix     2 g 200 mL/hr over 30 Minutes Intravenous On call to O.R. 10/16/17 1132 10/16/17 1456      Alphonsa Overall, MD, FACS Pager: [facility] Office: [phone] 10/17/2017

## 2017-10-17 NOTE — Progress Notes (Signed)
Reviewed discharge instructions with patient, as well as drain education. Patient verbalized understanding of instructions and follow up. Patient left unit in stable condition.

## 2017-10-17 NOTE — Discharge Summary (Signed)
Physician Discharge Summary  Patient ID:  Vanessa Williamson  MRN: [id number]  DOB/AGE: 04/07/1956 61 y.o.  Admit date: 10/16/2017 Discharge date: 10/17/2017  Discharge Diagnoses:  1.  Incarcerated ventral hernia  2.  SMOKES (F17.200)             I have emphasized the importance of quitting smoking.  She is using the nicoderm patch 3. Obesity - BMI 35 4. HTN 5. GERD   Active Problems:   Incarcerated ventral hernia  Operation: Procedure(s): Laparoscopic/OPEN VENTRAL HERNIA  REPAIR  on 10/16/2017 - D. Lucia Gaskins  Discharged Condition: good  Hospital Course: Vanessa Williamson is an 61 y.o. female whose primary care physician is Jaynee Eagles, [place] and who was admitted 10/16/2017 with a chief complaint of Incarcerated ventral hernia.   She was brought to the operating room on 10/16/2017 and underwent Laparoscopic/OPEN VENTRAL HERNIA  REPAIR.   She has done well so far and is ready to go home.  The discharge instructions were reviewed with the patient.  Consults: None  Significant Diagnostic Studies: Results for orders placed or performed during the hospital encounter of 10/14/17  CBC  Result Value Ref Range   WBC 4.1 4.0 - 10.5 K/uL   RBC 3.59 (L) 3.87 - 5.11 MIL/uL   Hemoglobin 11.3 (L) 12.0 - 15.0 g/dL   HCT 34.9 (L) 36.0 - 46.0 %   MCV 97.2 78.0 - 100.0 fL   MCH 31.5 26.0 - 34.0 pg   MCHC 32.4 30.0 - 36.0 g/dL   RDW 13.2 11.5 - 15.5 %   Platelets 181 150 - 400 K/uL  Basic metabolic panel  Result Value Ref Range   Sodium 139 135 - 145 mmol/L   Potassium 4.3 3.5 - 5.1 mmol/L   Chloride 108 101 - 111 mmol/L   CO2 25 22 - 32 mmol/L   Glucose, Bld 105 (H) 65 - 99 mg/dL   BUN 16 6 - 20 mg/dL   Creatinine, Ser 1.04 (H) 0.44 - 1.00 mg/dL   Calcium 9.1 8.9 - 10.3 mg/dL   GFR calc non Af Amer 56 (L) >60 mL/min   GFR calc Af Amer >60 >60 mL/min   Anion gap 6 5 - 15    Ct Abdomen Pelvis Wo Contrast  Result Date: 09/18/2017 CLINICAL DATA:  Evaluate ventral hernia EXAM: CT ABDOMEN  AND PELVIS WITHOUT CONTRAST TECHNIQUE: Multidetector CT imaging of the abdomen and pelvis was performed following the standard protocol without IV contrast. COMPARISON:  None. FINDINGS: Lower chest: No acute abnormality. Hepatobiliary: No focal hepatic abnormality. Gallbladder unremarkable. Pancreas: No focal abnormality or ductal dilatation. Spleen: No focal abnormality.  Normal size. Adrenals/Urinary Tract: No adrenal abnormality. No focal renal abnormality. No stones or hydronephrosis. Urinary bladder is unremarkable. Stomach/Bowel: Normal appendix. No evidence of bowel obstruction. Sigmoid diverticulosis. Vascular/Lymphatic: Diffuse aortic and iliac calcifications. No aneurysm or adenopathy. Reproductive: Uterus and adnexa unremarkable.  No mass. Other: Large ventral hernia, containing cecum, right colon and much of the transverse colon is well is numerous mid to distal small bowel loops. No evidence of obstruction. No free air or free fluid. Musculoskeletal: No acute bony abnormality. IMPRESSION: Large ventral hernia containing right colon and transverse colon as well as numerous small bowel loops. No evidence of bowel obstruction. Sigmoid diverticulosis. Aortoiliac atherosclerosis. Electronically Signed   By: Rolm Baptise M.D.   On: 09/18/2017 08:25    Discharge Exam:  Vitals:   10/17/17 0153 10/17/17 0600  BP: 130/62 128/66  Pulse: 82 84  Resp: 18 20  Temp: 98.9 F (37.2 C) 98.3 F (36.8 C)  SpO2: 91% 93%    General: WN AA F who is alert and generally healthy appearing.  Lungs: Clear to auscultation and symmetric breath sounds. Heart:  RRR. No murmur or rub. Abdomen: Soft. Has bowel sounds.   Drain - 35 cc recorded  Discharge Medications:   Allergies as of 10/17/2017   No Known Allergies     Medication List    TAKE these medications   acetaminophen 500 MG tablet Commonly known as:  TYLENOL Take 1,000 mg by mouth every 6 (six) hours as needed.   hydrochlorothiazide 25 MG  tablet Commonly known as:  HYDRODIURIL Take 1 tablet (25 mg total) by mouth daily.   HYDROcodone-acetaminophen 5-325 MG tablet Commonly known as:  NORCO/VICODIN Take 1-2 tablets by mouth every 6 (six) hours as needed for moderate pain.   ibuprofen 200 MG tablet Commonly known as:  ADVIL,MOTRIN Take 400 mg by mouth every 6 (six) hours as needed for headache or moderate pain.   loratadine 10 MG tablet Commonly known as:  CLARITIN Take 10 mg by mouth daily.   nicotine 14 mg/24hr patch Commonly known as:  NICODERM CQ Place 1 patch (14 mg total) onto the skin daily.   nicotine 21 mg/24hr patch Commonly known as:  NICODERM CQ Place 1 patch (21 mg total) onto the skin daily.       Disposition:   Discharge Instructions    Diet - low sodium heart healthy    Complete by:  As directed    Increase activity slowly    Complete by:  As directed          Signed: Alphonsa Overall, M.D., [facility] Office:  [phone]  10/17/2017, 8:07 AM

## 2017-10-17 NOTE — Discharge Instructions (Signed)
[facility] - DISCHARGE INSTRUCTIONS TO PATIENT  Return to work on:  11/03/2017  Activity:  Driving - May drive in 2 or 3 days, if off pain meds and doing well.   Lifting - No lifting more than 15 pounds for 4 weeks.  Wound Care:   Leave bandage on for 2 more days, then may remove and shower  Diet:  As tolerated  Follow up appointment:  Call Dr. Pollie Friar office [facility]) at [phone] for an appointment in next week.  Medications and dosages:  Resume your home medications.  You have a prescription for:  Vicodin  Call Dr. Lucia Gaskins or his office  [phone]) if you have:  Temperature greater than 100.4,  Persistent nausea and vomiting,  Severe uncontrolled pain,  Redness, tenderness, or signs of infection (pain, swelling, redness, odor or green/yellow discharge around the site),  Difficulty breathing, headache or visual disturbances,  Any other questions or concerns you may have after discharge.  In an emergency, call 911 or go to an Emergency Department at a nearby hospital.

## 2017-10-18 NOTE — Anesthesia Postprocedure Evaluation (Signed)
Anesthesia Post Note  Patient: Vanessa Williamson  Procedure(s) Performed: OPEN VENTRAL HERNIA  REPAIR ERAS PATHWAY (N/A )     Patient location during evaluation: PACU Anesthesia Type: General Level of consciousness: awake, awake and alert and oriented Pain management: pain level controlled Vital Signs Assessment: post-procedure vital signs reviewed and stable Respiratory status: spontaneous breathing, nonlabored ventilation and respiratory function stable Cardiovascular status: blood pressure returned to baseline Anesthetic complications: no    Last Vitals:  Vitals:   10/17/17 1044 10/17/17 1414  BP: 126/64 124/62  Pulse: 86 82  Resp: 20 18  Temp: 36.8 C 36.9 C  SpO2: 97% 96%    Last Pain:  Vitals:   10/17/17 1433  TempSrc:   PainSc: 3                  Rajanae Mantia COKER

## 2018-03-16 ENCOUNTER — Encounter: Payer: Self-pay | Admitting: Gastroenterology

## 2018-07-20 ENCOUNTER — Ambulatory Visit: Payer: 59 | Admitting: Urgent Care

## 2018-07-20 ENCOUNTER — Other Ambulatory Visit: Payer: Self-pay

## 2018-07-20 ENCOUNTER — Ambulatory Visit: Payer: 59 | Admitting: Physician Assistant

## 2018-07-20 ENCOUNTER — Encounter: Payer: Self-pay | Admitting: Urgent Care

## 2018-07-20 VITALS — BP 146/87 | HR 95 | Temp 97.8°F | Resp 18 | Ht 64.0 in | Wt 188.8 lb

## 2018-07-20 DIAGNOSIS — F17200 Nicotine dependence, unspecified, uncomplicated: Secondary | ICD-10-CM | POA: Diagnosis not present

## 2018-07-20 DIAGNOSIS — Z1211 Encounter for screening for malignant neoplasm of colon: Secondary | ICD-10-CM | POA: Diagnosis not present

## 2018-07-20 DIAGNOSIS — I10 Essential (primary) hypertension: Secondary | ICD-10-CM | POA: Diagnosis not present

## 2018-07-20 DIAGNOSIS — R030 Elevated blood-pressure reading, without diagnosis of hypertension: Secondary | ICD-10-CM

## 2018-07-20 DIAGNOSIS — Z1239 Encounter for other screening for malignant neoplasm of breast: Secondary | ICD-10-CM

## 2018-07-20 DIAGNOSIS — Z1231 Encounter for screening mammogram for malignant neoplasm of breast: Secondary | ICD-10-CM

## 2018-07-20 MED ORDER — HYDROCHLOROTHIAZIDE 25 MG PO TABS: 25.0000 mg | ORAL_TABLET | Freq: Every day | ORAL | 3 refills | Status: DC

## 2018-07-20 MED ORDER — AMLODIPINE BESYLATE 5 MG PO TABS: 5.0000 mg | ORAL_TABLET | Freq: Every day | ORAL | 3 refills | Status: DC

## 2018-07-20 NOTE — Patient Instructions (Addendum)
Please check your blood pressure once weekly. Try to check the blood pressure around the same time each day that you check it after a period of resting in a seated position for 5-10 minutes. The readings should be between 371-696 systolic (top number), between 78-93 diastolic (bottom number). If your blood pressure falls outside this range consistently (i.e. 3-4 weeks in a row), then come back for a recheck.   Salads - kale, spinach, cabbage, spring mix; use seeds like pumpkin seeds or sunflower seeds; you can also use 1-2 hard boiled eggs. Fruits - avocadoes, berries (blueberries, raspberries, blackberries), apples, oranges, pomegranate, grapefruit Seeds - quinoa, chia seeds; you can also incorporate oatmeal Vegetables - aspargus, cauliflower, broccoli, green beans, brussel spouts, bell peppers; stay away from starchy vegetables like potatoes, carrots, peas    Managing Your Hypertension Hypertension is commonly called high blood pressure. This is when the force of your blood pressing against the walls of your arteries is too strong. Arteries are blood vessels that carry blood from your heart throughout your body. Hypertension forces the heart to work harder to pump blood, and may cause the arteries to become narrow or stiff. Having untreated or uncontrolled hypertension can cause heart attack, stroke, kidney disease, and other problems. What are blood pressure readings? A blood pressure reading consists of a higher number over a lower number. Ideally, your blood pressure should be below 120/80. The first ("top") number is called the systolic pressure. It is a measure of the pressure in your arteries as your heart beats. The second ("bottom") number is called the diastolic pressure. It is a measure of the pressure in your arteries as the heart relaxes. What does my blood pressure reading mean? Blood pressure is classified into four stages. Based on your blood pressure reading, your health care  provider may use the following stages to determine what type of treatment you need, if any. Systolic pressure and diastolic pressure are measured in a unit called mm Hg. Normal  Systolic pressure: below 810.  Diastolic pressure: below 80. Elevated  Systolic pressure: 175-102.  Diastolic pressure: below 80. Hypertension stage 1  Systolic pressure: 585-277.  Diastolic pressure: 82-42. Hypertension stage 2  Systolic pressure: 353 or above.  Diastolic pressure: 90 or above. What health risks are associated with hypertension? Managing your hypertension is an important responsibility. Uncontrolled hypertension can lead to:  A heart attack.  A stroke.  A weakened blood vessel (aneurysm).  Heart failure.  Kidney damage.  Eye damage.  Metabolic syndrome.  Memory and concentration problems.  What changes can I make to manage my hypertension? Hypertension can be managed by making lifestyle changes and possibly by taking medicines. Your health care provider will help you make a plan to bring your blood pressure within a normal range. Eating and drinking  Eat a diet that is high in fiber and potassium, and low in salt (sodium), added sugar, and fat. An example eating plan is called the DASH (Dietary Approaches to Stop Hypertension) diet. To eat this way: ? Eat plenty of fresh fruits and vegetables. Try to fill half of your plate at each meal with fruits and vegetables. ? Eat whole grains, such as whole wheat pasta, brown rice, or whole grain bread. Fill about one quarter of your plate with whole grains. ? Eat low-fat diary products. ? Avoid fatty cuts of meat, processed or cured meats, and poultry with skin. Fill about one quarter of your plate with lean proteins such as fish, chicken without  skin, beans, eggs, and tofu. ? Avoid premade and processed foods. These tend to be higher in sodium, added sugar, and fat.  Reduce your daily sodium intake. Most people with hypertension  should eat less than 1,500 mg of sodium a day.  Limit alcohol intake to no more than 1 drink a day for nonpregnant women and 2 drinks a day for men. One drink equals 12 oz of beer, 5 oz of wine, or 1 oz of hard liquor. Lifestyle  Work with your health care provider to maintain a healthy body weight, or to lose weight. Ask what an ideal weight is for you.  Get at least 30 minutes of exercise that causes your heart to beat faster (aerobic exercise) most days of the week. Activities may include walking, swimming, or biking.  Include exercise to strengthen your muscles (resistance exercise), such as weight lifting, as part of your weekly exercise routine. Try to do these types of exercises for 30 minutes at least 3 days a week.  Do not use any products that contain nicotine or tobacco, such as cigarettes and e-cigarettes. If you need help quitting, ask your health care provider.  Control any long-term (chronic) conditions you have, such as high cholesterol or diabetes. Monitoring  Monitor your blood pressure at home as told by your health care provider. Your personal target blood pressure may vary depending on your medical conditions, your age, and other factors.  Have your blood pressure checked regularly, as often as told by your health care provider. Working with your health care provider  Review all the medicines you take with your health care provider because there may be side effects or interactions.  Talk with your health care provider about your diet, exercise habits, and other lifestyle factors that may be contributing to hypertension.  Visit your health care provider regularly. Your health care provider can help you create and adjust your plan for managing hypertension. Will I need medicine to control my blood pressure? Your health care provider may prescribe medicine if lifestyle changes are not enough to get your blood pressure under control, and if:  Your systolic blood  pressure is 130 or higher.  Your diastolic blood pressure is 80 or higher.  Take medicines only as told by your health care provider. Follow the directions carefully. Blood pressure medicines must be taken as prescribed. The medicine does not work as well when you skip doses. Skipping doses also puts you at risk for problems. Contact a health care provider if:  You think you are having a reaction to medicines you have taken.  You have repeated (recurrent) headaches.  You feel dizzy.  You have swelling in your ankles.  You have trouble with your vision. Get help right away if:  You develop a severe headache or confusion.  You have unusual weakness or numbness, or you feel faint.  You have severe pain in your chest or abdomen.  You vomit repeatedly.  You have trouble breathing. Summary  Hypertension is when the force of blood pumping through your arteries is too strong. If this condition is not controlled, it may put you at risk for serious complications.  Your personal target blood pressure may vary depending on your medical conditions, your age, and other factors. For most people, a normal blood pressure is less than 120/80.  Hypertension is managed by lifestyle changes, medicines, or both. Lifestyle changes include weight loss, eating a healthy, low-sodium diet, exercising more, and limiting alcohol. This information is not intended  to replace advice given to you by your health care provider. Make sure you discuss any questions you have with your health care provider. Document Released: 09/02/2012 Document Revised: 11/06/2016 Document Reviewed: 11/06/2016 Elsevier Interactive Patient Education  2018 [employer].     IF you received an x-ray today, you will receive an invoice from [facility]. Please contact [facility] at [phone] with questions or concerns regarding your invoice.   IF you received labwork today, you will receive an invoice from  [facility]. Please contact [facility] at [phone] with questions or concerns regarding your invoice.   Our billing staff will not be able to assist you with questions regarding bills from these companies.  You will be contacted with the lab results as soon as they are available. The fastest way to get your results is to activate your My Chart account. Instructions are located on the last page of this paperwork. If you have not heard from [place] regarding the results in 2 weeks, please contact this office.

## 2018-07-20 NOTE — Progress Notes (Signed)
   MRN: [id number] DOB: September 17, 1956  Subjective:   Vanessa Williamson is a 62 y.o. female presenting for follow up on Hypertension. Currently managed with hydrochlorothiazide 25 mg once daily. Patient is checking blood pressure at home, but recent visits to the doctor's office have shown that it has been consistently in the 140s.  She does not actively avoid salt in her diet.  Reports that she has been getting a lot of stress at work from a lot of turnover. Denies dizziness, chronic headache, blurred vision, chest pain, shortness of breath, heart racing, palpitations, nausea, vomiting, abdominal pain, hematuria, lower leg swelling. Has cut down by a half pack per day, is doing 1/2-1ppd.  She is not using the NicoDerm patches and is trying to quit on her own.  Reports that she has made good progress and plans to continue efforts at quitting smoking.  Vanessa Williamson has a current medication list which includes the following prescription(s): hydrochlorothiazide and loratadine. Also has No Known Allergies.  Vanessa Williamson  has a past medical history of Cancer ([place]), GERD (gastroesophageal reflux disease), and Hypertension. Also  has a past surgical history that includes Humerus fracture surgery; Fracture surgery; Cervix lesion destruction; Eye surgery; and Ventral hernia repair (N/A, 10/16/2017).  Objective:   Vitals: BP (!) 146/87   Pulse 95   Temp 97.8 F (36.6 C) (Oral)   Resp 18   Ht 5\' 4"  (1.626 m)   Wt 188 lb 12.8 oz (85.6 kg)   SpO2 95%   BMI 32.41 kg/m   BP Readings from Last 3 Encounters:  07/20/18 (!) 146/87  10/17/17 124/62  10/14/17 (!) 145/65   Physical Exam  Constitutional: She is oriented to person, place, and time. She appears well-developed and well-nourished.  HENT:  Mouth/Throat: Oropharynx is clear and moist.  Eyes: Pupils are equal, round, and reactive to light. EOM are normal. No scleral icterus.  Neck: Normal range of motion. Neck supple. No thyromegaly present.  Cardiovascular: Normal rate,  regular rhythm, normal heart sounds and intact distal pulses. Exam reveals no gallop and no friction rub.  No murmur heard. No carotid bruits.    Pulmonary/Chest: Effort normal and breath sounds normal. No stridor. No respiratory distress. She has no wheezes. She has no rales.  Musculoskeletal: She exhibits no edema.  Neurological: She is alert and oriented to person, place, and time.  Skin: Skin is warm and dry.  Psychiatric: She has a normal mood and affect.   Assessment and Plan :   Essential hypertension - Plan: Comprehensive metabolic panel, Lipid panel, Microalbumin / creatinine urine ratio, hydrochlorothiazide (HYDRODIURIL) 25 MG tablet, DISCONTINUED: hydrochlorothiazide (HYDRODIURIL) 25 MG tablet  Elevated blood pressure reading  Tobacco use disorder  Screen for colon cancer - Plan: Ambulatory referral to Gastroenterology  Screening for breast cancer - Plan: MM Digital Screening  Will start patient on amlodipine in addition to her hydrochlorothiazide.  Labs pending.  Encourage patient to make some dietary modifications that she reports that she does eat some fried food and eats restaurant food some of the time too.  Encouraged patient to continue efforts at smoking cessation.  Referral is pending for screening colonoscopy and mammogram.  Low up in 3 months or sooner as discussed regarding blood pressure parameters.  Jaynee Eagles, PA-C [facility] [phone] 07/20/2018  11:37 AM

## 2018-07-21 LAB — MICROALBUMIN / CREATININE URINE RATIO
CREATININE, UR: 428.1 mg/dL
Microalb/Creat Ratio: 271.3 mg/g creat — ABNORMAL HIGH (ref 0.0–30.0)
Microalbumin, Urine: 1161.3 ug/mL

## 2018-07-21 LAB — LIPID PANEL
CHOL/HDL RATIO: 3.7 ratio (ref 0.0–4.4)
Cholesterol, Total: 157 mg/dL (ref 100–199)
HDL: 42 mg/dL (ref >39)
LDL Calculated: 95 mg/dL (ref 0–99)
Triglycerides: 98 mg/dL (ref 0–149)
VLDL CHOLESTEROL CAL: 20 mg/dL (ref 5–40)

## 2018-07-21 LAB — COMPREHENSIVE METABOLIC PANEL
A/G RATIO: 1.2 (ref 1.2–2.2)
ALBUMIN: 3.6 g/dL (ref 3.6–4.8)
ALT: 4 IU/L (ref 0–32)
AST: 10 IU/L (ref 0–40)
Alkaline Phosphatase: 46 IU/L (ref 39–117)
BILIRUBIN TOTAL: 0.4 mg/dL (ref 0.0–1.2)
BUN/Creatinine Ratio: 20 (ref 12–28)
BUN: 22 mg/dL (ref 8–27)
CO2: 23 mmol/L (ref 20–29)
Calcium: 8.9 mg/dL (ref 8.7–10.3)
Chloride: 105 mmol/L (ref 96–106)
Creatinine, Ser: 1.11 mg/dL — ABNORMAL HIGH (ref 0.57–1.00)
GFR, EST AFRICAN AMERICAN: 62 mL/min/{1.73_m2} (ref >59)
GFR, EST NON AFRICAN AMERICAN: 53 mL/min/{1.73_m2} — AB (ref >59)
GLOBULIN, TOTAL: 2.9 g/dL (ref 1.5–4.5)
Glucose: 106 mg/dL — ABNORMAL HIGH (ref 65–99)
POTASSIUM: 4.3 mmol/L (ref 3.5–5.2)
Sodium: 142 mmol/L (ref 134–144)
TOTAL PROTEIN: 6.5 g/dL (ref 6.0–8.5)

## 2018-07-25 ENCOUNTER — Other Ambulatory Visit: Payer: Self-pay | Admitting: Urgent Care

## 2018-07-25 DIAGNOSIS — R809 Proteinuria, unspecified: Secondary | ICD-10-CM

## 2018-07-25 DIAGNOSIS — R7989 Other specified abnormal findings of blood chemistry: Secondary | ICD-10-CM

## 2018-07-25 DIAGNOSIS — R944 Abnormal results of kidney function studies: Secondary | ICD-10-CM

## 2018-07-25 DIAGNOSIS — I10 Essential (primary) hypertension: Secondary | ICD-10-CM

## 2018-07-31 ENCOUNTER — Encounter: Payer: Self-pay | Admitting: Gastroenterology

## 2018-08-26 ENCOUNTER — Ambulatory Visit (AMBULATORY_SURGERY_CENTER): Payer: Self-pay | Admitting: *Deleted

## 2018-08-26 ENCOUNTER — Encounter: Payer: Self-pay | Admitting: Gastroenterology

## 2018-08-26 VITALS — Ht 64.0 in | Wt 185.0 lb

## 2018-08-26 DIAGNOSIS — Z1211 Encounter for screening for malignant neoplasm of colon: Secondary | ICD-10-CM

## 2018-08-26 MED ORDER — PEG 3350-KCL-NA BICARB-NACL 420 G PO SOLR: 4000.0000 mL | Freq: Once | ORAL | 0 refills | Status: AC

## 2018-08-26 NOTE — Progress Notes (Signed)
Patient denies any allergies to eggs or soy. Patient denies any problems with anesthesia/sedation. Patient denies any oxygen use at home. Patient denies taking any diet/weight loss medications or blood thinners. EMMI education offered, pt declined.  

## 2018-09-08 ENCOUNTER — Ambulatory Visit
Admission: RE | Admit: 2018-09-08 | Discharge: 2018-09-08 | Disposition: A | Discharge status: Home / Self Care | Payer: 59 | Source: Ambulatory Visit | Attending: Urgent Care | Admitting: Urgent Care

## 2018-09-08 DIAGNOSIS — Z1231 Encounter for screening mammogram for malignant neoplasm of breast: Secondary | ICD-10-CM | POA: Diagnosis not present

## 2018-09-08 DIAGNOSIS — Z1239 Encounter for other screening for malignant neoplasm of breast: Secondary | ICD-10-CM

## 2018-09-09 ENCOUNTER — Encounter: Payer: Self-pay | Admitting: Gastroenterology

## 2018-09-09 ENCOUNTER — Other Ambulatory Visit: Payer: Self-pay | Admitting: Urgent Care

## 2018-09-09 ENCOUNTER — Ambulatory Visit (AMBULATORY_SURGERY_CENTER): Payer: 59 | Admitting: Gastroenterology

## 2018-09-09 VITALS — BP 119/48 | HR 95 | Temp 99.3°F | Resp 17 | Ht 64.0 in | Wt 185.0 lb

## 2018-09-09 DIAGNOSIS — R928 Other abnormal and inconclusive findings on diagnostic imaging of breast: Secondary | ICD-10-CM

## 2018-09-09 DIAGNOSIS — Z86010 Personal history of colonic polyps: Secondary | ICD-10-CM | POA: Diagnosis not present

## 2018-09-09 DIAGNOSIS — K5730 Diverticulosis of large intestine without perforation or abscess without bleeding: Secondary | ICD-10-CM | POA: Diagnosis not present

## 2018-09-09 DIAGNOSIS — Z1211 Encounter for screening for malignant neoplasm of colon: Secondary | ICD-10-CM | POA: Diagnosis not present

## 2018-09-09 DIAGNOSIS — J449 Chronic obstructive pulmonary disease, unspecified: Secondary | ICD-10-CM | POA: Diagnosis not present

## 2018-09-09 MED ORDER — SODIUM CHLORIDE 0.9 % IV SOLN: 500.0000 mL | Freq: Once | INTRAVENOUS | Status: DC

## 2018-09-09 NOTE — Op Note (Signed)
[facility] Patient Name: Vanessa Williamson Procedure Date: 09/09/2018 10:17 AM MRN: [id number] Endoscopist: Milus Banister , MD Age: 62 Referring MD:  Date of Birth: April 07, 1956 Gender: Female Account #: [id number] Procedure:                Colonoscopy Indications:              Screening for colorectal malignant neoplasm Medicines:                Monitored Anesthesia Care Procedure:                Pre-Anesthesia Assessment:                           - Prior to the procedure, a History and Physical                            was performed, and patient medications and                            allergies were reviewed. The patient's tolerance of                            previous anesthesia was also reviewed. The risks                            and benefits of the procedure and the sedation                            options and risks were discussed with the patient.                            All questions were answered, and informed consent                            was obtained. Prior Anticoagulants: The patient has                            taken no previous anticoagulant or antiplatelet                            agents. ASA Grade Assessment: II - A patient with                            mild systemic disease. After reviewing the risks                            and benefits, the patient was deemed in                            satisfactory condition to undergo the procedure.                           After obtaining informed consent, the colonoscope  was passed under direct vision. Throughout the                            procedure, the patient's blood pressure, pulse, and                            oxygen saturations were monitored continuously. The                            Model PCF-H190DL [id number]) scope was introduced                            through the anus and advanced to the the cecum,                            identified by  appendiceal orifice and ileocecal                            valve. The colonoscopy was performed without                            difficulty. The patient tolerated the procedure                            well. The quality of the bowel preparation was                            good. The ileocecal valve, appendiceal orifice, and                            rectum were photographed. Scope In: 10:23:43 AM Scope Out: 10:36:08 AM Scope Withdrawal Time: 0 hours 10 minutes 4 seconds  Total Procedure Duration: 0 hours 12 minutes 25 seconds  Findings:                 Multiple small and large-mouthed diverticula were                            found in the left colon.                           Small internal hemorrhoids.                           The exam was otherwise without abnormality on                            direct and retroflexion views. Complications:            No immediate complications. Estimated blood loss:                            None. Estimated Blood Loss:     Estimated blood loss: none. Impression:               - Diverticulosis in the left colon.                           -  Small internal hemorrhoids.                           - The examination was otherwise normal on direct                            and retroflexion views.                           - No polyps or cancers. Recommendation:           - Patient has a contact number available for                            emergencies. The signs and symptoms of potential                            delayed complications were discussed with the                            patient. Return to normal activities tomorrow.                            Written discharge instructions were provided to the                            patient.                           - Resume previous diet.                           - Continue present medications.                           - Repeat colonoscopy in 10 years for screening. Milus Banister, MD 09/09/2018 10:38:48 AM This report has been signed electronically.

## 2018-09-09 NOTE — Patient Instructions (Signed)
YOU HAD AN ENDOSCOPIC PROCEDURE TODAY AT [facility]:   Refer to the procedure report that was given to you for any specific questions about what was found during the examination.  If the procedure report does not answer your questions, please call your gastroenterologist to clarify.  If you requested that your care partner not be given the details of your procedure findings, then the procedure report has been included in a sealed envelope for you to review at your convenience later.  YOU SHOULD EXPECT: Some feelings of bloating in the abdomen. Passage of more gas than usual.  Walking can help get rid of the air that was put into your GI tract during the procedure and reduce the bloating. If you had a lower endoscopy (such as a colonoscopy or flexible sigmoidoscopy) you may notice spotting of blood in your stool or on the toilet paper. If you underwent a bowel prep for your procedure, you may not have a normal bowel movement for a few days.  Please Note:  You might notice some irritation and congestion in your nose or some drainage.  This is from the oxygen used during your procedure.  There is no need for concern and it should clear up in a day or so.  SYMPTOMS TO REPORT IMMEDIATELY:   Following lower endoscopy (colonoscopy or flexible sigmoidoscopy):  Excessive amounts of blood in the stool  Significant tenderness or worsening of abdominal pains  Swelling of the abdomen that is new, acute  Fever of 100F or higher  For urgent or emergent issues, a gastroenterologist can be reached at any hour by calling [phone].   DIET:  We do recommend a small meal at first, but then you may proceed to your regular diet.  Drink plenty of fluids but you should avoid alcoholic beverages for 24 hours.  ACTIVITY:  You should plan to take it easy for the rest of today and you should NOT DRIVE or use heavy machinery until tomorrow (because of the sedation medicines used during the test).     FOLLOW UP: Our staff will call the number listed on your records the next business day following your procedure to check on you and address any questions or concerns that you may have regarding the information given to you following your procedure. If we do not reach you, we will leave a message.  However, if you are feeling well and you are not experiencing any problems, there is no need to return our call.  We will assume that you have returned to your regular daily activities without incident.  If any biopsies were taken you will be contacted by phone or by letter within the next 1-3 weeks.  Please call us at [phone] if you have not heard about the biopsies in 3 weeks.   Repeat next colonoscopy screening in 10 years Diverticulosis (handout given_ Hemorrhoids (handout given)   SIGNATURES/CONFIDENTIALITY: You and/or your care partner have signed paperwork which will be entered into your electronic medical record.  These signatures attest to the fact that that the information above on your After Visit Summary has been reviewed and is understood.  Full responsibility of the confidentiality of this discharge information lies with you and/or your care-partner.

## 2018-09-09 NOTE — Progress Notes (Signed)
Pt's states no medical or surgical changes since previsit or office visit. 

## 2018-09-09 NOTE — Progress Notes (Signed)
Report given to PACU, vss 

## 2018-09-10 ENCOUNTER — Telehealth: Payer: Self-pay

## 2018-09-10 NOTE — Telephone Encounter (Signed)
  Follow up Call-  Call back number 09/09/2018  Post procedure Call Back phone  # [phone]  Permission to leave phone message Yes  Some recent data might be hidden     Patient questions:  Do you have a fever, pain , or abdominal swelling? No. Pain Score  0 *  Have you tolerated food without any problems? Yes.    Have you been able to return to your normal activities? Yes.    Do you have any questions about your discharge instructions: Diet   No. Medications  No. Follow up visit  No.  Do you have questions or concerns about your Care? No.  Actions: * If pain score is 4 or above: No action needed, pain <4.  No problems noted per pt. maw

## 2018-09-14 ENCOUNTER — Ambulatory Visit
Admission: RE | Admit: 2018-09-14 | Discharge: 2018-09-14 | Disposition: A | Discharge status: Home / Self Care | Payer: 59 | Source: Ambulatory Visit | Attending: Urgent Care | Admitting: Urgent Care

## 2018-09-14 ENCOUNTER — Other Ambulatory Visit: Payer: Self-pay | Admitting: Urgent Care

## 2018-09-14 DIAGNOSIS — N6489 Other specified disorders of breast: Secondary | ICD-10-CM | POA: Diagnosis not present

## 2018-09-14 DIAGNOSIS — R928 Other abnormal and inconclusive findings on diagnostic imaging of breast: Secondary | ICD-10-CM

## 2018-09-14 DIAGNOSIS — R599 Enlarged lymph nodes, unspecified: Secondary | ICD-10-CM

## 2018-09-15 ENCOUNTER — Other Ambulatory Visit (HOSPITAL_COMMUNITY)
Admission: RE | Admit: 2018-09-15 | Disposition: A | Discharge status: Home / Self Care | Payer: 59 | Source: Ambulatory Visit | Attending: Diagnostic Radiology | Admitting: Diagnostic Radiology

## 2018-09-15 ENCOUNTER — Ambulatory Visit
Admission: RE | Admit: 2018-09-15 | Discharge: 2018-09-15 | Disposition: A | Discharge status: Home / Self Care | Payer: 59 | Source: Ambulatory Visit | Attending: Urgent Care | Admitting: Urgent Care

## 2018-09-15 ENCOUNTER — Other Ambulatory Visit: Payer: Self-pay | Admitting: Urgent Care

## 2018-09-15 DIAGNOSIS — R599 Enlarged lymph nodes, unspecified: Secondary | ICD-10-CM

## 2018-09-15 DIAGNOSIS — R590 Localized enlarged lymph nodes: Secondary | ICD-10-CM | POA: Diagnosis not present

## 2018-11-09 DIAGNOSIS — I129 Hypertensive chronic kidney disease with stage 1 through stage 4 chronic kidney disease, or unspecified chronic kidney disease: Secondary | ICD-10-CM | POA: Diagnosis not present

## 2018-11-09 DIAGNOSIS — R809 Proteinuria, unspecified: Secondary | ICD-10-CM | POA: Diagnosis not present

## 2018-11-09 DIAGNOSIS — I10 Essential (primary) hypertension: Secondary | ICD-10-CM | POA: Diagnosis not present

## 2018-11-09 DIAGNOSIS — R609 Edema, unspecified: Secondary | ICD-10-CM | POA: Diagnosis not present

## 2018-11-10 DIAGNOSIS — N182 Chronic kidney disease, stage 2 (mild): Secondary | ICD-10-CM | POA: Diagnosis not present

## 2019-02-04 DIAGNOSIS — I129 Hypertensive chronic kidney disease with stage 1 through stage 4 chronic kidney disease, or unspecified chronic kidney disease: Secondary | ICD-10-CM | POA: Diagnosis not present

## 2019-02-04 DIAGNOSIS — R809 Proteinuria, unspecified: Secondary | ICD-10-CM | POA: Diagnosis not present

## 2019-02-04 DIAGNOSIS — R609 Edema, unspecified: Secondary | ICD-10-CM | POA: Diagnosis not present

## 2019-02-04 DIAGNOSIS — Z720 Tobacco use: Secondary | ICD-10-CM | POA: Diagnosis not present

## 2019-04-07 DIAGNOSIS — R809 Proteinuria, unspecified: Secondary | ICD-10-CM | POA: Diagnosis not present

## 2019-04-07 DIAGNOSIS — I129 Hypertensive chronic kidney disease with stage 1 through stage 4 chronic kidney disease, or unspecified chronic kidney disease: Secondary | ICD-10-CM | POA: Diagnosis not present

## 2019-04-07 DIAGNOSIS — R609 Edema, unspecified: Secondary | ICD-10-CM | POA: Diagnosis not present

## 2019-06-21 ENCOUNTER — Other Ambulatory Visit (HOSPITAL_COMMUNITY): Payer: Self-pay | Admitting: *Deleted

## 2019-06-22 ENCOUNTER — Ambulatory Visit (HOSPITAL_COMMUNITY)
Admission: RE | Admit: 2019-06-22 | Discharge: 2019-06-22 | Disposition: A | Discharge status: Home / Self Care | Payer: No Typology Code available for payment source | Source: Ambulatory Visit | Attending: Nephrology | Admitting: Nephrology

## 2019-06-22 ENCOUNTER — Other Ambulatory Visit: Payer: Self-pay

## 2019-06-22 DIAGNOSIS — D631 Anemia in chronic kidney disease: Secondary | ICD-10-CM | POA: Diagnosis present

## 2019-06-22 LAB — POCT HEMOGLOBIN-HEMACUE: Hemoglobin: 8.7 g/dL — ABNORMAL LOW (ref 12.0–15.0)

## 2019-06-22 MED ORDER — SODIUM CHLORIDE 0.9 % IV SOLN
510.0000 mg | INTRAVENOUS | Status: DC
  Administered 2019-06-22: 510 mg via INTRAVENOUS
  Filled 2019-06-22: qty 510

## 2019-06-22 MED ORDER — EPOETIN ALFA-EPBX 10000 UNIT/ML IJ SOLN
20000.0000 [IU] | Freq: Once | INTRAMUSCULAR | Status: DC
  Administered 2019-06-22: 20000 [IU] via SUBCUTANEOUS
  Filled 2019-06-22: qty 2

## 2019-06-22 NOTE — Discharge Instructions (Signed)
  Epoetin Alfa injection What is this medicine? EPOETIN ALFA (e POE e tin AL fa) helps your body make more red blood cells. This medicine is used to treat anemia caused by chronic kidney disease, cancer chemotherapy, or HIV-therapy. It may also be used before surgery if you have anemia. This medicine may be used for other purposes; ask your health care provider or pharmacist if you have questions. COMMON BRAND NAME(S): Epogen, Procrit, Retacrit What should I tell my health care provider before I take this medicine? They need to know if you have any of these conditions:  cancer  heart disease  high blood pressure  history of blood clots  history of stroke  low levels of folate, iron, or vitamin B12 in the blood  seizures  an unusual or allergic reaction to erythropoietin, albumin, benzyl alcohol, hamster proteins, other medicines, foods, dyes, or preservatives  pregnant or trying to get pregnant  breast-feeding How should I use this medicine? This medicine is for injection into a vein or under the skin. It is usually given by a health care professional in a hospital or clinic setting. If you get this medicine at home, you will be taught how to prepare and give this medicine. Use exactly as directed. Take your medicine at regular intervals. Do not take your medicine more often than directed. It is important that you put your used needles and syringes in a special sharps container. Do not put them in a trash can. If you do not have a sharps container, call your pharmacist or healthcare provider to get one. A special MedGuide will be given to you by the pharmacist with each prescription and refill. Be sure to read this information carefully each time. Talk to your pediatrician regarding the use of this medicine in children. While this drug may be prescribed for selected conditions, precautions do apply. Overdosage: If you think you have taken too much of this medicine contact a  poison control center or emergency room at once. NOTE: This medicine is only for you. Do not share this medicine with others. What if I miss a dose? If you miss a dose, take it as soon as you can. If it is almost time for your next dose, take only that dose. Do not take double or extra doses. What may interact with this medicine? Interactions have not been studied. This list may not describe all possible interactions. Give your health care provider a list of all the medicines, herbs, non-prescription drugs, or dietary supplements you use. Also tell them if you smoke, drink alcohol, or use illegal drugs. Some items may interact with your medicine. What should I watch for while using this medicine? Your condition will be monitored carefully while you are receiving this medicine. You may need blood work done while you are taking this medicine. This medicine may cause a decrease in vitamin B6. You should make sure that you get enough vitamin B6 while you are taking this medicine. Discuss the foods you eat and the vitamins you take with your health care professional. What side effects may I notice from receiving this medicine? Side effects that you should report to your doctor or health care professional as soon as possible:  allergic reactions like skin rash, itching or hives, swelling of the face, lips, or tongue  seizures  signs and symptoms of a blood clot such as breathing problems; changes in vision; chest pain; severe, sudden headache; pain, swelling, warmth in the leg; trouble speaking; sudden   numbness or weakness of the face, arm or leg  signs and symptoms of a stroke like changes in vision; confusion; trouble speaking or understanding; severe headaches; sudden numbness or weakness of the face, arm or leg; trouble walking; dizziness; loss of balance or coordination Side effects that usually do not require medical attention (report to your doctor or health care professional if they continue  or are bothersome):  chills  cough  dizziness  fever  headaches  joint pain  muscle cramps  muscle pain  nausea, vomiting  pain, redness, or irritation at site where injected This list may not describe all possible side effects. Call your doctor for medical advice about side effects. You may report side effects to FDA at 1-800-FDA-1088. Where should I keep my medicine? Keep out of the reach of children. Store in a refrigerator between 2 and 8 degrees C (36 and 46 degrees F). Do not freeze or shake. Throw away any unused portion if using a single-dose vial. Multi-dose vials can be kept in the refrigerator for up to 21 days after the initial dose. Throw away unused medicine. NOTE: This sheet is a summary. It may not cover all possible information. If you have questions about this medicine, talk to your doctor, pharmacist, or health care provider.  2020 Elsevier/Gold Standard (2017-07-18 08:35:19) Ferumoxytol injection What is this medicine? FERUMOXYTOL is an iron complex. Iron is used to make healthy red blood cells, which carry oxygen and nutrients throughout the body. This medicine is used to treat iron deficiency anemia. This medicine may be used for other purposes; ask your health care provider or pharmacist if you have questions. COMMON BRAND NAME(S): Feraheme What should I tell my health care provider before I take this medicine? They need to know if you have any of these conditions:  anemia not caused by low iron levels  high levels of iron in the blood  magnetic resonance imaging (MRI) test scheduled  an unusual or allergic reaction to iron, other medicines, foods, dyes, or preservatives  pregnant or trying to get pregnant  breast-feeding How should I use this medicine? This medicine is for injection into a vein. It is given by a health care professional in a hospital or clinic setting. Talk to your pediatrician regarding the use of this medicine in children.  Special care may be needed. Overdosage: If you think you have taken too much of this medicine contact a poison control center or emergency room at once. NOTE: This medicine is only for you. Do not share this medicine with others. What if I miss a dose? It is important not to miss your dose. Call your doctor or health care professional if you are unable to keep an appointment. What may interact with this medicine? This medicine may interact with the following medications:  other iron products This list may not describe all possible interactions. Give your health care provider a list of all the medicines, herbs, non-prescription drugs, or dietary supplements you use. Also tell them if you smoke, drink alcohol, or use illegal drugs. Some items may interact with your medicine. What should I watch for while using this medicine? Visit your doctor or healthcare professional regularly. Tell your doctor or healthcare professional if your symptoms do not start to get better or if they get worse. You may need blood work done while you are taking this medicine. You may need to follow a special diet. Talk to your doctor. Foods that contain iron include: whole grains/cereals, dried fruits,   beans, or peas, leafy green vegetables, and organ meats (liver, kidney). What side effects may I notice from receiving this medicine? Side effects that you should report to your doctor or health care professional as soon as possible:  allergic reactions like skin rash, itching or hives, swelling of the face, lips, or tongue  breathing problems  changes in blood pressure  feeling faint or lightheaded, falls  fever or chills  flushing, sweating, or hot feelings  swelling of the ankles or feet Side effects that usually do not require medical attention (report to your doctor or health care professional if they continue or are bothersome):  diarrhea  headache  nausea, vomiting  stomach pain This list may not  describe all possible side effects. Call your doctor for medical advice about side effects. You may report side effects to FDA at 1-800-FDA-1088. Where should I keep my medicine? This drug is given in a hospital or clinic and will not be stored at home. NOTE: This sheet is a summary. It may not cover all possible information. If you have questions about this medicine, talk to your doctor, pharmacist, or health care provider.  2020 Elsevier/Gold Standard (2017-01-27 20:21:10)  

## 2019-06-28 ENCOUNTER — Other Ambulatory Visit: Payer: Self-pay

## 2019-06-29 ENCOUNTER — Ambulatory Visit (HOSPITAL_COMMUNITY)
Admission: RE | Admit: 2019-06-29 | Discharge: 2019-06-29 | Disposition: A | Discharge status: Home / Self Care | Payer: No Typology Code available for payment source | Source: Ambulatory Visit | Attending: Nephrology | Admitting: Nephrology

## 2019-06-29 DIAGNOSIS — D631 Anemia in chronic kidney disease: Secondary | ICD-10-CM | POA: Diagnosis not present

## 2019-06-29 DIAGNOSIS — N189 Chronic kidney disease, unspecified: Secondary | ICD-10-CM | POA: Diagnosis not present

## 2019-06-29 MED ORDER — SODIUM CHLORIDE 0.9 % IV SOLN
510.0000 mg | INTRAVENOUS | Status: DC
  Administered 2019-06-29: 510 mg via INTRAVENOUS
  Filled 2019-06-29: qty 17

## 2019-07-06 ENCOUNTER — Other Ambulatory Visit: Payer: Self-pay

## 2019-07-06 ENCOUNTER — Ambulatory Visit (HOSPITAL_COMMUNITY)
Admission: RE | Admit: 2019-07-06 | Discharge: 2019-07-06 | Disposition: A | Discharge status: Home / Self Care | Payer: No Typology Code available for payment source | Source: Ambulatory Visit | Attending: Nephrology | Admitting: Nephrology

## 2019-07-06 DIAGNOSIS — D631 Anemia in chronic kidney disease: Secondary | ICD-10-CM | POA: Diagnosis not present

## 2019-07-06 DIAGNOSIS — N182 Chronic kidney disease, stage 2 (mild): Secondary | ICD-10-CM | POA: Diagnosis not present

## 2019-07-06 LAB — POCT HEMOGLOBIN-HEMACUE: Hemoglobin: 9.6 g/dL — ABNORMAL LOW (ref 12.0–15.0)

## 2019-07-06 MED ORDER — EPOETIN ALFA-EPBX 10000 UNIT/ML IJ SOLN
20000.0000 [IU] | Freq: Once | INTRAMUSCULAR | Status: AC
  Administered 2019-07-06: 20000 [IU] via SUBCUTANEOUS
  Filled 2019-07-06: qty 2

## 2019-07-16 ENCOUNTER — Other Ambulatory Visit (HOSPITAL_COMMUNITY): Payer: Self-pay | Admitting: Nephrology

## 2019-07-16 DIAGNOSIS — R609 Edema, unspecified: Secondary | ICD-10-CM

## 2019-07-19 ENCOUNTER — Other Ambulatory Visit (HOSPITAL_COMMUNITY): Payer: Self-pay | Admitting: *Deleted

## 2019-07-20 ENCOUNTER — Encounter: Payer: Self-pay | Admitting: Nephrology

## 2019-07-20 ENCOUNTER — Encounter (HOSPITAL_COMMUNITY)
Admission: RE | Admit: 2019-07-20 | Discharge: 2019-07-20 | Disposition: A | Discharge status: Home / Self Care | Payer: No Typology Code available for payment source | Source: Ambulatory Visit | Attending: Nephrology | Admitting: Nephrology

## 2019-07-20 ENCOUNTER — Other Ambulatory Visit: Payer: Self-pay

## 2019-07-20 ENCOUNTER — Ambulatory Visit (HOSPITAL_COMMUNITY)
Admission: RE | Admit: 2019-07-20 | Discharge: 2019-07-20 | Disposition: A | Discharge status: Home / Self Care | Payer: No Typology Code available for payment source | Source: Ambulatory Visit | Attending: Nephrology | Admitting: Nephrology

## 2019-07-20 DIAGNOSIS — D631 Anemia in chronic kidney disease: Secondary | ICD-10-CM | POA: Insufficient documentation

## 2019-07-20 DIAGNOSIS — N182 Chronic kidney disease, stage 2 (mild): Secondary | ICD-10-CM | POA: Insufficient documentation

## 2019-07-20 DIAGNOSIS — F17200 Nicotine dependence, unspecified, uncomplicated: Secondary | ICD-10-CM | POA: Insufficient documentation

## 2019-07-20 DIAGNOSIS — R609 Edema, unspecified: Secondary | ICD-10-CM | POA: Insufficient documentation

## 2019-07-20 DIAGNOSIS — I129 Hypertensive chronic kidney disease with stage 1 through stage 4 chronic kidney disease, or unspecified chronic kidney disease: Secondary | ICD-10-CM | POA: Insufficient documentation

## 2019-07-20 DIAGNOSIS — I272 Pulmonary hypertension, unspecified: Secondary | ICD-10-CM | POA: Insufficient documentation

## 2019-07-20 DIAGNOSIS — I340 Nonrheumatic mitral (valve) insufficiency: Secondary | ICD-10-CM | POA: Insufficient documentation

## 2019-07-20 LAB — RENAL FUNCTION PANEL
Albumin: 3.3 g/dL — ABNORMAL LOW (ref 3.5–5.0)
Anion gap: 10 (ref 5–15)
BUN: 29 mg/dL — ABNORMAL HIGH (ref 8–23)
CO2: 23 mmol/L (ref 22–32)
Calcium: 8.8 mg/dL — ABNORMAL LOW (ref 8.9–10.3)
Chloride: 106 mmol/L (ref 98–111)
Creatinine, Ser: 1.33 mg/dL — ABNORMAL HIGH (ref 0.44–1.00)
GFR calc Af Amer: 49 mL/min — ABNORMAL LOW (ref >60)
GFR calc non Af Amer: 42 mL/min — ABNORMAL LOW (ref >60)
Glucose, Bld: 92 mg/dL (ref 70–99)
Phosphorus: 4.3 mg/dL (ref 2.5–4.6)
Potassium: 4.3 mmol/L (ref 3.5–5.1)
Sodium: 139 mmol/L (ref 135–145)

## 2019-07-20 LAB — POCT HEMOGLOBIN-HEMACUE: Hemoglobin: 9 g/dL — ABNORMAL LOW (ref 12.0–15.0)

## 2019-07-20 LAB — IRON AND TIBC
Iron: 55 ug/dL (ref 28–170)
Saturation Ratios: 20 % (ref 10.4–31.8)
TIBC: 281 ug/dL (ref 250–450)
UIBC: 226 ug/dL

## 2019-07-20 LAB — FERRITIN: Ferritin: 243 ng/mL (ref 11–307)

## 2019-07-20 MED ORDER — EPOETIN ALFA-EPBX 10000 UNIT/ML IJ SOLN
20000.0000 [IU] | INTRAMUSCULAR | Status: DC
  Administered 2019-07-20: 20000 [IU] via SUBCUTANEOUS
  Filled 2019-07-20: qty 2

## 2019-07-20 NOTE — Progress Notes (Signed)
  Echocardiogram 2D Echocardiogram has been performed.  Vanessa Williamson 07/20/2019, 12:18 PM

## 2019-07-21 LAB — PTH, INTACT AND CALCIUM
Calcium, Total (PTH): 8.5 mg/dL — ABNORMAL LOW (ref 8.7–10.3)
PTH: 51 pg/mL (ref 15–65)

## 2019-08-02 ENCOUNTER — Other Ambulatory Visit (HOSPITAL_COMMUNITY): Payer: Self-pay | Admitting: *Deleted

## 2019-08-02 ENCOUNTER — Other Ambulatory Visit: Payer: Self-pay

## 2019-08-03 ENCOUNTER — Ambulatory Visit (HOSPITAL_COMMUNITY)
Admission: RE | Admit: 2019-08-03 | Discharge: 2019-08-03 | Disposition: A | Discharge status: Home / Self Care | Payer: No Typology Code available for payment source | Source: Ambulatory Visit | Attending: Nephrology | Admitting: Nephrology

## 2019-08-03 DIAGNOSIS — N182 Chronic kidney disease, stage 2 (mild): Secondary | ICD-10-CM | POA: Diagnosis not present

## 2019-08-03 DIAGNOSIS — D631 Anemia in chronic kidney disease: Secondary | ICD-10-CM | POA: Insufficient documentation

## 2019-08-03 LAB — POCT HEMOGLOBIN-HEMACUE: Hemoglobin: 10.1 g/dL — ABNORMAL LOW (ref 12.0–15.0)

## 2019-08-03 MED ORDER — EPOETIN ALFA-EPBX 10000 UNIT/ML IJ SOLN
20000.0000 [IU] | Freq: Once | INTRAMUSCULAR | Status: AC
  Administered 2019-08-03: 13:00:00 20000 [IU] via SUBCUTANEOUS
  Filled 2019-08-03: qty 2

## 2019-08-03 MED ORDER — SODIUM CHLORIDE 0.9 % IV SOLN
510.0000 mg | INTRAVENOUS | Status: DC
  Administered 2019-08-03: 510 mg via INTRAVENOUS
  Filled 2019-08-03: qty 17

## 2019-08-06 ENCOUNTER — Encounter: Payer: Self-pay | Admitting: Cardiology

## 2019-08-09 ENCOUNTER — Encounter: Payer: Self-pay | Admitting: Cardiology

## 2019-08-09 ENCOUNTER — Other Ambulatory Visit: Payer: Self-pay

## 2019-08-09 ENCOUNTER — Ambulatory Visit (INDEPENDENT_AMBULATORY_CARE_PROVIDER_SITE_OTHER): Payer: No Typology Code available for payment source | Admitting: Cardiology

## 2019-08-09 VITALS — BP 165/88 | HR 75 | Temp 98.4°F | Ht 64.0 in | Wt 214.2 lb

## 2019-08-09 DIAGNOSIS — I129 Hypertensive chronic kidney disease with stage 1 through stage 4 chronic kidney disease, or unspecified chronic kidney disease: Secondary | ICD-10-CM

## 2019-08-09 DIAGNOSIS — F17200 Nicotine dependence, unspecified, uncomplicated: Secondary | ICD-10-CM

## 2019-08-09 DIAGNOSIS — I10 Essential (primary) hypertension: Secondary | ICD-10-CM

## 2019-08-09 DIAGNOSIS — I272 Pulmonary hypertension, unspecified: Secondary | ICD-10-CM | POA: Diagnosis not present

## 2019-08-09 DIAGNOSIS — I5033 Acute on chronic diastolic (congestive) heart failure: Secondary | ICD-10-CM | POA: Diagnosis not present

## 2019-08-09 DIAGNOSIS — R0602 Shortness of breath: Secondary | ICD-10-CM | POA: Diagnosis not present

## 2019-08-09 DIAGNOSIS — N183 Chronic kidney disease, stage 3 unspecified: Secondary | ICD-10-CM

## 2019-08-09 DIAGNOSIS — R931 Abnormal findings on diagnostic imaging of heart and coronary circulation: Secondary | ICD-10-CM

## 2019-08-09 MED ORDER — ISOSORBIDE MONONITRATE ER 60 MG PO TB24: 60.0000 mg | ORAL_TABLET | Freq: Every day | ORAL | 2 refills | Status: DC

## 2019-08-09 NOTE — Progress Notes (Signed)
Primary Physician:  Patient, No Pcp Per   Patient ID: Vanessa Williamson, female    DOB: 21-Feb-1956, 63 y.o.   MRN: [id number]  Subjective:    Chief Complaint  Patient presents with  . New Patient (Initial Visit)    c/o worsening SOB  . Edema  . ABN echo    HPI: Vanessa Williamson  is a 63 y.o. female  With hypertension, ongoing tobacco use, CKD stage 3, iron deficiency anemia, referred by Dr. Moshe Cipro for abnormal echocardiogram.   In Jan, she noticed bilateral leg edema and increased weight. She established with Dr. Moshe Cipro due to proteinuria and was also found to be severely anemic that has been treated with regular iron infusions. States that she has had shortness of breath for several years; however, for the last few months she is now unable to walk even short distances without stopping to rest. She uses a wheelchair when she has to go long distances. Does have some PND and productive coughing at night. No orthopnea. No chest pain. Has pain in legs related to leg edema, but denies any claudication.  She does admit to gaining 20 lbs over the last few months; however, mostly in her legs. She attributes this to her leg edema. States that leg elevation helps some.   Blood pressure has recently been uncontrolled. No hyperlipidemia or diabetes. She admits to eating out regularly and is not particularly careful with her salt intake.   She smokes 0.5 pack per day.   Past Medical History:  Diagnosis Date  . Anemia   . Cancer ([place])    cervical  1970s   . Chronic kidney disease   . GERD (gastroesophageal reflux disease)   . Hypertension     Past Surgical History:  Procedure Laterality Date  . BREAST BIOPSY Left    benign  . CERVIX LESION DESTRUCTION     frozen  . COLONOSCOPY  03/09/2008  . EYE SURGERY     laser  . FRACTURE SURGERY    . HUMERUS FRACTURE SURGERY     left  2005  . VENTRAL HERNIA REPAIR N/A 10/16/2017   Procedure: OPEN VENTRAL HERNIA  REPAIR ERAS PATHWAY;   Surgeon: Alphonsa Overall, MD;  Location: [facility];  Service: General;  Laterality: N/A;    Social History   Socioeconomic History  . Marital status: Single    Spouse name: Not on file  . Number of children: 0  . Years of education: Not on file  . Highest education level: Not on file  Occupational History  . Occupation: Building control surveyor: [employer]  . Financial resource strain: Not on file  . Food insecurity    Worry: Not on file    Inability: Not on file  . Transportation needs    Medical: Not on file    Non-medical: Not on file  Tobacco Use  . Smoking status: Current Every Day Smoker    Packs/day: 0.25    Types: Cigarettes  . Smokeless tobacco: Never Used  Substance and Sexual Activity  . Alcohol use: Yes    Comment: rarely  . Drug use: No    Comment: 2005  . Sexual activity: Never    Birth control/protection: None  Lifestyle  . Physical activity    Days per week: Not on file    Minutes per session: Not on file  . Stress: Not on file  Relationships  . Social Herbalist on  phone: Not on file    Gets together: Not on file    Attends religious service: Not on file    Active member of club or organization: Not on file    Attends meetings of clubs or organizations: Not on file    Relationship status: Not on file  . Intimate partner violence    Fear of current or ex partner: Not on file    Emotionally abused: Not on file    Physically abused: Not on file    Forced sexual activity: Not on file  Other Topics Concern  . Not on file  Social History Narrative   Single. Education: [employer].    Review of Systems  Constitution: Negative for decreased appetite, malaise/fatigue, weight gain and weight loss.  Eyes: Negative for visual disturbance.  Cardiovascular: Positive for dyspnea on exertion and leg swelling. Negative for chest pain, claudication, orthopnea, palpitations and syncope.  Respiratory: Negative for hemoptysis and wheezing.    Endocrine: Negative for cold intolerance and heat intolerance.  Hematologic/Lymphatic: Does not bruise/bleed easily.  Skin: Negative for nail changes.  Musculoskeletal: Negative for muscle weakness and myalgias.  Gastrointestinal: Negative for abdominal pain, change in bowel habit, nausea and vomiting.  Neurological: Negative for difficulty with concentration, dizziness, focal weakness and headaches.  Psychiatric/Behavioral: Negative for altered mental status and suicidal ideas.  All other systems reviewed and are negative.     Objective:  Blood pressure (!) 165/88, pulse 75, temperature 98.4 F (36.9 C), height 5\' 4"  (1.626 m), weight 214 lb 3.2 oz (97.2 kg), SpO2 92 %. Body mass index is 36.77 kg/m.    Physical Exam  Constitutional: She is oriented to person, place, and time. Vital signs are normal. She appears well-developed and well-nourished.  HENT:  Head: Normocephalic and atraumatic.  Neck: Normal range of motion. JVD present.  Cardiovascular: Normal rate, regular rhythm, normal heart sounds and intact distal pulses.  Pulses:      Femoral pulses are 0 on the right side and 0 on the left side.      Popliteal pulses are 0 on the right side and 0 on the left side.       Dorsalis pedis pulses are 0 on the right side and 0 on the left side.       Posterior tibial pulses are 0 on the right side and 0 on the left side.  Pulses difficulty to feel due to bodily habitus  Pulmonary/Chest: Effort normal and breath sounds normal. No accessory muscle usage. No respiratory distress.  Abdominal: Soft. Bowel sounds are normal. There is no hepatosplenomegaly.  Umbilical hernia repair  Musculoskeletal: Normal range of motion.  Neurological: She is alert and oriented to person, place, and time.  Skin: Skin is warm and dry.  Vitals reviewed.  Radiology: No results found.  Laboratory examination:    CMP Latest Ref Rng & Units 07/20/2019 07/20/2019 07/20/2018  Glucose 70 - 99 mg/dL 92 -  106(H)  BUN 8 - 23 mg/dL 29(H) - 22  Creatinine 0.44 - 1.00 mg/dL 1.33(H) - 1.11(H)  Sodium 135 - 145 mmol/L 139 - 142  Potassium 3.5 - 5.1 mmol/L 4.3 - 4.3  Chloride 98 - 111 mmol/L 106 - 105  CO2 22 - 32 mmol/L 23 - 23  Calcium 8.7 - 10.3 mg/dL 8.8(L) 8.5(L) 8.9  Total Protein 6.0 - 8.5 g/dL - - 6.5  Total Bilirubin 0.0 - 1.2 mg/dL - - 0.4  Alkaline Phos 39 - 117 IU/L - - 46  AST 0 - 40 IU/L - - 10  ALT 0 - 32 IU/L - - 4   CBC Latest Ref Rng & Units 08/03/2019 07/20/2019 07/06/2019  WBC 4.0 - 10.5 K/uL - - -  Hemoglobin 12.0 - 15.0 g/dL 10.1(L) 9.0(L) 9.6(L)  Hematocrit 36.0 - 46.0 % - - -  Platelets 150 - 400 K/uL - - -   Lipid Panel     Component Value Date/Time   CHOL 157 07/20/2018 1452   TRIG 98 07/20/2018 1452   HDL 42 07/20/2018 1452   CHOLHDL 3.7 07/20/2018 1452   CHOLHDL 4.3 12/10/2013 0856   VLDL 18 12/10/2013 0856   LDLCALC 95 07/20/2018 1452   HEMOGLOBIN A1C No results found for: HGBA1C, MPG TSH No results for input(s): TSH in the last 8760 hours.  PRN Meds:. Medications Discontinued During This Encounter  Medication Reason  . ibuprofen (ADVIL) 200 MG tablet Patient Preference  . hydrochlorothiazide (HYDRODIURIL) 25 MG tablet Discontinued by provider  . amLODipine (NORVASC) 5 MG tablet Change in therapy   Current Meds  Medication Sig  . furosemide (LASIX) 40 MG tablet Take 40 mg by mouth 2 (two) times daily. Takes 2 tablets (80mg ) BID.  Marland Kitchen loratadine (CLARITIN) 10 MG tablet Take 10 mg by mouth daily.  Marland Kitchen losartan-hydrochlorothiazide (HYZAAR) 100-25 MG tablet Take 1 tablet by mouth daily.  . meloxicam (MOBIC) 7.5 MG tablet Take 7.5 mg by mouth as needed for pain. Daily as needed   Current Facility-Administered Medications for the 08/09/19 encounter (Office Visit) with Miquel Dunn, NP  Medication  . 0.9 %  sodium chloride infusion    Cardiac Studies:   Echo 07/12/2019:  1. The left ventricle has low normal systolic function, with an ejection  fraction of 50-55%. The cavity size was mildly dilated. Left ventricular diastolic Doppler parameters are consistent with impaired relaxation. There is right ventricular volume and  pressure overload.  2. The right ventricle has moderately reduced systolic function. The cavity was mildly enlarged. There is no increase in right ventricular wall thickness.  3. Left atrial size was moderately dilated.  4. Right atrial size was moderately dilated.  5. Mild thickening of the mitral valve leaflet. Mitral valve regurgitation is moderate to severe by color flow Doppler.  6. The MV is moderately thickened. There is at least moderate to severe eccentric MR associated with pulmonary HTN and RV pressure/volume overlaod. RVSP 65-51mmHG. Would suggest TEE to further evaluate.  7. The aortic valve is tricuspid. Mild calcification of the aortic valve.  8. Pulmonic valve regurgitation is moderate by color flow Doppler.  9. The aorta is normal in size and structure. 10. The inferior vena cava was dilated in size with >50% respiratory variability.  Assessment:     ICD-10-CM   1. Acute on chronic diastolic heart failure (HCC)  I50.33 EKG 12-Lead    PCV MYOCARDIAL PERFUSION WITH LEXISCAN  2. Pulmonary hypertension (HCC)  I27.20   3. Abnormal echocardiogram  R93.1   4. SOB (shortness of breath)  R06.02 EKG 12-Lead    PCV MYOCARDIAL PERFUSION WITH LEXISCAN  5. Essential hypertension  I10   6. Tobacco use disorder  F17.200   7. CKD (chronic kidney disease) stage 3, GFR 30-59 ml/min (HCC)  N18.3    EKG 08/09/2019: Normal sinus rhythm at 78 bpm, 1 PVC, normal axis, PRWP, T wave inversion in anteroseptal leads, cannot exclude ischemia. Abnormal EKG.  Recommendations:   Recent echocardiogram was reviewed, patient has pulmonary hypertension likely secondary from  diastolic dysfunction from uncontrolled hypertension.  Etiology of this was explained to the patient.  She does have moderate to severe MR by  echocardiogram, but no murmur was appreciated by physical exam.  I suspect poor dietary compliance is also contributing to her symptoms.  She was counseled on the importance of sodium restriction in controlling her symptoms and monitor.  I will start her on isosorbide 60 mg daily.  She will also need stress testing to rule out CAD given her risk factors and symptoms as well.  We will schedule for Lexiscan nuclear stress testing as she is unable to walk on to her dyspnea.  We will continue with Lasix and losartan hydrochlorothiazide.  She is scheduled to see Dr. Moshe Cipro tomorrow, has stage III chronic kidney disease that is been overall stable.  We would like to see her back in 4 weeks for follow-up on CHF and hypertension.  She was also counseled on the importance of smoking cessation, encouraged her to continue to work to completely quit smoking as this is likely also contributing to her pulmonary hypertension.  We will continue to monitor her progress.   *I have discussed this case with Dr. Einar Gip and he personally examined the patient and participated in formulating the plan.*   Miquel Dunn, MSN, APRN, FNP-C [facility]. [place] Office: [phone] Fax: [phone]

## 2019-08-10 ENCOUNTER — Encounter (HOSPITAL_COMMUNITY): Payer: No Typology Code available for payment source

## 2019-08-16 ENCOUNTER — Other Ambulatory Visit (HOSPITAL_COMMUNITY): Payer: Self-pay | Admitting: *Deleted

## 2019-08-17 ENCOUNTER — Ambulatory Visit (HOSPITAL_COMMUNITY)
Admission: RE | Admit: 2019-08-17 | Discharge: 2019-08-17 | Disposition: A | Discharge status: Home / Self Care | Payer: No Typology Code available for payment source | Source: Ambulatory Visit | Attending: Nephrology | Admitting: Nephrology

## 2019-08-17 ENCOUNTER — Other Ambulatory Visit: Payer: Self-pay

## 2019-08-17 DIAGNOSIS — D631 Anemia in chronic kidney disease: Secondary | ICD-10-CM | POA: Diagnosis not present

## 2019-08-17 DIAGNOSIS — N182 Chronic kidney disease, stage 2 (mild): Secondary | ICD-10-CM | POA: Insufficient documentation

## 2019-08-17 LAB — POCT HEMOGLOBIN-HEMACUE: Hemoglobin: 9.9 g/dL — ABNORMAL LOW (ref 12.0–15.0)

## 2019-08-17 LAB — RENAL FUNCTION PANEL
Albumin: 3.2 g/dL — ABNORMAL LOW (ref 3.5–5.0)
Anion gap: 8 (ref 5–15)
BUN: 36 mg/dL — ABNORMAL HIGH (ref 8–23)
CO2: 23 mmol/L (ref 22–32)
Calcium: 8.6 mg/dL — ABNORMAL LOW (ref 8.9–10.3)
Chloride: 108 mmol/L (ref 98–111)
Creatinine, Ser: 1.4 mg/dL — ABNORMAL HIGH (ref 0.44–1.00)
GFR calc Af Amer: 46 mL/min — ABNORMAL LOW (ref >60)
GFR calc non Af Amer: 40 mL/min — ABNORMAL LOW (ref >60)
Glucose, Bld: 90 mg/dL (ref 70–99)
Phosphorus: 4.6 mg/dL (ref 2.5–4.6)
Potassium: 4.5 mmol/L (ref 3.5–5.1)
Sodium: 139 mmol/L (ref 135–145)

## 2019-08-17 MED ORDER — EPOETIN ALFA-EPBX 10000 UNIT/ML IJ SOLN
20000.0000 [IU] | Freq: Once | INTRAMUSCULAR | Status: AC
  Administered 2019-08-17: 20000 [IU] via SUBCUTANEOUS
  Filled 2019-08-17: qty 2

## 2019-08-17 MED ORDER — SODIUM CHLORIDE 0.9 % IV SOLN
510.0000 mg | Freq: Once | INTRAVENOUS | Status: AC
  Administered 2019-08-17: 510 mg via INTRAVENOUS
  Filled 2019-08-17: qty 17

## 2019-08-18 LAB — PTH, INTACT AND CALCIUM
Calcium, Total (PTH): 8.5 mg/dL — ABNORMAL LOW (ref 8.7–10.3)
PTH: 60 pg/mL (ref 15–65)

## 2019-08-31 ENCOUNTER — Other Ambulatory Visit: Payer: Self-pay

## 2019-08-31 ENCOUNTER — Ambulatory Visit (HOSPITAL_COMMUNITY)
Admission: RE | Admit: 2019-08-31 | Discharge: 2019-08-31 | Disposition: A | Discharge status: Home / Self Care | Payer: No Typology Code available for payment source | Source: Ambulatory Visit | Attending: Nephrology | Admitting: Nephrology

## 2019-08-31 VITALS — BP 168/84 | HR 89 | Temp 97.6°F | Resp 18

## 2019-08-31 DIAGNOSIS — D631 Anemia in chronic kidney disease: Secondary | ICD-10-CM | POA: Diagnosis not present

## 2019-08-31 DIAGNOSIS — N182 Chronic kidney disease, stage 2 (mild): Secondary | ICD-10-CM | POA: Insufficient documentation

## 2019-08-31 LAB — IRON AND TIBC
Iron: 47 ug/dL (ref 28–170)
Saturation Ratios: 16 % (ref 10.4–31.8)
TIBC: 302 ug/dL (ref 250–450)
UIBC: 255 ug/dL

## 2019-08-31 LAB — POCT HEMOGLOBIN-HEMACUE: Hemoglobin: 10.5 g/dL — ABNORMAL LOW (ref 12.0–15.0)

## 2019-08-31 LAB — FERRITIN: Ferritin: 425 ng/mL — ABNORMAL HIGH (ref 11–307)

## 2019-08-31 MED ORDER — EPOETIN ALFA-EPBX 10000 UNIT/ML IJ SOLN
20000.0000 [IU] | INTRAMUSCULAR | Status: DC
  Administered 2019-08-31: 20000 [IU] via SUBCUTANEOUS
  Filled 2019-08-31: qty 2

## 2019-09-06 ENCOUNTER — Other Ambulatory Visit: Payer: Self-pay

## 2019-09-06 ENCOUNTER — Ambulatory Visit (INDEPENDENT_AMBULATORY_CARE_PROVIDER_SITE_OTHER): Payer: No Typology Code available for payment source

## 2019-09-06 DIAGNOSIS — I5033 Acute on chronic diastolic (congestive) heart failure: Secondary | ICD-10-CM

## 2019-09-06 DIAGNOSIS — R0602 Shortness of breath: Secondary | ICD-10-CM

## 2019-09-10 ENCOUNTER — Other Ambulatory Visit: Payer: Self-pay | Admitting: Cardiology

## 2019-09-10 ENCOUNTER — Inpatient Hospital Stay (HOSPITAL_COMMUNITY): Payer: No Typology Code available for payment source

## 2019-09-10 ENCOUNTER — Encounter: Payer: Self-pay | Admitting: Cardiology

## 2019-09-10 ENCOUNTER — Other Ambulatory Visit: Payer: Self-pay

## 2019-09-10 ENCOUNTER — Inpatient Hospital Stay (HOSPITAL_COMMUNITY)
Admission: AD | Admit: 2019-09-10 | Discharge: 2019-09-16 | DRG: 286 | Disposition: A | Discharge status: Home Health | Payer: No Typology Code available for payment source | Source: Ambulatory Visit | Attending: Cardiology | Admitting: Cardiology

## 2019-09-10 ENCOUNTER — Ambulatory Visit (HOSPITAL_COMMUNITY): Payer: No Typology Code available for payment source | Admitting: Cardiology

## 2019-09-10 VITALS — BP 150/78 | HR 78 | Temp 97.8°F | Ht 64.0 in | Wt 227.7 lb

## 2019-09-10 DIAGNOSIS — Z720 Tobacco use: Secondary | ICD-10-CM

## 2019-09-10 DIAGNOSIS — Z23 Encounter for immunization: Secondary | ICD-10-CM | POA: Diagnosis not present

## 2019-09-10 DIAGNOSIS — I5043 Acute on chronic combined systolic (congestive) and diastolic (congestive) heart failure: Secondary | ICD-10-CM | POA: Diagnosis present

## 2019-09-10 DIAGNOSIS — Z8541 Personal history of malignant neoplasm of cervix uteri: Secondary | ICD-10-CM | POA: Diagnosis not present

## 2019-09-10 DIAGNOSIS — I340 Nonrheumatic mitral (valve) insufficiency: Secondary | ICD-10-CM | POA: Diagnosis present

## 2019-09-10 DIAGNOSIS — R0602 Shortness of breath: Secondary | ICD-10-CM

## 2019-09-10 DIAGNOSIS — Z791 Long term (current) use of non-steroidal anti-inflammatories (NSAID): Secondary | ICD-10-CM | POA: Diagnosis not present

## 2019-09-10 DIAGNOSIS — Z8349 Family history of other endocrine, nutritional and metabolic diseases: Secondary | ICD-10-CM | POA: Diagnosis not present

## 2019-09-10 DIAGNOSIS — I5033 Acute on chronic diastolic (congestive) heart failure: Secondary | ICD-10-CM

## 2019-09-10 DIAGNOSIS — Z823 Family history of stroke: Secondary | ICD-10-CM | POA: Diagnosis not present

## 2019-09-10 DIAGNOSIS — D509 Iron deficiency anemia, unspecified: Secondary | ICD-10-CM | POA: Diagnosis present

## 2019-09-10 DIAGNOSIS — I2510 Atherosclerotic heart disease of native coronary artery without angina pectoris: Secondary | ICD-10-CM | POA: Diagnosis present

## 2019-09-10 DIAGNOSIS — I272 Pulmonary hypertension, unspecified: Secondary | ICD-10-CM

## 2019-09-10 DIAGNOSIS — R9439 Abnormal result of other cardiovascular function study: Secondary | ICD-10-CM | POA: Diagnosis not present

## 2019-09-10 DIAGNOSIS — I130 Hypertensive heart and chronic kidney disease with heart failure and stage 1 through stage 4 chronic kidney disease, or unspecified chronic kidney disease: Secondary | ICD-10-CM | POA: Diagnosis present

## 2019-09-10 DIAGNOSIS — Z79899 Other long term (current) drug therapy: Secondary | ICD-10-CM | POA: Diagnosis not present

## 2019-09-10 DIAGNOSIS — Z8249 Family history of ischemic heart disease and other diseases of the circulatory system: Secondary | ICD-10-CM | POA: Diagnosis not present

## 2019-09-10 DIAGNOSIS — Z6839 Body mass index (BMI) 39.0-39.9, adult: Secondary | ICD-10-CM

## 2019-09-10 DIAGNOSIS — N183 Chronic kidney disease, stage 3 unspecified: Secondary | ICD-10-CM

## 2019-09-10 DIAGNOSIS — I472 Ventricular tachycardia: Secondary | ICD-10-CM | POA: Diagnosis not present

## 2019-09-10 DIAGNOSIS — J432 Centrilobular emphysema: Secondary | ICD-10-CM | POA: Diagnosis not present

## 2019-09-10 DIAGNOSIS — I2781 Cor pulmonale (chronic): Secondary | ICD-10-CM | POA: Diagnosis present

## 2019-09-10 DIAGNOSIS — R0609 Other forms of dyspnea: Secondary | ICD-10-CM | POA: Diagnosis not present

## 2019-09-10 DIAGNOSIS — F17210 Nicotine dependence, cigarettes, uncomplicated: Secondary | ICD-10-CM | POA: Diagnosis present

## 2019-09-10 DIAGNOSIS — I2609 Other pulmonary embolism with acute cor pulmonale: Secondary | ICD-10-CM | POA: Diagnosis not present

## 2019-09-10 DIAGNOSIS — J449 Chronic obstructive pulmonary disease, unspecified: Secondary | ICD-10-CM | POA: Diagnosis present

## 2019-09-10 DIAGNOSIS — Z9981 Dependence on supplemental oxygen: Secondary | ICD-10-CM

## 2019-09-10 DIAGNOSIS — J9621 Acute and chronic respiratory failure with hypoxia: Secondary | ICD-10-CM | POA: Diagnosis not present

## 2019-09-10 DIAGNOSIS — I5030 Unspecified diastolic (congestive) heart failure: Secondary | ICD-10-CM | POA: Diagnosis present

## 2019-09-10 DIAGNOSIS — I129 Hypertensive chronic kidney disease with stage 1 through stage 4 chronic kidney disease, or unspecified chronic kidney disease: Secondary | ICD-10-CM

## 2019-09-10 DIAGNOSIS — R0902 Hypoxemia: Secondary | ICD-10-CM

## 2019-09-10 DIAGNOSIS — D631 Anemia in chronic kidney disease: Secondary | ICD-10-CM | POA: Diagnosis present

## 2019-09-10 DIAGNOSIS — K219 Gastro-esophageal reflux disease without esophagitis: Secondary | ICD-10-CM | POA: Diagnosis present

## 2019-09-10 DIAGNOSIS — Z20828 Contact with and (suspected) exposure to other viral communicable diseases: Secondary | ICD-10-CM | POA: Diagnosis present

## 2019-09-10 HISTORY — DX: Chronic obstructive pulmonary disease, unspecified: J44.9

## 2019-09-10 LAB — COMPREHENSIVE METABOLIC PANEL
ALT: 9 U/L (ref 0–44)
AST: 12 U/L — ABNORMAL LOW (ref 15–41)
Albumin: 3.1 g/dL — ABNORMAL LOW (ref 3.5–5.0)
Alkaline Phosphatase: 49 U/L (ref 38–126)
Anion gap: 7 (ref 5–15)
BUN: 36 mg/dL — ABNORMAL HIGH (ref 8–23)
CO2: 22 mmol/L (ref 22–32)
Calcium: 8.3 mg/dL — ABNORMAL LOW (ref 8.9–10.3)
Chloride: 108 mmol/L (ref 98–111)
Creatinine, Ser: 1.43 mg/dL — ABNORMAL HIGH (ref 0.44–1.00)
GFR calc Af Amer: 45 mL/min — ABNORMAL LOW (ref >60)
GFR calc non Af Amer: 39 mL/min — ABNORMAL LOW (ref >60)
Glucose, Bld: 114 mg/dL — ABNORMAL HIGH (ref 70–99)
Potassium: 4.2 mmol/L (ref 3.5–5.1)
Sodium: 137 mmol/L (ref 135–145)
Total Bilirubin: 1.1 mg/dL (ref 0.3–1.2)
Total Protein: 6.7 g/dL (ref 6.5–8.1)

## 2019-09-10 LAB — CBC WITH DIFFERENTIAL/PLATELET
Abs Immature Granulocytes: 0 10*3/uL (ref 0.00–0.07)
Basophils Absolute: 0 10*3/uL (ref 0.0–0.1)
Basophils Relative: 1 %
Eosinophils Absolute: 0 10*3/uL (ref 0.0–0.5)
Eosinophils Relative: 0 %
HCT: 28.8 % — ABNORMAL LOW (ref 36.0–46.0)
Hemoglobin: 9.6 g/dL — ABNORMAL LOW (ref 12.0–15.0)
Lymphocytes Relative: 21 %
Lymphs Abs: 0.7 10*3/uL (ref 0.7–4.0)
MCH: 36.9 pg — ABNORMAL HIGH (ref 26.0–34.0)
MCHC: 33.3 g/dL (ref 30.0–36.0)
MCV: 110.8 fL — ABNORMAL HIGH (ref 80.0–100.0)
Monocytes Absolute: 0.1 10*3/uL (ref 0.1–1.0)
Monocytes Relative: 4 %
Neutro Abs: 2.6 10*3/uL (ref 1.7–7.7)
Neutrophils Relative %: 74 %
Platelets: 137 10*3/uL — ABNORMAL LOW (ref 150–400)
RBC: 2.6 MIL/uL — ABNORMAL LOW (ref 3.87–5.11)
RDW: 19.3 % — ABNORMAL HIGH (ref 11.5–15.5)
WBC: 3.5 10*3/uL — ABNORMAL LOW (ref 4.0–10.5)
nRBC: 0 % (ref 0.0–0.2)
nRBC: 0 /100 WBC

## 2019-09-10 LAB — BRAIN NATRIURETIC PEPTIDE: B Natriuretic Peptide: 781.7 pg/mL — ABNORMAL HIGH (ref 0.0–100.0)

## 2019-09-10 MED ORDER — FUROSEMIDE 10 MG/ML IJ SOLN
40.0000 mg | Freq: Three times a day (TID) | INTRAMUSCULAR | Status: DC
  Filled 2019-09-10: qty 4

## 2019-09-10 MED ORDER — ONDANSETRON HCL 4 MG/2ML IJ SOLN: 4.0000 mg | Freq: Four times a day (QID) | INTRAMUSCULAR | Status: DC | PRN

## 2019-09-10 MED ORDER — ENOXAPARIN SODIUM 40 MG/0.4ML ~~LOC~~ SOLN
40.0000 mg | SUBCUTANEOUS | Status: DC
  Administered 2019-09-10 – 2019-09-15 (×5): 40 mg via SUBCUTANEOUS
  Filled 2019-09-10 (×5): qty 0.4

## 2019-09-10 MED ORDER — ACETAMINOPHEN 325 MG PO TABS
650.0000 mg | ORAL_TABLET | ORAL | Status: DC | PRN
  Administered 2019-09-13 – 2019-09-16 (×3): 650 mg via ORAL
  Filled 2019-09-10 (×3): qty 2

## 2019-09-10 MED ORDER — LOSARTAN POTASSIUM 50 MG PO TABS: 100.0000 mg | ORAL_TABLET | Freq: Every day | ORAL | Status: DC

## 2019-09-10 MED ORDER — ISOSORBIDE MONONITRATE ER 60 MG PO TB24
120.0000 mg | ORAL_TABLET | Freq: Every day | ORAL | Status: DC
  Administered 2019-09-10 – 2019-09-14 (×5): 120 mg via ORAL
  Filled 2019-09-10 (×5): qty 2

## 2019-09-10 MED ORDER — METOPROLOL SUCCINATE ER 25 MG PO TB24
25.0000 mg | ORAL_TABLET | Freq: Every day | ORAL | Status: DC
  Administered 2019-09-10 – 2019-09-16 (×7): 25 mg via ORAL
  Filled 2019-09-10 (×7): qty 1

## 2019-09-10 MED ORDER — POTASSIUM CHLORIDE CRYS ER 10 MEQ PO TBCR
10.0000 meq | EXTENDED_RELEASE_TABLET | Freq: Two times a day (BID) | ORAL | Status: DC
  Administered 2019-09-10 – 2019-09-16 (×12): 10 meq via ORAL
  Filled 2019-09-10 (×14): qty 1

## 2019-09-10 MED ORDER — FUROSEMIDE 10 MG/ML IJ SOLN
40.0000 mg | Freq: Three times a day (TID) | INTRAMUSCULAR | Status: DC
  Administered 2019-09-10 – 2019-09-14 (×12): 40 mg via INTRAVENOUS
  Filled 2019-09-10 (×11): qty 4

## 2019-09-10 MED ORDER — ASPIRIN EC 81 MG PO TBEC
81.0000 mg | DELAYED_RELEASE_TABLET | Freq: Every day | ORAL | Status: DC
  Administered 2019-09-10 – 2019-09-16 (×7): 81 mg via ORAL
  Filled 2019-09-10 (×7): qty 1

## 2019-09-10 NOTE — Progress Notes (Unsigned)
Vanessa Williamson is an 63 y.o. female.   Chief Complaint: *** HPI: Vanessa Williamson  is a 63 y.o. female  With ***  Past Medical History:  Diagnosis Date  . Anemia   . Cancer ([place])    cervical  1970s   . Chronic kidney disease   . GERD (gastroesophageal reflux disease)   . Hypertension     Past Surgical History:  Procedure Laterality Date  . BREAST BIOPSY Left    benign  . CERVIX LESION DESTRUCTION     frozen  . COLONOSCOPY  03/09/2008  . EYE SURGERY     laser  . FRACTURE SURGERY    . HUMERUS FRACTURE SURGERY     left  2005  . VENTRAL HERNIA REPAIR N/A 10/16/2017   Procedure: OPEN VENTRAL HERNIA  REPAIR ERAS PATHWAY;  Surgeon: Alphonsa Overall, MD;  Location: [place];  Service: General;  Laterality: N/A;    Family History  Problem Relation Age of Onset  . Hypertension Mother   . Heart disease Mother   . Hyperlipidemia Mother   . Heart disease Father   . Hypertension Father   . Heart disease Maternal Grandmother   . Heart disease Maternal Grandfather   . Cancer Sister   . Stroke Brother   . Hypertension Brother   . Colon cancer Neg Hx   . Colon polyps Neg Hx   . Esophageal cancer Neg Hx   . Stomach cancer Neg Hx   . Rectal cancer Neg Hx    Social History:  reports that she has been smoking cigarettes. She has been smoking about 0.25 packs per day. She has never used smokeless tobacco. She reports current alcohol use. She reports that she does not use drugs.  Allergies: No Known Allergies  Review of Systems - {ros master:310782}  There were no vitals taken for this visit. General appearance: {general exam:16600} Eyes: {eye exam:201::"conjunctivae/corneas clear. PERRL, EOM's intact. Fundi benign."} Neck: {neck exam:17463::"no adenopathy","no carotid bruit","no JVD","supple, symmetrical, trachea midline","thyroid not enlarged, symmetric, no tenderness/mass/nodules"} Neck: JVP - normal, carotids 2+= without bruits Resp: {lung exam:16931} Chest wall: {chest exam:14424::"no  tenderness"} Cardio: {heart exam:5510} GI: {abdominal exam:16834} Extremities: {extremity exam:5109} Pulses: {pulse exam:10866::"2+ and symmetric"} Skin: {skin exam:31329::"Skin color, texture, turgor normal. No rashes or lesions"} Neurologic: {neuro exam:17854}  No results found for this or any previous visit (from the past 48 hour(s)). No results found.  Labs:   Lab Results  Component Value Date   WBC 4.1 10/14/2017   HGB 10.5 (L) 08/31/2019   HCT 34.9 (L) 10/14/2017   MCV 97.2 10/14/2017   PLT 181 10/14/2017   No results for input(s): NA, K, CL, CO2, BUN, CREATININE, CALCIUM, PROT, BILITOT, ALKPHOS, ALT, AST, GLUCOSE in the last 168 hours.  Invalid input(s): LABALBU  Lipid Panel     Component Value Date/Time   CHOL 157 07/20/2018 1452   TRIG 98 07/20/2018 1452   HDL 42 07/20/2018 1452   CHOLHDL 3.7 07/20/2018 1452   CHOLHDL 4.3 12/10/2013 0856   VLDL 18 12/10/2013 0856   LDLCALC 95 07/20/2018 1452    BNP (last 3 results) No results for input(s): BNP in the last 8760 hours.  HEMOGLOBIN A1C No results found for: HGBA1C, MPG  Cardiac Panel (last 3 results) No results for input(s): CKTOTAL, CKMB, TROPONINI, RELINDX in the last 8760 hours.  No results found for: CKTOTAL, CKMB, CKMBINDEX, TROPONINI   TSH No results for input(s): TSH in the last 8760 hours.  EKG: {ekg findings:315101::"normal  EKG, normal sinus rhythm","unchanged from previous tracings"}.  (Not in a hospital admission)     Current Outpatient Medications:  .  furosemide (LASIX) 40 MG tablet, Take 40 mg by mouth 2 (two) times daily. Takes 2 tablets (80mg ) BID., Disp: , Rfl:  .  isosorbide mononitrate (IMDUR) 60 MG 24 hr tablet, Take 1 tablet (60 mg total) by mouth daily., Disp: 30 tablet, Rfl: 2 .  loratadine (CLARITIN) 10 MG tablet, Take 10 mg by mouth daily., Disp: , Rfl:  .  meloxicam (MOBIC) 7.5 MG tablet, Take 7.5 mg by mouth as needed for pain. Daily as needed, Disp: , Rfl:  .   valsartan-hydrochlorothiazide (DIOVAN-HCT) 320-25 MG tablet, Take 1 tablet by mouth daily., Disp: , Rfl:   Current Facility-Administered Medications:  .  0.9 %  sodium chloride infusion, 500 mL, Intravenous, Once, Milus Banister, MD    Assessment/Plan ***  Miquel Dunn, MD 09/10/2019, 3:54 PM [facility]. [place] Pager: [phone] Office: [phone] If no answer: Cell:  [phone]

## 2019-09-10 NOTE — Progress Notes (Signed)
Note entered in error

## 2019-09-10 NOTE — H&P (Signed)
CARDIOLOGY ADMIT NOTE   Vanessa Williamson is an 63 y.o. female.    CC: Shortness of breath and leg edema  HPI: Vanessa Williamson  is a 63 y.o. African-American female with hypertension, ongoing tobacco use disorder, chronic stage III kidney disease, iron deficiency anemia, referred to urgent basis by nephrology Dr. Moshe Cipro, for evaluation of abnormal echocardiogram.  Echocardiogram on 07/12/2019 had revealed normal LV systolic function with grade 1 diastolic dysfunction but there was evidence of right ventricular volume and pressure overload and severe pulmonary hypertension.  She presents today to the office for worsening shortness of breath, worsening leg edema.  States that she is unable to walk even from the parking lot into our main office building without having to stop.  She also desaturated significantly today in the office to oxygen saturation around 85% while walking in the hallway.  No chest pain, no palpitations.  She uses a wheelchair when she has to go long distances. Does have some PND and productive coughing at night. No orthopnea. No chest pain. Has pain in legs related to leg edema, but denies any claudication.  Past Medical History:  Diagnosis Date  . Anemia   . Cancer ([place])    cervical  1970s   . Chronic kidney disease   . GERD (gastroesophageal reflux disease)   . Hypertension     Past Surgical History:  Procedure Laterality Date  . BREAST BIOPSY Left    benign  . CERVIX LESION DESTRUCTION     frozen  . COLONOSCOPY  03/09/2008  . EYE SURGERY     laser  . FRACTURE SURGERY    . HUMERUS FRACTURE SURGERY     left  2005  . VENTRAL HERNIA REPAIR N/A 10/16/2017   Procedure: OPEN VENTRAL HERNIA  REPAIR ERAS PATHWAY;  Surgeon: Alphonsa Overall, MD;  Location: [facility];  Service: General;  Laterality: N/A;    Social History   Socioeconomic History  . Marital status: Single    Spouse name: Not on file  . Number of children: 0  . Years of education: Not on file  .  Highest education level: Not on file  Occupational History  . Occupation: Building control surveyor: [employer]  . Financial resource strain: Not on file  . Food insecurity    Worry: Not on file    Inability: Not on file  . Transportation needs    Medical: Not on file    Non-medical: Not on file  Tobacco Use  . Smoking status: Current Every Day Smoker    Packs/day: 0.25    Types: Cigarettes  . Smokeless tobacco: Never Used  Substance and Sexual Activity  . Alcohol use: Yes    Comment: rarely  . Drug use: No    Comment: 2005  . Sexual activity: Never    Birth control/protection: None  Lifestyle  . Physical activity    Days per week: Not on file    Minutes per session: Not on file  . Stress: Not on file  Relationships  . Social Herbalist on phone: Not on file    Gets together: Not on file    Attends religious service: Not on file    Active member of club or organization: Not on file    Attends meetings of clubs or organizations: Not on file    Relationship status: Not on file  . Intimate partner violence    Fear of current or ex  partner: Not on file    Emotionally abused: Not on file    Physically abused: Not on file    Forced sexual activity: Not on file  Other Topics Concern  . Not on file  Social History Narrative   Single. Education: [employer].    No current facility-administered medications on file prior to encounter.    Current Outpatient Medications on File Prior to Encounter  Medication Sig Dispense Refill  . furosemide (LASIX) 40 MG tablet Take 40 mg by mouth 2 (two) times daily. Takes 2 tablets (80mg ) BID.    Marland Kitchen isosorbide mononitrate (IMDUR) 60 MG 24 hr tablet Take 1 tablet (60 mg total) by mouth daily. 30 tablet 2  . loratadine (CLARITIN) 10 MG tablet Take 10 mg by mouth daily.    . meloxicam (MOBIC) 7.5 MG tablet Take 7.5 mg by mouth as needed for pain. Daily as needed    . valsartan-hydrochlorothiazide (DIOVAN-HCT) 320-25 MG tablet  Take 1 tablet by mouth daily.      No intake/output data recorded.  No intake/output data recorded.   Review of Systems  Constitution: Positive for malaise/fatigue.  Cardiovascular: Positive for dyspnea on exertion and orthopnea.  Respiratory: Positive for cough.   Musculoskeletal: Positive for joint pain.  Gastrointestinal: Positive for bloating.        Objective:  Blood pressure (!) 178/87, pulse 83, temperature 98.1 F (36.7 C), temperature source Oral, resp. rate 20, SpO2 98 %. There is no height or weight on file to calculate BMI. Vitals with BMI 09/10/2019 09/10/2019 09/10/2019  Height - - -  Weight - - -  BMI - - -  Systolic - 485 462  Diastolic - 87 78  Pulse 83 88 78    Blood pressure (!) 178/87, pulse 83, temperature 98.1 F (36.7 C), temperature source Oral, resp. rate 20, SpO2 98 %. There is no height or weight on file to calculate BMI.   Physical Exam  Constitutional:  Moderately built and morbidly obese appears to be in mild respiratory distress.  Appears ill and older than stated age.  HENT:  Head: Atraumatic.  Eyes: Conjunctivae are normal.  Neck: Neck supple. No thyromegaly present.  Cardiovascular: Normal rate, regular rhythm and intact distal pulses. Exam reveals gallop and S4.  No murmur heard. Pulses:      Carotid pulses are 2+ on the right side and 2+ on the left side.      Femoral pulses are 1+ on the right side and 1+ on the left side.      Popliteal pulses are 0 on the right side and 0 on the left side.       Dorsalis pedis pulses are 0 on the right side and 0 on the left side.       Posterior tibial pulses are 0 on the right side and 0 on the left side.  2-3+ bilateral leg edema, pitting.  JVD elevated to the angle of the jaw.  Pulmonary/Chest: Effort normal and breath sounds normal.  Abdominal: Soft. Bowel sounds are normal.  Musculoskeletal: Normal range of motion.  Neurological: She is alert.  Skin: Skin is warm and dry.  Psychiatric: She  has a normal mood and affect.   Radiology: No results found.  Laboratory examination:   Recent Labs    07/20/19 1229 08/17/19 1318  NA 139 139  K 4.3 4.5  CL 106 108  CO2 23 23  GLUCOSE 92 90  BUN 29* 36*  CREATININE 1.33* 1.40*  CALCIUM 8.8*  8.5* 8.6*  8.5*  GFRNONAA 42* 40*  GFRAA 49* 46*   CMP Latest Ref Rng & Units 08/17/2019 08/17/2019 07/20/2019  Glucose 70 - 99 mg/dL 90 - 92  BUN 8 - 23 mg/dL 36(H) - 29(H)  Creatinine 0.44 - 1.00 mg/dL 1.40(H) - 1.33(H)  Sodium 135 - 145 mmol/L 139 - 139  Potassium 3.5 - 5.1 mmol/L 4.5 - 4.3  Chloride 98 - 111 mmol/L 108 - 106  CO2 22 - 32 mmol/L 23 - 23  Calcium 8.7 - 10.3 mg/dL 8.6(L) 8.5(L) 8.8(L)  Total Protein 6.0 - 8.5 g/dL - - -  Total Bilirubin 0.0 - 1.2 mg/dL - - -  Alkaline Phos 39 - 117 IU/L - - -  AST 0 - 40 IU/L - - -  ALT 0 - 32 IU/L - - -   CBC Latest Ref Rng & Units 08/31/2019 08/17/2019 08/03/2019  WBC 4.0 - 10.5 K/uL - - -  Hemoglobin 12.0 - 15.0 g/dL 10.5(L) 9.9(L) 10.1(L)  Hematocrit 36.0 - 46.0 % - - -  Platelets 150 - 400 K/uL - - -   Lipid Panel     Component Value Date/Time   CHOL 157 07/20/2018 1452   TRIG 98 07/20/2018 1452   HDL 42 07/20/2018 1452   CHOLHDL 3.7 07/20/2018 1452   CHOLHDL 4.3 12/10/2013 0856   VLDL 18 12/10/2013 0856   LDLCALC 95 07/20/2018 1452   HEMOGLOBIN A1C No results found for: HGBA1C, MPG TSH No results for input(s): TSH in the last 8760 hours. Medications   Prior to Admission medications   Medication Sig Start Date End Date Taking? Authorizing Provider  furosemide (LASIX) 40 MG tablet Take 40 mg by mouth 2 (two) times daily. Takes 2 tablets (80mg ) BID.    [provider]  isosorbide mononitrate (IMDUR) 60 MG 24 hr tablet Take 1 tablet (60 mg total) by mouth daily. 08/09/19 11/07/19  Miquel Dunn, NP  loratadine (CLARITIN) 10 MG tablet Take 10 mg by mouth daily.    [provider]  meloxicam (MOBIC) 7.5 MG tablet Take 7.5 mg by mouth as  needed for pain. Daily as needed    [provider]  valsartan-hydrochlorothiazide (DIOVAN-HCT) 320-25 MG tablet Take 1 tablet by mouth daily. 08/19/19   [provider]     Current Outpatient Medications  Medication Instructions  . furosemide (LASIX) 40 mg, Oral, 2 times daily, Takes 2 tablets (80mg ) BID.   Marland Kitchen isosorbide mononitrate (IMDUR) 60 mg, Oral, Daily  . loratadine (CLARITIN) 10 mg, Oral, Daily  . meloxicam (MOBIC) 7.5 mg, Oral, As needed, Daily as needed   . valsartan-hydrochlorothiazide (DIOVAN-HCT) 320-25 MG tablet 1 tablet, Oral, Daily   Cardiac Studies:   Lexiscan Sestamibi Stress Test 09/06/2019: 1. Stress EKG is non-diagnostic, as this is pharmacological stress test. 2. The LV is dilated in both rest and stress images with LV EDV of 156 mL. There is very small decrease in uptake in the anterior and apical lateral wall suggestive of very small sized ischemia. Stress LV EF is severely dysfunctional 30% with global hypokinesis.  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 However, balanced ischemia and multivessel CAD cannot be excluded.  3. In addition, the right ventricle appears to be severely dilated.  4. High risk study. No previous exam available for comparison.  Echo 07/12/2019:  1. The left ventricle has low normal systolic function, with an ejection fraction of  50-55%. The cavity size was mildly dilated. Left ventricular diastolic Doppler parameters are consistent with impaired relaxation. There is right ventricular volume and  pressure overload.  2. The right ventricle has moderately reduced systolic function. The cavity was mildly enlarged. There is no increase in right ventricular wall thickness.  3. Left atrial size was moderately dilated.  4. Right atrial size was moderately dilated.  5. Mild thickening of the mitral valve leaflet. Mitral valve regurgitation is moderate to severe by  color flow Doppler.  6. The MV is moderately thickened. There is at least moderate to severe eccentric MR associated with pulmonary HTN and RV pressure/volume overlaod. RVSP 65-25mmHG. Would suggest TEE to further evaluate.  7. The aortic valve is tricuspid. Mild calcification of the aortic valve.  8. Pulmonic valve regurgitation is moderate by color flow Doppler.  9. The aorta is normal in size and structure. 10. The inferior vena cava was dilated in size with >50% respiratory variability.  Assessment     ICD-10-CM   1. Shortness of breath  R06.02 DG Chest 2 View    DG Chest 2 View  2.  Acute on chronic diastolic heart failure 3.  Acute on chronic cor pulmonale with right ventricular systolic heart failure 4.  Severe pulmonary hypertension, suspect WHO group 2 and 3, however needs to have further evaluation to exclude Group 1 especially in view of severity of PH. 5.  Primary hypertension 6.  COPD and tobacco use disorder 7.  Hypoxemia, SPO2 in the office at room air 85%. 8.  Morbid obesity, BMI 39+.  EKG 08/09/2019: Normal sinus rhythm at 78 bpm, 1 PVC, normal axis, PRWP, T wave inversion in anteroseptal leads, cannot exclude ischemia. Abnormal EKG.  Recommendations:   Patient is in acute decompensated heart failure, needs hospital admission.  She needs aggressive diuresis, and further evaluation including right and left heart catheterization.  She also has stage III chronic kidney disease, with diuresis expect worsening of renal function, will hold off on losartan for now until we know further regarding her right ventricular systolic function and pulmonary pressures.  I have again discussed with her regarding smoking cessation.  I will start her on low-dose of beta-blocker, will avoid Coreg in view of COPD.  I will also start her on Imdur 120 mg daily both for right-sided heart failure and hypertension.  Will monitor BNP and BMP on a daily basis.  Adrian Prows, MD, [facility] 09/10/2019, 5:31  PM [facility]. [place] Pager: [phone] Office: [phone] If no answer Cell [phone]

## 2019-09-10 NOTE — Progress Notes (Signed)
Primary Physician:  Patient, No Pcp Per   Patient ID: Vanessa Williamson, female    DOB: February 06, 1956, 63 y.o.   MRN: [id number]  Subjective:    Chief Complaint  Patient presents with  . Pulm HTN  . Results    nuc  . Follow-up    HPI: Vanessa Williamson  is a 63 y.o. female  With hypertension, ongoing tobacco use, CKD stage 3, iron deficiency anemia, recently evaluated by [place] for pulmonary hypertension noted on echocardiogram.    Pulmonary hypertension with RV failure was felt to be potentially related to diastolic dysfunction from uncontrolled hypertension. She was started on Imdur at her last visit. She continues to notice shortness of breath with activity, and is markedly short of breath with walking into our office. States that she having to take frequent breaks. States that swelling and her weight continues to get worse, despite eating very little. She underwent nuclear stress test and now presents for follow up.   She uses a wheelchair when she has to go long distances. Does have some PND and productive coughing at night. No orthopnea. No chest pain. Has pain in legs related to leg edema, but denies any claudication. .   Blood pressure has recently been uncontrolled. No hyperlipidemia or diabetes. She admits to making some changes to her diet.   She smokes 0.5 pack per day.   Past Medical History:  Diagnosis Date  . Anemia   . Cancer ([place])    cervical  1970s   . Chronic kidney disease   . GERD (gastroesophageal reflux disease)   . Hypertension     Past Surgical History:  Procedure Laterality Date  . BREAST BIOPSY Left    benign  . CERVIX LESION DESTRUCTION     frozen  . COLONOSCOPY  03/09/2008  . EYE SURGERY     laser  . FRACTURE SURGERY    . HUMERUS FRACTURE SURGERY     left  2005  . VENTRAL HERNIA REPAIR N/A 10/16/2017   Procedure: OPEN VENTRAL HERNIA  REPAIR ERAS PATHWAY;  Surgeon: Alphonsa Overall, MD;  Location: [facility];  Service: General;  Laterality: N/A;    Social  History   Socioeconomic History  . Marital status: Single    Spouse name: Not on file  . Number of children: 0  . Years of education: Not on file  . Highest education level: Not on file  Occupational History  . Occupation: Building control surveyor: [employer]  . Financial resource strain: Not on file  . Food insecurity    Worry: Not on file    Inability: Not on file  . Transportation needs    Medical: Not on file    Non-medical: Not on file  Tobacco Use  . Smoking status: Current Every Day Smoker    Packs/day: 0.25    Types: Cigarettes  . Smokeless tobacco: Never Used  Substance and Sexual Activity  . Alcohol use: Yes    Comment: rarely  . Drug use: No    Comment: 2005  . Sexual activity: Never    Birth control/protection: None  Lifestyle  . Physical activity    Days per week: Not on file    Minutes per session: Not on file  . Stress: Not on file  Relationships  . Social Herbalist on phone: Not on file    Gets together: Not on file    Attends religious service: Not on  file    Active member of club or organization: Not on file    Attends meetings of clubs or organizations: Not on file    Relationship status: Not on file  . Intimate partner violence    Fear of current or ex partner: Not on file    Emotionally abused: Not on file    Physically abused: Not on file    Forced sexual activity: Not on file  Other Topics Concern  . Not on file  Social History Narrative   Single. Education: [employer].    Review of Systems  Constitution: Negative for decreased appetite, malaise/fatigue, weight gain and weight loss.  Eyes: Negative for visual disturbance.  Cardiovascular: Positive for dyspnea on exertion and leg swelling. Negative for chest pain, claudication, orthopnea, palpitations and syncope.  Respiratory: Negative for hemoptysis and wheezing.   Endocrine: Negative for cold intolerance and heat intolerance.  Hematologic/Lymphatic: Does not  bruise/bleed easily.  Skin: Negative for nail changes.  Musculoskeletal: Negative for muscle weakness and myalgias.  Gastrointestinal: Negative for abdominal pain, change in bowel habit, nausea and vomiting.  Neurological: Negative for difficulty with concentration, dizziness, focal weakness and headaches.  Psychiatric/Behavioral: Negative for altered mental status and suicidal ideas.  All other systems reviewed and are negative.     Objective:  Blood pressure (!) 150/78, pulse 78, temperature 97.8 F (36.6 C), height 5\' 4"  (1.626 m), weight 227 lb 11.2 oz (103.3 kg), SpO2 (!) 85 %. Body mass index is 39.08 kg/m.    Physical Exam  Constitutional: She is oriented to person, place, and time. Vital signs are normal. She appears well-developed and well-nourished.  HENT:  Head: Normocephalic and atraumatic.  Neck: Normal range of motion. JVD present.  Cardiovascular: Normal rate, regular rhythm, normal heart sounds and intact distal pulses.  Pulses:      Femoral pulses are 0 on the right side and 0 on the left side.      Popliteal pulses are 0 on the right side and 0 on the left side.       Dorsalis pedis pulses are 0 on the right side and 0 on the left side.       Posterior tibial pulses are 0 on the right side and 0 on the left side.  Pulses difficulty to feel due to bodily habitus  Pulmonary/Chest: Effort normal and breath sounds normal. No accessory muscle usage. No respiratory distress.  Abdominal: Soft. Bowel sounds are normal. There is no hepatosplenomegaly.  Umbilical hernia repair  Musculoskeletal: Normal range of motion.  Neurological: She is alert and oriented to person, place, and time.  Skin: Skin is warm and dry.  Vitals reviewed.  Radiology: No results found.  Laboratory examination:    CMP Latest Ref Rng & Units 08/17/2019 08/17/2019 07/20/2019  Glucose 70 - 99 mg/dL 90 - 92  BUN 8 - 23 mg/dL 36(H) - 29(H)  Creatinine 0.44 - 1.00 mg/dL 1.40(H) - 1.33(H)  Sodium 135  - 145 mmol/L 139 - 139  Potassium 3.5 - 5.1 mmol/L 4.5 - 4.3  Chloride 98 - 111 mmol/L 108 - 106  CO2 22 - 32 mmol/L 23 - 23  Calcium 8.7 - 10.3 mg/dL 8.6(L) 8.5(L) 8.8(L)  Total Protein 6.0 - 8.5 g/dL - - -  Total Bilirubin 0.0 - 1.2 mg/dL - - -  Alkaline Phos 39 - 117 IU/L - - -  AST 0 - 40 IU/L - - -  ALT 0 - 32 IU/L - - -  CBC Latest Ref Rng & Units 08/31/2019 08/17/2019 08/03/2019  WBC 4.0 - 10.5 K/uL - - -  Hemoglobin 12.0 - 15.0 g/dL 10.5(L) 9.9(L) 10.1(L)  Hematocrit 36.0 - 46.0 % - - -  Platelets 150 - 400 K/uL - - -   Lipid Panel     Component Value Date/Time   CHOL 157 07/20/2018 1452   TRIG 98 07/20/2018 1452   HDL 42 07/20/2018 1452   CHOLHDL 3.7 07/20/2018 1452   CHOLHDL 4.3 12/10/2013 0856   VLDL 18 12/10/2013 0856   LDLCALC 95 07/20/2018 1452   HEMOGLOBIN A1C No results found for: HGBA1C, MPG TSH No results for input(s): TSH in the last 8760 hours.  PRN Meds:. Medications Discontinued During This Encounter  Medication Reason  . losartan-hydrochlorothiazide (HYZAAR) 100-25 MG tablet Change in therapy   Current Meds  Medication Sig  . furosemide (LASIX) 40 MG tablet Take 40 mg by mouth 2 (two) times daily. Takes 2 tablets (80mg ) BID.  Marland Kitchen isosorbide mononitrate (IMDUR) 60 MG 24 hr tablet Take 1 tablet (60 mg total) by mouth daily.  Marland Kitchen loratadine (CLARITIN) 10 MG tablet Take 10 mg by mouth daily.  . meloxicam (MOBIC) 7.5 MG tablet Take 7.5 mg by mouth as needed for pain. Daily as needed  . valsartan-hydrochlorothiazide (DIOVAN-HCT) 320-25 MG tablet Take 1 tablet by mouth daily.  . [DISCONTINUED] losartan-hydrochlorothiazide (HYZAAR) 100-25 MG tablet Take 1 tablet by mouth daily.   Current Facility-Administered Medications for the 09/10/19 encounter (Office Visit) with Miquel Dunn, NP  Medication  . 0.9 %  sodium chloride infusion    Cardiac Studies:   Lexiscan Sestamibi Stress Test 09/06/2019: 1. Stress EKG is non-diagnostic, as this is  pharmacological stress test. 2. The LV is dilated in both rest and stress images with LV EDV of 156 mL. There is very small decrease in uptake in the anterior and apical lateral wall suggestive of very small sized ischemia. Stress LV EF is severely dysfunctional 30% with global hypokinesis.  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 However, balanced ischemia and multivessel CAD cannot be excluded.  3. In addition, the right ventricle appears to be severely dilated.  4. High risk study. No previous exam available for comparison.  Echo 07/12/2019:  1. The left ventricle has low normal systolic function, with an ejection fraction of 50-55%. The cavity size was mildly dilated. Left ventricular diastolic Doppler parameters are consistent with impaired relaxation. There is right ventricular volume and  pressure overload.  2. The right ventricle has moderately reduced systolic function. The cavity was mildly enlarged. There is no increase in right ventricular wall thickness.  3. Left atrial size was moderately dilated.  4. Right atrial size was moderately dilated.  5. Mild thickening of the mitral valve leaflet. Mitral valve regurgitation is moderate to severe by color flow Doppler.  6. The MV is moderately thickened. There is at least moderate to severe eccentric MR associated with pulmonary HTN and RV pressure/volume overlaod. RVSP 65-69mmHG. Would suggest TEE to further evaluate.  7. The aortic valve is tricuspid. Mild calcification of the aortic valve.  8. Pulmonic valve regurgitation is moderate by color flow Doppler.  9. The aorta is normal in size and structure. 10. The inferior vena cava was dilated in size with >50% respiratory variability.  Assessment:     ICD-10-CM   1. Acute on chronic diastolic heart failure (HCC)  I50.33   2. Abnormal nuclear stress  test  R94.39   3. Pulmonary hypertension (HCC)  I27.20   4. SOB  (shortness of breath)  R06.02   5. CKD (chronic kidney disease) stage 3, GFR 30-59 ml/min (HCC)  N18.3    EKG 08/09/2019: Normal sinus rhythm at 78 bpm, 1 PVC, normal axis, PRWP, T wave inversion in anteroseptal leads, cannot exclude ischemia. Abnormal EKG.  Recommendations:   Due to progressively worsening leg edema, shortness of breath, and low Sp02 level, as well as her nuclear stress test findings, I recommended hospital admission for aggressive diuresis and right and left heart catheterization.  We will plan to admit for diuresis over the weekend and plan for right and left heart cath on Monday or Tuesday next week.  Patient is willing and will be directly admitted.  Miquel Dunn, MSN, APRN, FNP-C [facility]. [place] Office: [phone] Fax: [phone]

## 2019-09-11 ENCOUNTER — Encounter (HOSPITAL_COMMUNITY): Payer: Self-pay

## 2019-09-11 LAB — BASIC METABOLIC PANEL
Anion gap: 8 (ref 5–15)
BUN: 37 mg/dL — ABNORMAL HIGH (ref 8–23)
CO2: 22 mmol/L (ref 22–32)
Calcium: 8.3 mg/dL — ABNORMAL LOW (ref 8.9–10.3)
Chloride: 108 mmol/L (ref 98–111)
Creatinine, Ser: 1.35 mg/dL — ABNORMAL HIGH (ref 0.44–1.00)
GFR calc Af Amer: 48 mL/min — ABNORMAL LOW (ref >60)
GFR calc non Af Amer: 41 mL/min — ABNORMAL LOW (ref >60)
Glucose, Bld: 98 mg/dL (ref 70–99)
Potassium: 4.6 mmol/L (ref 3.5–5.1)
Sodium: 138 mmol/L (ref 135–145)

## 2019-09-11 LAB — BRAIN NATRIURETIC PEPTIDE: B Natriuretic Peptide: 843.8 pg/mL — ABNORMAL HIGH (ref 0.0–100.0)

## 2019-09-11 LAB — SARS CORONAVIRUS 2 (TAT 6-24 HRS): SARS Coronavirus 2: NEGATIVE

## 2019-09-11 MED ORDER — ALBUTEROL SULFATE (2.5 MG/3ML) 0.083% IN NEBU
2.5000 mg | INHALATION_SOLUTION | Freq: Three times a day (TID) | RESPIRATORY_TRACT | Status: DC | PRN
  Administered 2019-09-11: 2.5 mg via RESPIRATORY_TRACT
  Filled 2019-09-11: qty 3

## 2019-09-11 MED ORDER — FUROSEMIDE 10 MG/ML IJ SOLN
40.0000 mg | Freq: Once | INTRAMUSCULAR | Status: AC
  Administered 2019-09-11: 40 mg via INTRAVENOUS
  Filled 2019-09-11 (×2): qty 4

## 2019-09-11 MED ORDER — INFLUENZA VAC SPLIT QUAD 0.5 ML IM SUSY
0.5000 mL | PREFILLED_SYRINGE | INTRAMUSCULAR | Status: AC
  Administered 2019-09-12: 0.5 mL via INTRAMUSCULAR
  Filled 2019-09-11: qty 0.5

## 2019-09-11 MED ORDER — DOBUTAMINE IN D5W 4-5 MG/ML-% IV SOLN
2.5000 ug/kg/min | INTRAVENOUS | Status: DC
  Administered 2019-09-11: 2.5 ug/kg/min via INTRAVENOUS
  Administered 2019-09-12: 5 ug/kg/min via INTRAVENOUS
  Administered 2019-09-15: 2.5 ug/kg/min via INTRAVENOUS
  Filled 2019-09-11 (×5): qty 250

## 2019-09-11 MED ORDER — NICOTINE 21 MG/24HR TD PT24
21.0000 mg | MEDICATED_PATCH | Freq: Every day | TRANSDERMAL | Status: DC
  Administered 2019-09-11 – 2019-09-16 (×5): 21 mg via TRANSDERMAL
  Filled 2019-09-11 (×6): qty 1

## 2019-09-11 MED ORDER — METOLAZONE 5 MG PO TABS
5.0000 mg | ORAL_TABLET | Freq: Once | ORAL | Status: AC
  Administered 2019-09-11: 5 mg via ORAL
  Filled 2019-09-11: qty 1

## 2019-09-11 NOTE — Progress Notes (Signed)
Subjective:  No significant change in leg edema.  Shortness of breath is stable.  Denies chest pain.  Intake/Output from previous day:  I/O last 3 completed shifts: In: 34 [P.O.:462] Out: 500 [Urine:500] Total I/O In: 240 [P.O.:240] Out: 600 [Urine:600]  Blood pressure (!) 101/58, pulse 68, temperature 97.7 F (36.5 C), temperature source Oral, resp. rate (!) 22, weight 101.5 kg, SpO2 94 %. Physical Exam  Constitutional:  Moderately built and morbidly obese appears to be in mild respiratory distress.  Appears ill and older than stated age.  HENT:  Head: Atraumatic.  Eyes: Conjunctivae are normal.  Neck: Neck supple. No thyromegaly present.  Cardiovascular: Normal rate, regular rhythm and intact distal pulses. Exam reveals gallop and S4.  No murmur heard. Pulses:      Carotid pulses are 2+ on the right side and 2+ on the left side.      Femoral pulses are 1+ on the right side and 1+ on the left side.      Popliteal pulses are 0 on the right side and 0 on the left side.       Dorsalis pedis pulses are 0 on the right side and 0 on the left side.       Posterior tibial pulses are 0 on the right side and 0 on the left side.  3+ bilateral leg edema, pitting.  JVD elevated to the angle of the jaw.  Pulmonary/Chest: Effort normal and breath sounds normal.  Abdominal: Soft. Bowel sounds are normal.  Musculoskeletal: Normal range of motion.  Neurological: She is alert.  Skin: Skin is warm and dry.  Psychiatric: She has a normal mood and affect.    Lab Results: BMP BNP (last 3 results) Recent Labs    09/10/19 1716 09/11/19 0550  BNP 781.7* 843.8*    ProBNP (last 3 results) No results for input(s): PROBNP in the last 8760 hours. BMP Latest Ref Rng & Units 09/11/2019 09/10/2019 08/17/2019  Glucose 70 - 99 mg/dL 98 114(H) 90  BUN 8 - 23 mg/dL 37(H) 36(H) 36(H)  Creatinine 0.44 - 1.00 mg/dL 1.35(H) 1.43(H) 1.40(H)  BUN/Creat Ratio 12 - 28 - - -  Sodium 135 - 145 mmol/L 138 137  139  Potassium 3.5 - 5.1 mmol/L 4.6 4.2 4.5  Chloride 98 - 111 mmol/L 108 108 108  CO2 22 - 32 mmol/L '22 22 23  '$ Calcium 8.9 - 10.3 mg/dL 8.3(L) 8.3(L) 8.6(L)   Hepatic Function Latest Ref Rng & Units 09/10/2019 08/17/2019 07/20/2019  Total Protein 6.5 - 8.1 g/dL 6.7 - -  Albumin 3.5 - 5.0 g/dL 3.1(L) 3.2(L) 3.3(L)  AST 15 - 41 U/L 12(L) - -  ALT 0 - 44 U/L 9 - -  Alk Phosphatase 38 - 126 U/L 49 - -  Total Bilirubin 0.3 - 1.2 mg/dL 1.1 - -   CBC Latest Ref Rng & Units 09/10/2019 08/31/2019 08/17/2019  WBC 4.0 - 10.5 K/uL 3.5(L) - -  Hemoglobin 12.0 - 15.0 g/dL 9.6(L) 10.5(L) 9.9(L)  Hematocrit 36.0 - 46.0 % 28.8(L) - -  Platelets 150 - 400 K/uL 137(L) - -   Lipid Panel     Component Value Date/Time   CHOL 157 07/20/2018 1452   TRIG 98 07/20/2018 1452   HDL 42 07/20/2018 1452   CHOLHDL 3.7 07/20/2018 1452   CHOLHDL 4.3 12/10/2013 0856   VLDL 18 12/10/2013 0856   LDLCALC 95 07/20/2018 1452   Cardiac Panel (last 3 results) No results for input(s): CKTOTAL, CKMB, TROPONINI,  RELINDX in the last 72 hours.  HEMOGLOBIN A1C No results found for: HGBA1C, MPG TSH No results for input(s): TSH in the last 8760 hours. Imaging: :Imaging results have been reviewed  Cardiac Studies:  Lexiscan Sestamibi Stress Test 09/06/2019: 1. Stress EKG is non-diagnostic, as this is pharmacological stress test. 2. The LV is dilated in both rest and stress images with LV EDV of 156 mL. There is very small decrease in uptake in the anterior and apical lateral wall suggestive of very small sized ischemia. Stress LV EF is severely dysfunctional 30% with global hypokinesis.  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 However, balanced ischemia and multivessel CAD cannot be excluded.  3. In addition, the right ventricle appears to be severely dilated.  4. High risk study. No previous exam available for comparison.  Echo 07/12/2019:  1. The left  ventricle has low normal systolic function, with an ejection fraction of 50-55%. The cavity size was mildly dilated. Left ventricular diastolic Doppler parameters are consistent with impaired relaxation. There is right ventricular volume and  pressure overload.  2. The right ventricle has moderately reduced systolic function. The cavity was mildly enlarged. There is no increase in right ventricular wall thickness.  3. Left atrial size was moderately dilated.  4. Right atrial size was moderately dilated.  5. Mild thickening of the mitral valve leaflet. Mitral valve regurgitation is moderate to severe by color flow Doppler.  6. The MV is moderately thickened. There is at least moderate to severe eccentric MR associated with pulmonary HTN and RV pressure/volume overlaod. RVSP 65-80mHG. Would suggest TEE to further evaluate.  7. The aortic valve is tricuspid. Mild calcification of the aortic valve.  8. Pulmonic valve regurgitation is moderate by color flow Doppler.  9. The aorta is normal in size and structure. 10. The inferior vena cava was dilated in size with >50% respiratory variability.  Scheduled Meds: . aspirin EC  81 mg Oral Daily  . enoxaparin (LOVENOX) injection  40 mg Subcutaneous Q24H  . furosemide  40 mg Intravenous TID  . [START ON 09/12/2019] influenza vac split quadrivalent PF  0.5 mL Intramuscular Tomorrow-1000  . isosorbide mononitrate  120 mg Oral Daily  . metoprolol succinate  25 mg Oral Daily  . nicotine  21 mg Transdermal Daily  . potassium chloride  10 mEq Oral BID   Continuous Infusions: PRN Meds:.acetaminophen, albuterol, ondansetron (ZOFRAN) IV  Assessment/Plan:  1. Shortness of breath         2.  Acute on chronic diastolic heart failure 3.  Acute on chronic cor pulmonale with right ventricular systolic heart failure 4.  Severe pulmonary hypertension, suspect WHO group 2 and 3, however needs to have further evaluation to exclude Group 1 especially in view of  severity of PH. 5.  Primary hypertension 6.  COPD and tobacco use disorder 7.  Hypoxemia, SPO2 in the office at room air 85%. 8.  Morbid obesity, BMI 39+. 9.  NSVT, one episode of 4 beat on telemetry.  Recommendation: Patient has not shown any significant diuresis in spite of intravenous furosemide.  Most of her symptoms of heart failure are coming from right-sided heart failure.  Echocardiogram also reveals moderate RV systolic dysfunction, I suspect her RV is probably more depressed now.  I will start the patient on low-dose dobutamine and see whether she would respond to this.  We will continue to watch her electrolytes closely, monitor her BMP and BNP.  Hopefully dobutamine will not cause worsening ventricular arrhythmias.  But there does not appear to be any other options.  Predominant heart failure is RV dysfunction. Blood pressure is well controlled on the present medical regimen including isosorbide mononitrate and low-dose beta-blocker.  Continue oxygen supplementation.  Adrian Prows, M.D. 09/11/2019, 1:49 PM [facility], [place] Pager: [phone] Office: [phone] If no answer: [phone]

## 2019-09-11 NOTE — Progress Notes (Signed)
Unable to give IV lasix because patient IV out of arm.  Unable to find a good enough vein and patient is a hard stick.  IV team consult placed.  Will give lasix as soon as access is available.

## 2019-09-11 NOTE — Progress Notes (Signed)
CCMD notified of patient new onset of ventricular trigeminy.  RN completed EKG on patient.  First EKG was inaccurate due to movement.  2nd EKG completed and showed SR with frequent PVC's. Patient also with continuing [place] with rales and minimal urine output after 40 of IV lasix given.  Bladder scan showed maximum 14ml.  Patient also has strong nonproductive cough.  RN paged patient Cardiologist Einar Gip, MD.  MD gave verbal orders for 40mg  IV lasix once, 5mg  zaroxolyn PO once, a daily nicotine patch 21mg , and prn albuterol nebulizer tid.  RN placed verbal orders.

## 2019-09-11 NOTE — Progress Notes (Signed)
Called Dr. Einar Gip regarding Dobutamine gtt orders modified per MD. Re evaluate depending pt response.

## 2019-09-11 NOTE — Progress Notes (Signed)
Nurse spoke with IV team regarding DOBUtamine iv drip administration. Per IV team Dobutamine drip can be administered through peripheral iv (placed today, see flowsheets) for 48 hours, if it's longer than 48 hours a PICC is required for this medication administration.

## 2019-09-12 ENCOUNTER — Inpatient Hospital Stay: Payer: Self-pay

## 2019-09-12 LAB — BASIC METABOLIC PANEL
Anion gap: 9 (ref 5–15)
BUN: 41 mg/dL — ABNORMAL HIGH (ref 8–23)
CO2: 22 mmol/L (ref 22–32)
Calcium: 8.1 mg/dL — ABNORMAL LOW (ref 8.9–10.3)
Chloride: 109 mmol/L (ref 98–111)
Creatinine, Ser: 1.56 mg/dL — ABNORMAL HIGH (ref 0.44–1.00)
GFR calc Af Amer: 40 mL/min — ABNORMAL LOW (ref >60)
GFR calc non Af Amer: 35 mL/min — ABNORMAL LOW (ref >60)
Glucose, Bld: 91 mg/dL (ref 70–99)
Potassium: 4.7 mmol/L (ref 3.5–5.1)
Sodium: 140 mmol/L (ref 135–145)

## 2019-09-12 LAB — BRAIN NATRIURETIC PEPTIDE: B Natriuretic Peptide: 715.6 pg/mL — ABNORMAL HIGH (ref 0.0–100.0)

## 2019-09-12 MED ORDER — SODIUM CHLORIDE 0.9% FLUSH
10.0000 mL | INTRAVENOUS | Status: DC | PRN
  Administered 2019-09-13: 10 mL
  Filled 2019-09-12: qty 40

## 2019-09-12 MED ORDER — SODIUM CHLORIDE 0.9% FLUSH
10.0000 mL | Freq: Two times a day (BID) | INTRAVENOUS | Status: DC
  Administered 2019-09-12 – 2019-09-13 (×2): 10 mL

## 2019-09-12 MED ORDER — CHLORHEXIDINE GLUCONATE CLOTH 2 % EX PADS
6.0000 | MEDICATED_PAD | Freq: Every day | CUTANEOUS | Status: DC
  Administered 2019-09-12: 22:00:00 6 via TOPICAL

## 2019-09-12 NOTE — H&P (View-Only) (Signed)
Subjective:  Feels much better today. Leg edema is better but UOP decreasing this morning Intake/Output from previous day:  I/O last 3 completed shifts: In: 978.1 [P.O.:942; I.V.:36.1] Out: 2150 [Urine:2150] Total I/O In: 360 [P.O.:360] Out: 200 [Urine:200]  Blood pressure 112/68, pulse 93, temperature 98.2 F (36.8 C), temperature source Oral, resp. rate (!) 22, weight 101.6 kg, SpO2 97 %. Physical Exam  Constitutional:  Moderately built and morbidly obese appears to be in mild respiratory distress.  Appears ill and older than stated age.  HENT:  Head: Atraumatic.  Eyes: Conjunctivae are normal.  Neck: Neck supple. No thyromegaly present.  Cardiovascular: Normal rate, regular rhythm and intact distal pulses. Exam reveals gallop and S4.  No murmur heard. Pulses:      Carotid pulses are 2+ on the right side and 2+ on the left side.      Femoral pulses are 1+ on the right side and 1+ on the left side.      Popliteal pulses are 0 on the right side and 0 on the left side.       Dorsalis pedis pulses are 0 on the right side and 0 on the left side.       Posterior tibial pulses are 0 on the right side and 0 on the left side.  3+ bilateral leg edema, pitting.  JVD elevated to the angle of the jaw.  Pulmonary/Chest: Effort normal and breath sounds normal.  Abdominal: Soft. Bowel sounds are normal.  Musculoskeletal: Normal range of motion.  Neurological: She is alert.  Skin: Skin is warm and dry.  Psychiatric: She has a normal mood and affect.    Lab Results: BMP BNP (last 3 results) Recent Labs    09/10/19 1716 09/11/19 0550 09/12/19 0623  BNP 781.7* 843.8* 715.6*    ProBNP (last 3 results) No results for input(s): PROBNP in the last 8760 hours. BMP Latest Ref Rng & Units 09/12/2019 09/11/2019 09/10/2019  Glucose 70 - 99 mg/dL 91 98 114(H)  BUN 8 - 23 mg/dL 41(H) 37(H) 36(H)  Creatinine 0.44 - 1.00 mg/dL 1.56(H) 1.35(H) 1.43(H)  BUN/Creat Ratio 12 - 28 - - -  Sodium 135 -  145 mmol/L 140 138 137  Potassium 3.5 - 5.1 mmol/L 4.7 4.6 4.2  Chloride 98 - 111 mmol/L 109 108 108  CO2 22 - 32 mmol/L 22 22 22  Calcium 8.9 - 10.3 mg/dL 8.1(L) 8.3(L) 8.3(L)   Hepatic Function Latest Ref Rng & Units 09/10/2019 08/17/2019 07/20/2019  Total Protein 6.5 - 8.1 g/dL 6.7 - -  Albumin 3.5 - 5.0 g/dL 3.1(L) 3.2(L) 3.3(L)  AST 15 - 41 U/L 12(L) - -  ALT 0 - 44 U/L 9 - -  Alk Phosphatase 38 - 126 U/L 49 - -  Total Bilirubin 0.3 - 1.2 mg/dL 1.1 - -   CBC Latest Ref Rng & Units 09/10/2019 08/31/2019 08/17/2019  WBC 4.0 - 10.5 K/uL 3.5(L) - -  Hemoglobin 12.0 - 15.0 g/dL 9.6(L) 10.5(L) 9.9(L)  Hematocrit 36.0 - 46.0 % 28.8(L) - -  Platelets 150 - 400 K/uL 137(L) - -   Lipid Panel     Component Value Date/Time   CHOL 157 07/20/2018 1452   TRIG 98 07/20/2018 1452   HDL 42 07/20/2018 1452   CHOLHDL 3.7 07/20/2018 1452   CHOLHDL 4.3 12/10/2013 0856   VLDL 18 12/10/2013 0856   LDLCALC 95 07/20/2018 1452   Cardiac Panel (last 3 results) No results for input(s): CKTOTAL, CKMB, TROPONINI, RELINDX   in the last 72 hours.  HEMOGLOBIN A1C No results found for: HGBA1C, MPG TSH No results for input(s): TSH in the last 8760 hours. Imaging: :Imaging results have been reviewed  Cardiac Studies:  Lexiscan Sestamibi Stress Test 09/06/2019: 1. Stress EKG is non-diagnostic, as this is pharmacological stress test. 2. The LV is dilated in both rest and stress images with LV EDV of 156 mL. There is very small decrease in uptake in the anterior and apical lateral wall suggestive of very small sized ischemia. Stress LV EF is severely dysfunctional 30% with global hypokinesis.  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 However, balanced ischemia and multivessel CAD cannot be excluded.  3. In addition, the right ventricle appears to be severely dilated.  4. High risk study. No previous exam available for comparison.  Echo  07/12/2019:  1. The left ventricle has low normal systolic function, with an ejection fraction of 50-55%. The cavity size was mildly dilated. Left ventricular diastolic Doppler parameters are consistent with impaired relaxation. There is right ventricular volume and  pressure overload.  2. The right ventricle has moderately reduced systolic function. The cavity was mildly enlarged. There is no increase in right ventricular wall thickness.  3. Left atrial size was moderately dilated.  4. Right atrial size was moderately dilated.  5. Mild thickening of the mitral valve leaflet. Mitral valve regurgitation is moderate to severe by color flow Doppler.  6. The MV is moderately thickened. There is at least moderate to severe eccentric MR associated with pulmonary HTN and RV pressure/volume overlaod. RVSP 65-70mmHG. Would suggest TEE to further evaluate.  7. The aortic valve is tricuspid. Mild calcification of the aortic valve.  8. Pulmonic valve regurgitation is moderate by color flow Doppler.  9. The aorta is normal in size and structure. 10. The inferior vena cava was dilated in size with >50% respiratory variability.  Scheduled Meds: . aspirin EC  81 mg Oral Daily  . enoxaparin (LOVENOX) injection  40 mg Subcutaneous Q24H  . furosemide  40 mg Intravenous TID  . influenza vac split quadrivalent PF  0.5 mL Intramuscular Tomorrow-1000  . isosorbide mononitrate  120 mg Oral Daily  . metoprolol succinate  25 mg Oral Daily  . nicotine  21 mg Transdermal Daily  . potassium chloride  10 mEq Oral BID   Continuous Infusions: . DOBUTamine 2.5 mcg/kg/min (09/11/19 1758)   PRN Meds:.acetaminophen, albuterol, ondansetron (ZOFRAN) IV  Assessment/Plan:  1. 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  3.  Acute on chronic LV diastolic heart failure. 3.  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 4.  Primary hypertension 5.  COPD and tobacco use disorder   Recommendation: Much better today, renal function is fairly stable. COntinue IV diuretics and increase dobut to 5 mcg. F/U S. Cr in AM. Possible left and right heart cath on Tuesday if stable Sady Monaco, M.D. 09/12/2019, 1:00 PM [facility] Pager: [phone] Office: [phone] If no answer: [phone]  

## 2019-09-12 NOTE — Progress Notes (Signed)
Peripherally Inserted Central Catheter/Midline Placement  The IV Nurse has discussed with the patient and/or persons authorized to consent for the patient, the purpose of this procedure and the potential benefits and risks involved with this procedure.  The benefits include less needle sticks, lab draws from the catheter, and the patient may be discharged home with the catheter. Risks include, but not limited to, infection, bleeding, blood clot (thrombus formation), and puncture of an artery; nerve damage and irregular heartbeat and possibility to perform a PICC exchange if needed/ordered by physician.  Alternatives to this procedure were also discussed.  B[employer] PICC patient education guide, fact sheet on infection prevention and patient information card has been provided to patient /or left at bedside.    PICC/Midline Placement Documentation  PICC Double Lumen 09/12/19 PICC Right Brachial 37 cm 0 cm (Active)  Indication for Insertion or Continuance of Line Vasoactive infusions 09/12/19 1901  Exposed Catheter (cm) 0 cm 09/12/19 1901  Site Assessment Clean;Dry;Intact 09/12/19 1901  Lumen #1 Status Flushed;Saline locked;Blood return noted 09/12/19 1901  Lumen #2 Status Flushed;Saline locked;Blood return noted 09/12/19 1901  Dressing Type Transparent 09/12/19 1901  Dressing Status Clean;Dry;Antimicrobial disc in place;Intact 09/12/19 1901  Dressing Change Due 09/19/19 09/12/19 1901       Gordan Payment 09/12/2019, 7:02 PM

## 2019-09-12 NOTE — Progress Notes (Signed)
Iv dobutamine increase  To 52mcg/kg/min changed IV site to 20G in left arm Iv Picc line ordered.

## 2019-09-12 NOTE — Progress Notes (Signed)
Subjective:  Feels much better today. Leg edema is better but UOP decreasing this morning Intake/Output from previous day:  I/O last 3 completed shifts: In: 978.1 [P.O.:942; I.V.:36.1] Out: 2150 [Urine:2150] Total I/O In: 360 [P.O.:360] Out: 200 [Urine:200]  Blood pressure 112/68, pulse 93, temperature 98.2 F (36.8 C), temperature source Oral, resp. rate (!) 22, weight 101.6 kg, SpO2 97 %. Physical Exam  Constitutional:  Moderately built and morbidly obese appears to be in mild respiratory distress.  Appears ill and older than stated age.  HENT:  Head: Atraumatic.  Eyes: Conjunctivae are normal.  Neck: Neck supple. No thyromegaly present.  Cardiovascular: Normal rate, regular rhythm and intact distal pulses. Exam reveals gallop and S4.  No murmur heard. Pulses:      Carotid pulses are 2+ on the right side and 2+ on the left side.      Femoral pulses are 1+ on the right side and 1+ on the left side.      Popliteal pulses are 0 on the right side and 0 on the left side.       Dorsalis pedis pulses are 0 on the right side and 0 on the left side.       Posterior tibial pulses are 0 on the right side and 0 on the left side.  3+ bilateral leg edema, pitting.  JVD elevated to the angle of the jaw.  Pulmonary/Chest: Effort normal and breath sounds normal.  Abdominal: Soft. Bowel sounds are normal.  Musculoskeletal: Normal range of motion.  Neurological: She is alert.  Skin: Skin is warm and dry.  Psychiatric: She has a normal mood and affect.    Lab Results: BMP BNP (last 3 results) Recent Labs    09/10/19 1716 09/11/19 0550 09/12/19 0623  BNP 781.7* 843.8* 715.6*    ProBNP (last 3 results) No results for input(s): PROBNP in the last 8760 hours. BMP Latest Ref Rng & Units 09/12/2019 09/11/2019 09/10/2019  Glucose 70 - 99 mg/dL 91 98 114(H)  BUN 8 - 23 mg/dL 41(H) 37(H) 36(H)  Creatinine 0.44 - 1.00 mg/dL 1.56(H) 1.35(H) 1.43(H)  BUN/Creat Ratio 12 - 28 - - -  Sodium 135 -  145 mmol/L 140 138 137  Potassium 3.5 - 5.1 mmol/L 4.7 4.6 4.2  Chloride 98 - 111 mmol/L 109 108 108  CO2 22 - 32 mmol/L _0 Calcium 8.9 - 10.3 mg/dL 8.1(L) 8.3(L) 8.3(L)   Hepatic Function Latest Ref Rng & Units 09/10/2019 08/17/2019 07/20/2019  Total Protein 6.5 - 8.1 g/dL 6.7 - -  Albumin 3.5 - 5.0 g/dL 3.1(L) 3.2(L) 3.3(L)  AST 15 - 41 U/L 12(L) - -  ALT 0 - 44 U/L 9 - -  Alk Phosphatase 38 - 126 U/L 49 - -  Total Bilirubin 0.3 - 1.2 mg/dL 1.1 - -   CBC Latest Ref Rng & Units 09/10/2019 08/31/2019 08/17/2019  WBC 4.0 - 10.5 K/uL 3.5(L) - -  Hemoglobin 12.0 - 15.0 g/dL 9.6(L) 10.5(L) 9.9(L)  Hematocrit 36.0 - 46.0 % 28.8(L) - -  Platelets 150 - 400 K/uL 137(L) - -   Lipid Panel     Component Value Date/Time   CHOL 157 07/20/2018 1452   TRIG 98 07/20/2018 1452   HDL 42 07/20/2018 1452   CHOLHDL 3.7 07/20/2018 1452   CHOLHDL 4.3 12/10/2013 0856   VLDL 18 12/10/2013 0856   LDLCALC 95 07/20/2018 1452   Cardiac Panel (last 3 results) No results for input(s): CKTOTAL, CKMB, TROPONINI, RELINDX  in the last 72 hours.  HEMOGLOBIN A1C No results found for: HGBA1C, MPG TSH No results for input(s): TSH in the last 8760 hours. Imaging: :Imaging results have been reviewed  Cardiac Studies:  Lexiscan Sestamibi Stress Test 09/06/2019: 1. Stress EKG is non-diagnostic, as this is pharmacological stress test. 2. The LV is dilated in both rest and stress images with LV EDV of 156 mL. There is very small decrease in uptake in the anterior and apical lateral wall suggestive of very small sized ischemia. Stress LV EF is severely dysfunctional 30% with global hypokinesis.  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 However, balanced ischemia and multivessel CAD cannot be excluded.  3. In addition, the right ventricle appears to be severely dilated.  4. High risk study. No previous exam available for comparison.  Echo  07/12/2019:  1. The left ventricle has low normal systolic function, with an ejection fraction of 50-55%. The cavity size was mildly dilated. Left ventricular diastolic Doppler parameters are consistent with impaired relaxation. There is right ventricular volume and  pressure overload.  2. The right ventricle has moderately reduced systolic function. The cavity was mildly enlarged. There is no increase in right ventricular wall thickness.  3. Left atrial size was moderately dilated.  4. Right atrial size was moderately dilated.  5. Mild thickening of the mitral valve leaflet. Mitral valve regurgitation is moderate to severe by color flow Doppler.  6. The MV is moderately thickened. There is at least moderate to severe eccentric MR associated with pulmonary HTN and RV pressure/volume overlaod. RVSP 65-16mHG. Would suggest TEE to further evaluate.  7. The aortic valve is tricuspid. Mild calcification of the aortic valve.  8. Pulmonic valve regurgitation is moderate by color flow Doppler.  9. The aorta is normal in size and structure. 10. The inferior vena cava was dilated in size with >50% respiratory variability.  Scheduled Meds: . aspirin EC  81 mg Oral Daily  . enoxaparin (LOVENOX) injection  40 mg Subcutaneous Q24H  . furosemide  40 mg Intravenous TID  . influenza vac split quadrivalent PF  0.5 mL Intramuscular Tomorrow-1000  . isosorbide mononitrate  120 mg Oral Daily  . metoprolol succinate  25 mg Oral Daily  . nicotine  21 mg Transdermal Daily  . potassium chloride  10 mEq Oral BID   Continuous Infusions: . DOBUTamine 2.5 mcg/kg/min (09/11/19 1758)   PRN Meds:.acetaminophen, albuterol, ondansetron (ZOFRAN) IV  Assessment/Plan:  1. 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  3.  Acute on chronic LV diastolic heart failure. 3.  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 4.  Primary hypertension 5.  COPD and tobacco use disorder   Recommendation: Much better today, renal function is fairly stable. COntinue IV diuretics and increase dobut to 5 mcg. F/U S. Cr in AM. Possible left and right heart cath on Tuesday if stable JAdrian Prows M.D. 09/12/2019, 1:00 PM [facility] Pager: [phone] Office: [phone] no answer: [phone]

## 2019-09-13 ENCOUNTER — Other Ambulatory Visit (HOSPITAL_COMMUNITY): Payer: Self-pay | Admitting: *Deleted

## 2019-09-13 ENCOUNTER — Encounter (HOSPITAL_COMMUNITY): Payer: Self-pay | Admitting: Cardiology

## 2019-09-13 ENCOUNTER — Inpatient Hospital Stay (HOSPITAL_COMMUNITY): Payer: No Typology Code available for payment source

## 2019-09-13 DIAGNOSIS — I272 Pulmonary hypertension, unspecified: Secondary | ICD-10-CM

## 2019-09-13 DIAGNOSIS — J9621 Acute and chronic respiratory failure with hypoxia: Secondary | ICD-10-CM

## 2019-09-13 DIAGNOSIS — I2609 Other pulmonary embolism with acute cor pulmonale: Secondary | ICD-10-CM

## 2019-09-13 LAB — BASIC METABOLIC PANEL
Anion gap: 7 (ref 5–15)
BUN: 42 mg/dL — ABNORMAL HIGH (ref 8–23)
CO2: 24 mmol/L (ref 22–32)
Calcium: 8.2 mg/dL — ABNORMAL LOW (ref 8.9–10.3)
Chloride: 112 mmol/L — ABNORMAL HIGH (ref 98–111)
Creatinine, Ser: 1.52 mg/dL — ABNORMAL HIGH (ref 0.44–1.00)
GFR calc Af Amer: 42 mL/min — ABNORMAL LOW (ref >60)
GFR calc non Af Amer: 36 mL/min — ABNORMAL LOW (ref >60)
Glucose, Bld: 98 mg/dL (ref 70–99)
Potassium: 4.8 mmol/L (ref 3.5–5.1)
Sodium: 143 mmol/L (ref 135–145)

## 2019-09-13 LAB — BLOOD GAS, ARTERIAL
Acid-base deficit: 0.4 mmol/L (ref 0.0–2.0)
Bicarbonate: 23.9 mmol/L (ref 20.0–28.0)
Drawn by: 548871
O2 Saturation: 91.7 %
Patient temperature: 98.6
pCO2 arterial: 40.9 mmHg (ref 32.0–48.0)
pH, Arterial: 7.385 (ref 7.350–7.450)
pO2, Arterial: 63.1 mmHg — ABNORMAL LOW (ref 83.0–108.0)

## 2019-09-13 LAB — BRAIN NATRIURETIC PEPTIDE: B Natriuretic Peptide: 636 pg/mL — ABNORMAL HIGH (ref 0.0–100.0)

## 2019-09-13 MED ORDER — ASPIRIN 81 MG PO CHEW
81.0000 mg | CHEWABLE_TABLET | ORAL | Status: AC
  Administered 2019-09-14: 81 mg via ORAL
  Filled 2019-09-13: qty 1

## 2019-09-13 MED ORDER — SODIUM CHLORIDE 0.9% FLUSH: 3.0000 mL | INTRAVENOUS | Status: DC | PRN

## 2019-09-13 MED ORDER — CHLORHEXIDINE GLUCONATE CLOTH 2 % EX PADS
6.0000 | MEDICATED_PAD | Freq: Every day | CUTANEOUS | Status: DC
  Administered 2019-09-14 – 2019-09-16 (×3): 6 via TOPICAL

## 2019-09-13 MED ORDER — SODIUM CHLORIDE 0.9 % IV SOLN: 250.0000 mL | INTRAVENOUS | Status: DC | PRN

## 2019-09-13 MED ORDER — IPRATROPIUM-ALBUTEROL 0.5-2.5 (3) MG/3ML IN SOLN
3.0000 mL | Freq: Four times a day (QID) | RESPIRATORY_TRACT | Status: DC
  Administered 2019-09-13: 3 mL via RESPIRATORY_TRACT
  Filled 2019-09-13: qty 3

## 2019-09-13 MED ORDER — BUDESONIDE 0.25 MG/2ML IN SUSP
0.2500 mg | Freq: Two times a day (BID) | RESPIRATORY_TRACT | Status: DC
  Administered 2019-09-13 – 2019-09-16 (×5): 0.25 mg via RESPIRATORY_TRACT
  Filled 2019-09-13 (×5): qty 2

## 2019-09-13 MED ORDER — SODIUM CHLORIDE 0.9% FLUSH
3.0000 mL | Freq: Two times a day (BID) | INTRAVENOUS | Status: DC
  Administered 2019-09-13 – 2019-09-15 (×5): 3 mL via INTRAVENOUS

## 2019-09-13 MED ORDER — SODIUM CHLORIDE 0.9 % IV SOLN
INTRAVENOUS | Status: DC
  Administered 2019-09-14: 07:00:00 via INTRAVENOUS

## 2019-09-13 NOTE — Progress Notes (Signed)
Subjective:  Leg swelling better, but still persists. Still requiring oxygen.  Objective:  Vital Signs in the last 24 hours: Temp:  [97.8 F (36.6 C)-98.2 F (36.8 C)] 97.8 F (36.6 C) (09/21 0541) Pulse Rate:  [76-93] 76 (09/21 0541) Resp:  [18-22] 18 (09/21 0541) BP: (112-166)/(60-68) 166/62 (09/21 0541) SpO2:  [94 %-99 %] 94 % (09/21 0541) Weight:  [100.5 kg] 100.5 kg (09/21 0536)  Intake/Output from previous day: 09/20 0701 - 09/21 0700 In: 515.8 [P.O.:480; I.V.:35.8] Out: 1400 [Urine:1400]  Physical Exam Constitutional: Sitting up in a tripod position with mild respiratory distress. HENT:  Head: Atraumatic.  Eyes: Conjunctivae are normal.  Neck: Neck supple. No thyromegaly present.  Cardiovascular: Normal rate, regular rhythm and intact distal pulses. Exam reveals gallop and S4.  No murmur heard. Pulses:      Carotid pulses are 2+ on the right side and 2+ on the left side.      Femoral pulses are 1+ on the right side and 1+ on the left side.      Popliteal pulses are 0 on the right side and 0 on the left side.       Dorsalis pedis pulses are 0 on the right side and 0 on the left side.       Posterior tibial pulses are 0 on the right side and 0 on the left side.  3+ bilateral leg edema, pitting.  JVD elevated to the angle of the jaw.  Pulmonary/Chest:  Diminished breath sounds bilateral bases.  No obvious wheezing. Abdominal: Soft. Bowel sounds are normal.  Musculoskeletal: Normal range of motion.  Neurological: She is alert.  Skin: Skin is warm and dry.  Psychiatric: She has a normal mood and affect.   Lab Results: BMP Recent Labs    09/11/19 0550 09/12/19 0623 09/13/19 0629  NA 138 140 143  K 4.6 4.7 4.8  CL 108 109 112*  CO2 22 22 24   GLUCOSE 98 91 98  BUN 37* 41* 42*  CREATININE 1.35* 1.56* 1.52*  CALCIUM 8.3* 8.1* 8.2*  GFRNONAA 41* 35* 36*  GFRAA 48* 40* 42*    CBC Recent Labs  Lab 09/10/19 1706  WBC 3.5*  RBC 2.60*  HGB 9.6*  HCT 28.8*   PLT 137*  MCV 110.8*  MCH 36.9*  MCHC 33.3  RDW 19.3*  LYMPHSABS 0.7  MONOABS 0.1  EOSABS 0.0  BASOSABS 0.0    BNP (last 3 results) Recent Labs    09/11/19 0550 09/12/19 0623 09/13/19 0629  BNP 843.8* 715.6* 636.0*    Lipid Panel     Component Value Date/Time   CHOL 157 07/20/2018 1452   TRIG 98 07/20/2018 1452   HDL 42 07/20/2018 1452   CHOLHDL 3.7 07/20/2018 1452   CHOLHDL 4.3 12/10/2013 0856   VLDL 18 12/10/2013 0856   LDLCALC 95 07/20/2018 1452     Hepatic Function Panel Recent Labs    07/20/19 1229 08/17/19 1318 09/10/19 1706  PROT  --   --  6.7  ALBUMIN 3.3* 3.2* 3.1*  AST  --   --  12*  ALT  --   --  9  ALKPHOS  --   --  49  BILITOT  --   --  1.1     Cardiac Studies:  EKG 09/11/2019: Sinus rhythm with frequent Premature ventricular complexes Nonspecific ST and T wave abnormality Prolonged QT Abnormal ECG  Lexiscan Sestamibi Stress Test 09/06/2019: 1. Stress EKG is non-diagnostic, as this is pharmacological stress  test. 2. The LV is dilated in both rest and stress images with LV EDV of 156 mL. There is very small decrease in uptake in the anterior and apical lateral wall suggestive of very small sized ischemia. Stress LV EF is severely dysfunctional 30% with global hypokinesis.  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 However, balanced ischemia and multivessel CAD cannot be excluded.  3. In addition, the right ventricle appears to be severely dilated.  4. High risk study. No previous exam available for comparison.  Echo 07/12/2019:  1. The left ventricle has low normal systolic function, with an ejection fraction of 50-55%. The cavity size was mildly dilated. Left ventricular diastolic Doppler parameters are consistent with impaired relaxation. There is right ventricular volume and  pressure overload.  2. The right ventricle has moderately reduced systolic function. The  cavity was mildly enlarged. There is no increase in right ventricular wall thickness.  3. Left atrial size was moderately dilated.  4. Right atrial size was moderately dilated.  5. Mild thickening of the mitral valve leaflet. Mitral valve regurgitation is moderate to severe by color flow Doppler.  6. The MV is moderately thickened. There is at least moderate to severe eccentric MR associated with pulmonary HTN and RV pressure/volume overlaod. RVSP 65-51mmHG. Would suggest TEE to further evaluate.  7. The aortic valve is tricuspid. Mild calcification of the aortic valve.  8. Pulmonic valve regurgitation is moderate by color flow Doppler.  9. The aorta is normal in size and structure. 10. The inferior vena cava was dilated in size with >50% respiratory variability.  Assessment & Recommendations:  63 y/o [place] American female with hypertension, COPD, tobacco abuse, admitted with acute on chronic cor pulmonale, severe PH  Acute on chronic cor pulmonale: Net negative 2 L with IV diuresis. Still has features of right sided failure. Continue IV diuresis with lasix 40 mg tid.  Will wean off dobutamine today.  With her recent abnormal stress test and pulmonary hypertension, will perform right and left heart cath tomorrow 9/22. Continue Aspirin  I suspect patient has advanced age COPD.  She is never seen a pulmonologist and has never had a lung function test.  I will request pulmonology consult to start her on treatment for COPD, and continue outpatient work-up thereafter.  Pulmonary hypertension: PASP 65-70 mmHg: While COPD certainly a factor, suspect she may have additional WHO grps contributing to her severity of pulmonary hypertension.  Hypertension: Controlled on metoprolol succinate 25 mg daily, Imdur 120 mg daily.  Tobacco abuse: Continue nicotine patch  Nigel Mormon, M.D. 09/13/2019, 7:56 AM [facility] Pager: [phone] Office: [phone] If no  answer: [phone]

## 2019-09-13 NOTE — TOC Initial Note (Signed)
Transition of Care [facility]) - Initial/Assessment Note    Patient Details  Name: Vanessa Williamson MRN: [id number] Date of Birth: Feb 27, 1956  Transition of Care [facility]) CM/SW Contact:    Alberteen Sam, [place] Phone Number: [phone] 09/13/2019, 12:37 PM  Clinical Narrative:                  CSW met with patient to review discharge planning needs and any home health needs to monitor CHF symptoms Patient agreeable to [facility] RN to be set up to ensure management of symptoms once patient is discharge home. Patient has no preference for home health agency at this time.   CSW currently sending referrals to [place] for [facility] RN, pending acceptance at this time.   Expected Discharge Plan: [place] Barriers to Discharge: Continued Medical Work up   Patient Goals and CMS Choice Patient states their goals for this hospitalization and ongoing recovery are:: to go home CMS Medicare.gov Compare Post Acute Care list provided to:: Patient Choice offered to / list presented to : Patient  Expected Discharge Plan and Services Expected Discharge Plan: [place]       Living arrangements for the past 2 months: Single Family Home                           HH Arranged: RN          Prior Living Arrangements/Services Living arrangements for the past 2 months: Single Family Home Lives with:: Self Patient language and need for interpreter reviewed:: Yes Do you feel safe going back to the place where you live?: Yes      Need for Family Participation in Patient Care: No (Comment) Care giver support system in place?: No (comment)   Criminal Activity/Legal Involvement Pertinent to Current Situation/Hospitalization: No - Comment as needed  Activities of Daily Living Home Assistive Devices/Equipment: None ADL Screening (condition at time of admission) Patient's cognitive ability adequate to safely complete daily activities?: Yes Is the patient deaf or  have difficulty hearing?: No Does the patient have difficulty seeing, even when wearing glasses/contacts?: No Does the patient have difficulty concentrating, remembering, or making decisions?: No Patient able to express need for assistance with ADLs?: Yes Does the patient have difficulty dressing or bathing?: No Independently performs ADLs?: Yes (appropriate for developmental age) Does the patient have difficulty walking or climbing stairs?: Yes Weakness of Legs: None Weakness of Arms/Hands: None  Permission Sought/Granted Permission sought to share information with : Case Manager, Customer service manager, Family Supports Permission granted to share information with : Yes, Verbal Permission Granted     Permission granted to share info w AGENCY: Home Health        Emotional Assessment Appearance:: Appears stated age Attitude/Demeanor/Rapport: Gracious Affect (typically observed): Calm Orientation: : Oriented to Self, Oriented to Place, Oriented to  Time, Oriented to Situation Alcohol / Substance Use: Not Applicable Psych Involvement: No (comment)  Admission diagnosis:  heart failure Patient Active Problem List   Diagnosis Date Noted  . (HFpEF) heart failure with preserved ejection fraction ([place]) 09/10/2019  . Anemia in chronic kidney disease (CKD) 07/20/2019  . Incarcerated ventral hernia 10/16/2017  . Ventral hernia 02/03/2014  . Unspecified vitamin D deficiency 12/11/2013  . Morbid obesity with BMI of 40.0-44.9, adult ([place]) 12/10/2013  . Tobacco use disorder 12/10/2013  . COPD (chronic obstructive pulmonary disease) ([place]) 08/19/2012  . HTN (hypertension) 03/08/2012  .  GERD (gastroesophageal reflux disease) 03/08/2012  . Allergic rhinitis 03/08/2012   PCP:  Patient, No Pcp Per Pharmacy:   [id number]: [phone]: [phone] [facility] [place]Phone: [phone]: [phone]    Social Determinants of Health ([place] Interventions    Readmission Risk Interventions No flowsheet data found.

## 2019-09-13 NOTE — Consult Note (Addendum)
NAME:  Vanessa Williamson, MRN:  [id number], DOB:  1956-02-16, LOS: 3 ADMISSION DATE:  09/10/2019, CONSULTATION DATE:  09/13/2019  REFERRING MD:  Dr Doug Sou , CHIEF COMPLAINT:  Chronic resp failure with acute - rule out pulmonary pathology   Brief History   See hpi -history is gained from talking to the patient and review of cardiology notes.  History of present illness   63 year old obese african [place] female.  With obesity BMI 39, hypertension, heavy tobacco use, chronic stage III kidney disease patient had an echocardiogram July 20, 2019 that showed normal LV function 55% with grade 1 diastolic dysfunction but severe RV strain and severe pulmonary hypertension associated with severe eccentric mitral regurgitation.  She presented to [facility] cardiovascular on July 10, 2019 with worsening dyspnea of insidious onset.  She did have a cardiac stress test on September 06, 2019 which showed severe LV dysfunction with 30% ejection fraction and global hypokinesis on the stress portion.  She tells me that a year ago she could do gardening but more recently she is been unable to even get from one room to the other in a small house.  The worsening has been over the last 6 or 8 months.  In the cardiologist office she was noted to be desaturating to 85% while walking in the hallway.  Symptoms are associated with orthopnea and significant worsening of pedal edema.  No chest pain or palpitations.  Based on this she got admitted to the hospital for acute on chronic decompensated heart failure  Since admission she has been subject to diuresis with improvement in leg swelling but still requiring oxygen.  She is not sure if class IV dyspnea is better but she thinks it is better.  She is between 2 and 3 L of oxygen according to the nurse and saturating well.  As of September 13, 2019 she is 2 L negative since admission.  She has been weaned off dobutamine.  There are plans to do a right heart and left heart  catheterization on September 14, 2019 but given her smoking history it was felt that underlying pulmonary pathology particularly COPD needed to be ruled out and pulmonary has been consulted.   Noted: The hypoxemia is new this admission. She denies any collagen vascular disease or previous diagnosis of COPD.  Her smoking history is pretty heavy 2 packs a day since age of 89 and more recently half pack a day.  Review of systems: 11 point review of systems is negative other than stated in the history of present illness.  Past Medical History     has a past medical history of Anemia, Cancer ([place]), Chronic kidney disease, COPD (chronic obstructive pulmonary disease) ([place]), GERD (gastroesophageal reflux disease), and Hypertension.   reports that she has been smoking cigarettes. She has been smoking about 0.25 packs per day. She has never used smokeless tobacco.  Past Surgical History:  Procedure Laterality Date   BREAST BIOPSY Left    benign   CERVIX LESION DESTRUCTION     frozen   COLONOSCOPY  03/09/2008   EYE SURGERY     laser   FRACTURE SURGERY     HUMERUS FRACTURE SURGERY     left  2005   VENTRAL HERNIA REPAIR N/A 10/16/2017   Procedure: OPEN VENTRAL HERNIA  REPAIR ERAS PATHWAY;  Surgeon: Alphonsa Overall, MD;  Location: [facility];  Service: General;  Laterality: N/A;    No Known Allergies  Immunization History  Administered Date(s) Administered  Influenza,inj,Quad PF,6+ Mos 09/14/2013, 08/28/2017, 09/12/2019   Pneumococcal Polysaccharide-23 10/17/2017   Tdap 12/24/2007    Family History  Problem Relation Age of Onset   Hypertension Mother    Heart disease Mother    Hyperlipidemia Mother    Heart disease Father    Hypertension Father    Heart disease Maternal Grandmother    Heart disease Maternal Grandfather    Cancer Sister    Stroke Brother    Hypertension Brother    Colon cancer Neg Hx    Colon polyps Neg Hx    Esophageal cancer Neg Hx     Stomach cancer Neg Hx    Rectal cancer Neg Hx      Current Facility-Administered Medications:    acetaminophen (TYLENOL) tablet 650 mg, 650 mg, Oral, Q4H PRN, Miquel Dunn, NP, 650 mg at 09/13/19 0244   albuterol (PROVENTIL) (2.5 MG/3ML) 0.083% nebulizer solution 2.5 mg, 2.5 mg, Nebulization, TID PRN, Adrian Prows, MD, 2.5 mg at 09/11/19 1236   aspirin EC tablet 81 mg, 81 mg, Oral, Daily, Miquel Dunn, NP, 81 mg at 09/13/19 0930   [START ON 09/14/2019] Chlorhexidine Gluconate Cloth 2 % PADS 6 each, 6 each, Topical, Q0600, Adrian Prows, MD   DOBUTamine (DOBUTREX) infusion 4000 mcg/mL, 2.5 mcg/kg/min, Intravenous, Titrated, Patwardhan, Manish J, MD, Last Rate: 3.81 mL/hr at 09/13/19 0931, 2.5 mcg/kg/min at 09/13/19 0931   enoxaparin (LOVENOX) injection 40 mg, 40 mg, Subcutaneous, Q24H, Miquel Dunn, NP, 40 mg at 09/12/19 1510   furosemide (LASIX) injection 40 mg, 40 mg, Intravenous, TID, Einar Grad, RPH, 40 mg at 09/13/19 0930   isosorbide mononitrate (IMDUR) 24 hr tablet 120 mg, 120 mg, Oral, Daily, Adrian Prows, MD, 120 mg at 09/13/19 0930   metoprolol succinate (TOPROL-XL) 24 hr tablet 25 mg, 25 mg, Oral, Daily, Adrian Prows, MD, 25 mg at 09/13/19 0930   nicotine (NICODERM CQ - dosed in mg/24 hours) patch 21 mg, 21 mg, Transdermal, Daily, Adrian Prows, MD, 21 mg at 09/13/19 0930   ondansetron (ZOFRAN) injection 4 mg, 4 mg, Intravenous, Q6H PRN, Miquel Dunn, NP   potassium chloride (K-DUR) CR tablet 10 mEq, 10 mEq, Oral, BID, Adrian Prows, MD, 10 mEq at 09/13/19 0930   sodium chloride flush (NS) 0.9 % injection 10-40 mL, 10-40 mL, Intracatheter, Q12H, Adrian Prows, MD, 10 mL at 09/13/19 0936   sodium chloride flush (NS) 0.9 % injection 10-40 mL, 10-40 mL, Intracatheter, PRN, Adrian Prows, MD    Objective   Blood pressure (!) 166/62, pulse 76, temperature 97.8 F (36.6 C), temperature source Oral, resp. rate 18, weight 100.5 kg, SpO2 94 %.         Intake/Output Summary (Last 24 hours) at 09/13/2019 1058 Last data filed at 09/13/2019 0752 Gross per 24 hour  Intake 275.83 ml  Output 1400 ml  Net -1124.17 ml   Filed Weights   09/11/19 0331 09/12/19 0333 09/13/19 0536  Weight: 101.5 kg 101.6 kg 100.5 kg    Examination: General: Morbidly obese female sitting in 45 degrees in the bed and watching television HENT: Oxygen on.  Lips appear cyanotic.  No distress Lungs: Bilateral crackles fine crackles present.  Only mild respiratory distress with tachypnea Cardiovascular: Regular rate and rhythm no murmurs Abdomen: Obese soft nontender Extremities: 3+ edema bilaterally below the knee [she says this is improved] Neuro: Alert and oriented x3.  Speech normal.  Moves all 4 extremities GU: Not examined Musculoskeletal: Grossly intact Skin: Intact on exposed areas.  LABS    PULMONARY No results for input(s): PHART, PCO2ART, PO2ART, HCO3, TCO2, O2SAT in the last 168 hours.  Invalid input(s): PCO2, PO2  CBC Recent Labs  Lab 09/10/19 1706  HGB 9.6*  HCT 28.8*  WBC 3.5*  PLT 137*    COAGULATION No results for input(s): INR in the last 168 hours.  CARDIAC  No results for input(s): TROPONINI in the last 168 hours. No results for input(s): PROBNP in the last 168 hours.   CHEMISTRY Recent Labs  Lab 09/10/19 1706 09/11/19 0550 09/12/19 0623 09/13/19 0629  NA 137 138 140 143  K 4.2 4.6 4.7 4.8  CL 108 108 109 112*  CO2 22 22 22 24   GLUCOSE 114* 98 91 98  BUN 36* 37* 41* 42*  CREATININE 1.43* 1.35* 1.56* 1.52*  CALCIUM 8.3* 8.3* 8.1* 8.2*   Estimated Creatinine Clearance: 43.1 mL/min (A) (by C-G formula based on SCr of 1.52 mg/dL (H)).   LIVER Recent Labs  Lab 09/10/19 1706  AST 12*  ALT 9  ALKPHOS 49  BILITOT 1.1  PROT 6.7  ALBUMIN 3.1*     INFECTIOUS No results for input(s): LATICACIDVEN, PROCALCITON in the last 168 hours.   ENDOCRINE CBG (last 3)  No results for input(s): GLUCAP in the  last 72 hours.       IMAGING x48h  - image(s) personally visualized  -   highlighted in bold [place] Ekg Site Rite  Result Date: 09/12/2019 If Site Rite image not attached, placement could not be confirmed due to current cardiac rhythm.    Resolved Hospital Problem list   xx  Assessment & Plan:  Underlying heavy tobacco use with morbid obesity and metabolic syndrome  Insidious onset dyspnea 8 months prior to admission with progressive worsening to class IV  Acute on chronic hypoxemic respiratory failure in the above setting  Objective data showing severe elevations in pulmonary artery pressures and RV strain and possibly lower left ventricle ejection fraction on stress portion of the test in September 2020   -The above constellation is more consistent with acute on chronic diastolic versus systolic heart failure with cor pulmonale.  Precipitating factors could be undiagnosed COPD or sleep apnea or undiagnosed interstitial lung disease  Plan  -Very difficult to get meaningful CT scan of the chest or pulmonary function test in the presence of significant volume overload.  Nevertheless we will get a high-resolution CT chest supine and prone to get an insight into her parenchymal lung disease  -Can start with a blood gas test to look for hypercapnia and the presence of this commit her to nighttime BiPAP   -Therapy recommendations based on above    Best practice:    -Per the primary cardiology team    SIGNATURE    Dr. Brand Males, M.D., F.C.C.P,  Pulmonary and Critical Care Medicine Staff Physician, [place] Director - Interstitial Lung Disease  Program  Pulmonary [place] [facility] [place], [place], [address]  Pager: [phone], If no answer or between  15:00h - 7:00h: call [phone] Telephone: [phone]  10:59 AM 09/13/2019

## 2019-09-14 ENCOUNTER — Encounter (HOSPITAL_COMMUNITY)
Admission: AD | Disposition: A | Discharge status: Home Health | Payer: Self-pay | Source: Ambulatory Visit | Attending: Cardiology

## 2019-09-14 ENCOUNTER — Inpatient Hospital Stay (HOSPITAL_COMMUNITY)
Admission: RE | Admit: 2019-09-14 | Discharge: 2019-09-14 | Disposition: A | Discharge status: Home / Self Care | Payer: No Typology Code available for payment source | Source: Ambulatory Visit | Attending: Nephrology | Admitting: Nephrology

## 2019-09-14 DIAGNOSIS — R9439 Abnormal result of other cardiovascular function study: Secondary | ICD-10-CM

## 2019-09-14 DIAGNOSIS — R0602 Shortness of breath: Secondary | ICD-10-CM

## 2019-09-14 DIAGNOSIS — R0609 Other forms of dyspnea: Secondary | ICD-10-CM

## 2019-09-14 DIAGNOSIS — I5033 Acute on chronic diastolic (congestive) heart failure: Secondary | ICD-10-CM

## 2019-09-14 DIAGNOSIS — I272 Pulmonary hypertension, unspecified: Secondary | ICD-10-CM

## 2019-09-14 HISTORY — PX: RIGHT/LEFT HEART CATH AND CORONARY ANGIOGRAPHY: CATH118266

## 2019-09-14 LAB — POCT I-STAT EG7
Bicarbonate: 24.8 mmol/L (ref 20.0–28.0)
Calcium, Ion: 1.28 mmol/L (ref 1.15–1.40)
HCT: 21 % — ABNORMAL LOW (ref 36.0–46.0)
Hemoglobin: 7.1 g/dL — ABNORMAL LOW (ref 12.0–15.0)
O2 Saturation: 57 %
Potassium: 4.8 mmol/L (ref 3.5–5.1)
Sodium: 146 mmol/L — ABNORMAL HIGH (ref 135–145)
TCO2: 26 mmol/L (ref 22–32)
pCO2, Ven: 42 mmHg — ABNORMAL LOW (ref 44.0–60.0)
pH, Ven: 7.379 (ref 7.250–7.430)
pO2, Ven: 31 mmHg — CL (ref 32.0–45.0)

## 2019-09-14 LAB — POCT I-STAT 7, (LYTES, BLD GAS, ICA,H+H)
Bicarbonate: 24.1 mmol/L (ref 20.0–28.0)
Calcium, Ion: 1.29 mmol/L (ref 1.15–1.40)
HCT: 21 % — ABNORMAL LOW (ref 36.0–46.0)
Hemoglobin: 7.1 g/dL — ABNORMAL LOW (ref 12.0–15.0)
O2 Saturation: 91 %
Potassium: 4.8 mmol/L (ref 3.5–5.1)
Sodium: 145 mmol/L (ref 135–145)
TCO2: 25 mmol/L (ref 22–32)
pCO2 arterial: 37.7 mmHg (ref 32.0–48.0)
pH, Arterial: 7.414 (ref 7.350–7.450)
pO2, Arterial: 59 mmHg — ABNORMAL LOW (ref 83.0–108.0)

## 2019-09-14 LAB — BASIC METABOLIC PANEL
Anion gap: 6 (ref 5–15)
BUN: 39 mg/dL — ABNORMAL HIGH (ref 8–23)
CO2: 25 mmol/L (ref 22–32)
Calcium: 8.4 mg/dL — ABNORMAL LOW (ref 8.9–10.3)
Chloride: 113 mmol/L — ABNORMAL HIGH (ref 98–111)
Creatinine, Ser: 1.29 mg/dL — ABNORMAL HIGH (ref 0.44–1.00)
GFR calc Af Amer: 51 mL/min — ABNORMAL LOW (ref >60)
GFR calc non Af Amer: 44 mL/min — ABNORMAL LOW (ref >60)
Glucose, Bld: 91 mg/dL (ref 70–99)
Potassium: 4.7 mmol/L (ref 3.5–5.1)
Sodium: 144 mmol/L (ref 135–145)

## 2019-09-14 SURGERY — RIGHT/LEFT HEART CATH AND CORONARY ANGIOGRAPHY
Anesthesia: LOCAL

## 2019-09-14 MED ORDER — FENTANYL CITRATE (PF) 100 MCG/2ML IJ SOLN
INTRAMUSCULAR | Status: AC
  Filled 2019-09-14: qty 2

## 2019-09-14 MED ORDER — HEPARIN SODIUM (PORCINE) 1000 UNIT/ML IJ SOLN
INTRAMUSCULAR | Status: DC | PRN
  Administered 2019-09-14: 4000 [IU] via INTRAVENOUS

## 2019-09-14 MED ORDER — VERAPAMIL HCL 2.5 MG/ML IV SOLN
INTRAVENOUS | Status: DC | PRN
  Administered 2019-09-14: 5 mL via INTRA_ARTERIAL

## 2019-09-14 MED ORDER — IPRATROPIUM-ALBUTEROL 0.5-2.5 (3) MG/3ML IN SOLN
3.0000 mL | Freq: Three times a day (TID) | RESPIRATORY_TRACT | Status: DC
  Administered 2019-09-14 – 2019-09-16 (×5): 3 mL via RESPIRATORY_TRACT
  Filled 2019-09-14 (×6): qty 3

## 2019-09-14 MED ORDER — FENTANYL CITRATE (PF) 100 MCG/2ML IJ SOLN
INTRAMUSCULAR | Status: DC | PRN
  Administered 2019-09-14: 50 ug via INTRAVENOUS

## 2019-09-14 MED ORDER — HEPARIN SODIUM (PORCINE) 1000 UNIT/ML IJ SOLN
INTRAMUSCULAR | Status: AC
  Filled 2019-09-14: qty 1

## 2019-09-14 MED ORDER — VERAPAMIL HCL 2.5 MG/ML IV SOLN
INTRAVENOUS | Status: AC
  Filled 2019-09-14: qty 2

## 2019-09-14 MED ORDER — LIDOCAINE HCL (PF) 1 % IJ SOLN
INTRAMUSCULAR | Status: DC | PRN
  Administered 2019-09-14 (×2): 2 mL

## 2019-09-14 MED ORDER — HEPARIN (PORCINE) IN NACL 1000-0.9 UT/500ML-% IV SOLN
INTRAVENOUS | Status: DC | PRN
  Administered 2019-09-14 (×2): 500 mL

## 2019-09-14 MED ORDER — HEPARIN (PORCINE) IN NACL 1000-0.9 UT/500ML-% IV SOLN
INTRAVENOUS | Status: AC
  Filled 2019-09-14: qty 1000

## 2019-09-14 MED ORDER — LIDOCAINE HCL (PF) 1 % IJ SOLN
INTRAMUSCULAR | Status: AC
  Filled 2019-09-14: qty 30

## 2019-09-14 MED ORDER — IOHEXOL 350 MG/ML SOLN
INTRAVENOUS | Status: DC | PRN
  Administered 2019-09-14: 95 mL

## 2019-09-14 SURGICAL SUPPLY — 18 items
CATH BALLN WEDGE 5F 110CM (CATHETERS) ×1 IMPLANT
CATH INFINITI 5FR JL4 (CATHETERS) ×1 IMPLANT
CATH INFINITI JR4 5F (CATHETERS) ×1 IMPLANT
CATH OPTITORQUE TIG 4.0 5F (CATHETERS) ×1 IMPLANT
DEVICE RAD COMP TR BAND LRG (VASCULAR PRODUCTS) ×1 IMPLANT
ELECT DEFIB PAD ADLT CADENCE (PAD) ×1 IMPLANT
GLIDESHEATH SLEND A-KIT 6F 22G (SHEATH) ×1 IMPLANT
GUIDEWIRE .025 260CM (WIRE) ×1 IMPLANT
GUIDEWIRE ANGLED .035X150CM (WIRE) ×1 IMPLANT
GUIDEWIRE INQWIRE 1.5J.035X260 (WIRE) IMPLANT
INQWIRE 1.5J .035X260CM (WIRE) ×2
KIT HEART LEFT (KITS) ×2 IMPLANT
PACK CARDIAC CATHETERIZATION (CUSTOM PROCEDURE TRAY) ×2 IMPLANT
SHEATH GLIDE SLENDER 4/5FR (SHEATH) ×1 IMPLANT
TRANSDUCER W/STOPCOCK (MISCELLANEOUS) ×2 IMPLANT
TUBING CIL FLEX 10 FLL-RA (TUBING) ×2 IMPLANT
WIRE EMERALD 3MM-J .025X260CM (WIRE) ×1 IMPLANT
WIRE MICROINTRODUCER 60CM (WIRE) ×1 IMPLANT

## 2019-09-14 NOTE — Research (Signed)
PHDE Informed Consent   Subject Name: Vanessa Williamson  Subject met inclusion and exclusion criteria.  The informed consent form, study requirements and expectations were reviewed with the subject and questions and concerns were addressed prior to the signing of the consent form.  The subject verbalized understanding of the trail requirements.  The subject agreed to participate in the PHDE trial and signed the informed consent.  The informed consent was obtained prior to performance of any protocol-specific procedures for the subject.  A copy of the signed informed consent was given to the subject and a copy was placed in the subject's medical record.  [name] 09/14/2019, 9:08 PM

## 2019-09-14 NOTE — Interval H&P Note (Signed)
History and Physical Interval Note:  09/14/2019 4:41 PM  Vanessa Williamson  has presented today for surgery, with the diagnosis of heart failure.  The various methods of treatment have been discussed with the patient and family. After consideration of risks, benefits and other options for treatment, the patient has consented to  Procedure(s): RIGHT/LEFT HEART CATH AND CORONARY ANGIOGRAPHY (N/A) as a surgical intervention.  The patient's history has been reviewed, patient examined, no change in status, stable for surgery.  I have reviewed the patient's chart and labs.  Questions were answered to the patient's satisfaction.   Cath Lab Visit (complete for each Cath Lab visit)  Clinical Evaluation Leading to the Procedure:   ACS: Yes.    Non-ACS:    Anginal Classification: CCS IV  Anti-ischemic medical therapy: Minimal Therapy (1 class of medications)  Non-Invasive Test Results: High-risk stress test findings: cardiac mortality >3%/year  Prior CABG: No previous CABG        Adrian Prows

## 2019-09-14 NOTE — Progress Notes (Signed)
CCM progress   CT shows emphysema + CHF. Doubt ILD ABG without hypercapinia  Recent Labs  Lab 09/13/19 1200  PHART 7.385  PCO2ART 40.9  PO2ART 63.1*  HCO3 23.9  O2SAT 91.7     Plan = start BD nebs (done yesterday) -= cotninue CHF mgmt - await R and L HC - continue o2 - no role for bipap  CCM will round 09/15/19 after heart cath      SIGNATURE    Dr. Brand Males, M.D., F.C.C.P,  Pulmonary and Critical Care Medicine Staff Physician, [place] Director - Interstitial Lung Disease  Program  Pulmonary [place] at [place], [place], [address]  Pager: [phone], If no answer or between  15:00h - 7:00h: call [phone] Telephone: [phone]  11:03 AM 09/14/2019      Ct Chest High Resolution  Result Date: 09/13/2019 CLINICAL DATA:  Hypoxemia, respiratory failure. EXAM: CT CHEST WITHOUT CONTRAST TECHNIQUE: Multidetector CT imaging of the chest was performed following the standard protocol without intravenous contrast. High resolution imaging of the lungs, as well as inspiratory and expiratory imaging, was performed. COMPARISON:  09/04/2012. FINDINGS: Cardiovascular: Right PICC terminates in the right atrium. Atherosclerotic calcification of the aorta, aortic valve and coronary arteries. Pulmonic trunk and heart are enlarged, new from prior exams. No pericardial effusion. Mediastinum/Nodes: Edema/haziness throughout the mediastinal fat makes assessment for adenopathy challenging. There do appear to be several enlarged lymph nodes, measuring up to approximately 2.4 cm in the low right paratracheal station. Hilar regions are difficult to evaluate without IV contrast. Axillary adenopathy bilaterally is seen within index lesion on the right measuring 1.8 cm (6/48), previously 7 mm. Esophagus is grossly unremarkable. Lungs/Pleura: Fairly diffuse but basilar predominant septal thickening. Mosaic pulmonary  parenchymal attenuation manifests as air trapping on expiratory phase imaging. Centrilobular emphysema. Scattered areas of volume loss bilaterally. Mild dependent atelectasis in the lower lobes. There may be trace left pleural fluid. Airway is unremarkable. Upper Abdomen: Visualized portions of the liver, adrenal glands, kidneys, spleen, pancreas and stomach are grossly unremarkable. Musculoskeletal: Diffuse body wall edema. No worrisome lytic or sclerotic lesions. IMPRESSION: 1. New cardiac enlargement with fairly diffuse but basilar predominant septal thickening and trace left pleural effusion. Findings are likely due to congestive heart failure. An atypical/viral pneumonia cannot be definitively excluded. No evidence of fibrotic interstitial lung disease. 2. Extensive mediastinal and axillary adenopathy may be reactive. A lymphoproliferative disorder is not excluded. 3. Air trapping is indicative of small airways disease. 4.  Emphysema (ICD10-J43.9). 5. Aortic atherosclerosis (ICD10-170.0). Coronary artery calcification. 6. Enlarged pulmonic trunk, indicative of pulmonary arterial hypertension. Electronically Signed   By: Lorin Picket M.D.   On: 09/13/2019 15:29

## 2019-09-15 ENCOUNTER — Encounter (HOSPITAL_COMMUNITY): Payer: Self-pay | Admitting: Cardiology

## 2019-09-15 ENCOUNTER — Inpatient Hospital Stay (HOSPITAL_COMMUNITY): Payer: No Typology Code available for payment source

## 2019-09-15 LAB — BASIC METABOLIC PANEL
Anion gap: 4 — ABNORMAL LOW (ref 5–15)
BUN: 35 mg/dL — ABNORMAL HIGH (ref 8–23)
CO2: 26 mmol/L (ref 22–32)
Calcium: 8.4 mg/dL — ABNORMAL LOW (ref 8.9–10.3)
Chloride: 112 mmol/L — ABNORMAL HIGH (ref 98–111)
Creatinine, Ser: 1.18 mg/dL — ABNORMAL HIGH (ref 0.44–1.00)
GFR calc Af Amer: 56 mL/min — ABNORMAL LOW (ref >60)
GFR calc non Af Amer: 49 mL/min — ABNORMAL LOW (ref >60)
Glucose, Bld: 97 mg/dL (ref 70–99)
Potassium: 4.5 mmol/L (ref 3.5–5.1)
Sodium: 142 mmol/L (ref 135–145)

## 2019-09-15 LAB — C-REACTIVE PROTEIN: CRP: <0.8 mg/dL (ref <1.0)

## 2019-09-15 LAB — SEDIMENTATION RATE: Sed Rate: 125 mm/hr — ABNORMAL HIGH (ref 0–22)

## 2019-09-15 MED ORDER — TECHNETIUM TO 99M ALBUMIN AGGREGATED
1.6000 | Freq: Once | INTRAVENOUS | Status: AC | PRN
  Administered 2019-09-15: 1.6 via INTRAVENOUS

## 2019-09-15 MED ORDER — AMLODIPINE BESYLATE 5 MG PO TABS
5.0000 mg | ORAL_TABLET | Freq: Every day | ORAL | Status: DC
  Administered 2019-09-15 – 2019-09-16 (×2): 5 mg via ORAL
  Filled 2019-09-15 (×2): qty 1

## 2019-09-15 NOTE — TOC Progression Note (Addendum)
Transition of Care [facility]) - Progression Note    Patient Details  Name: Vanessa Williamson MRN: [id number] Date of Birth: [birth year]  Transition of Care [facility]) CM/SW [place], [place] Phone Number: [phone] 09/15/2019, 9:55 AM  Clinical Narrative:     CSW continues to search [place] agency to accept patient's insurance for [facility] RN for CHF management. CSW has reached out to [place], [facility], [facility] [facility], [place] and [facility] that have declined. Awaiting to hear back from [facility], and [facility] at this time.     Expected Discharge Plan: [place] Barriers to Discharge: Continued Medical Work up  Expected Discharge Plan and Services Expected Discharge Plan: [place] arrangements for the past 2 months: Single Family Home                           HH Arranged: RN           Social Determinants of Health (SDOH) Interventions    Readmission Risk Interventions No flowsheet data found.

## 2019-09-15 NOTE — Progress Notes (Signed)
NAME:  Vanessa Williamson, MRN:  [id number], DOB:  June 07, 1956, LOS: 5 ADMISSION DATE:  09/10/2019, CONSULTATION DATE:  09/13/2019 REFERRING MD:  Dr Doug Sou , CHIEF COMPLAINT:  Chronic resp failure with acute - rule out pulmonary pathology   History of present illness   63 year old obese african [place] female.  With obesity BMI 39, hypertension, heavy tobacco use, chronic stage III kidney disease patient had an echocardiogram July 20, 2019 that showed normal LV function 55% with grade 1 diastolic dysfunction but severe RV strain and severe pulmonary hypertension associated with severe eccentric mitral regurgitation.  She presented to [facility] on July 10, 2019 with worsening dyspnea of insidious onset.  She did have a cardiac stress test on September 06, 2019 which showed severe LV dysfunction with 30% ejection fraction and global hypokinesis on the stress portion.  She tells me that a year ago she could do gardening but more recently she is been unable to even get from one room to the other in a small house.  The worsening has been over the last 6 or 8 months.  In the cardiologist office she was noted to be desaturating to 85% while walking in the hallway.  Symptoms are associated with orthopnea and significant worsening of pedal edema.  No chest pain or palpitations.  Based on this she got admitted to the hospital for acute on chronic decompensated heart failure  Since admission she has been subject to diuresis with improvement in leg swelling but still requiring oxygen.  She is not sure if class IV dyspnea is better but she thinks it is better.  She is between 2 and 3 L of oxygen according to the nurse and saturating well.  As of September 13, 2019 she is 2 L negative since admission.  She has been weaned off dobutamine.  There are plans to do a right heart and left heart catheterization on September 14, 2019 but given her smoking history it was felt that underlying pulmonary pathology  particularly COPD needed to be ruled out and pulmonary has been consulted.   Noted: The hypoxemia is new this admission. She denies any collagen vascular disease or previous diagnosis of COPD.  Her smoking history is pretty heavy 2 packs a day since age of 2 and more recently half pack a day.  Review of systems: 11 point review of systems is negative other than stated in the history of present illness.  Past Medical History   has a past medical history of Anemia, Cancer ([place]), Chronic kidney disease, COPD (chronic obstructive pulmonary disease) ([place]), GERD (gastroesophageal reflux disease), and Hypertension.   Significant Labs/Imaging  CTHR 9/21 > New cardiac enlargement. Trace pleural effusion. Extensive mediastinal and axillary adenopathy. Air trapping and emphysema. Enlarged pulmonic trunk.  [place] 9/22 >  Moderate to severe pulmonary hypertension. Low wedge pressure and low LVEDP. LHC 9/22 > mild to moderate coronary artery disease. 30-40% LAD stenosis. LVEF 50%. RUQ [place] 9/23 > VQ 9/23 >   Labs CRP > ESR > SSA > SSB > SCL > GBM > ANCA > ANA > RF > ACE > HIV >  Interval/Subjective History  Feeling somewhat better. Lower extremity edema improved. No complaints.   Objective   Blood pressure 135/61, pulse 87, temperature 99.2 F (37.3 C), temperature source Oral, resp. rate 20, weight 96.1 kg, SpO2 96 %.        Intake/Output Summary (Last 24 hours) at 09/15/2019 0859 Last data filed at 09/15/2019 0827 Gross per  24 hour  Intake 610.44 ml  Output 1350 ml  Net -739.56 ml   Filed Weights   09/13/19 0536 09/14/19 0445 09/15/19 0536  Weight: 100.5 kg 97.4 kg 96.1 kg    Examination:  General: Overweight female in bed resting comfortably.  HENT: [place]/AT, PERRL, no appreciable when seated to approximately 50 degrees.  Lungs: Fine crackles bibasilar. No distress on  Cardiovascular: Regular rate and rhythm no murmurs Abdomen: Soft, non-tender, non-distended Extremities: 2+  bilateral lower extremity edema.  Neuro: Alert and oriented x3.  Speech normal.  Moves all 4 extremities. Musculoskeletal: Grossly intact. Skin: Intact on exposed areas.    LABS    PULMONARY Recent Labs  Lab 09/13/19 1200 09/14/19 1700 09/14/19 1715  PHART 7.385  --  7.414  PCO2ART 40.9  --  37.7  PO2ART 63.1*  --  59.0*  HCO3 23.9 24.8 24.1  TCO2  --  26 25  O2SAT 91.7 57.0 91.0    CBC Recent Labs  Lab 09/10/19 1706 09/14/19 1700 09/14/19 1715  HGB 9.6* 7.1* 7.1*  HCT 28.8* 21.0* 21.0*  WBC 3.5*  --   --   PLT 137*  --   --     COAGULATION No results for input(s): INR in the last 168 hours.  CARDIAC  No results for input(s): TROPONINI in the last 168 hours. No results for input(s): PROBNP in the last 168 hours.   CHEMISTRY Recent Labs  Lab 09/11/19 0550 09/12/19 0623 09/13/19 0629 09/14/19 0409 09/14/19 1700 09/14/19 1715 09/15/19 0429  NA 138 140 143 144 146* 145 142  K 4.6 4.7 4.8 4.7 4.8 4.8 4.5  CL 108 109 112* 113*  --   --  112*  CO2 '22 22 24 25  '$ --   --  26  GLUCOSE 98 91 98 91  --   --  97  BUN 37* 41* 42* 39*  --   --  35*  CREATININE 1.35* 1.56* 1.52* 1.29*  --   --  1.18*  CALCIUM 8.3* 8.1* 8.2* 8.4*  --   --  8.4*   Estimated Creatinine Clearance: 54.2 mL/min (A) (by C-G formula based on SCr of 1.18 mg/dL (H)).   LIVER Recent Labs  Lab 09/10/19 1706  AST 12*  ALT 9  ALKPHOS 49  BILITOT 1.1  PROT 6.7  ALBUMIN 3.1*     INFECTIOUS No results for input(s): LATICACIDVEN, PROCALCITON in the last 168 hours.   ENDOCRINE CBG (last 3)  No results for input(s): GLUCAP in the last 72 hours.   IMAGING x48h Ct Chest High Resolution  Result Date: 09/13/2019 CLINICAL DATA:  Hypoxemia, respiratory failure. EXAM: CT CHEST WITHOUT CONTRAST TECHNIQUE: Multidetector CT imaging of the chest was performed following the standard protocol without intravenous contrast. High resolution imaging of the lungs, as well as inspiratory and  expiratory imaging, was performed. COMPARISON:  09/04/2012. FINDINGS: Cardiovascular: Right PICC terminates in the right atrium. Atherosclerotic calcification of the aorta, aortic valve and coronary arteries. Pulmonic trunk and heart are enlarged, new from prior exams. No pericardial effusion. Mediastinum/Nodes: Edema/haziness throughout the mediastinal fat makes assessment for adenopathy challenging. There do appear to be several enlarged lymph nodes, measuring up to approximately 2.4 cm in the low right paratracheal station. Hilar regions are difficult to evaluate without IV contrast. Axillary adenopathy bilaterally is seen within index lesion on the right measuring 1.8 cm (6/48), previously 7 mm. Esophagus is grossly unremarkable. Lungs/Pleura: Fairly diffuse but basilar predominant septal thickening. Mosaic pulmonary  parenchymal attenuation manifests as air trapping on expiratory phase imaging. Centrilobular emphysema. Scattered areas of volume loss bilaterally. Mild dependent atelectasis in the lower lobes. There may be trace left pleural fluid. Airway is unremarkable. Upper Abdomen: Visualized portions of the liver, adrenal glands, kidneys, spleen, pancreas and stomach are grossly unremarkable. Musculoskeletal: Diffuse body wall edema. No worrisome lytic or sclerotic lesions. IMPRESSION: 1. New cardiac enlargement with fairly diffuse but basilar predominant septal thickening and trace left pleural effusion. Findings are likely due to congestive heart failure. An atypical/viral pneumonia cannot be definitively excluded. No evidence of fibrotic interstitial lung disease. 2. Extensive mediastinal and axillary adenopathy may be reactive. A lymphoproliferative disorder is not excluded. 3. Air trapping is indicative of small airways disease. 4.  Emphysema (ICD10-J43.9). 5. Aortic atherosclerosis (ICD10-170.0). Coronary artery calcification. 6. Enlarged pulmonic trunk, indicative of pulmonary arterial hypertension.  Electronically Signed   By: Lorin Picket M.D.   On: 09/13/2019 15:29     Resolved Hospital Problem list   xx  Assessment & Plan:   Acute hypoxemic respiratory failure: Seeming more and more like this is secondary to cor pulmonale and pulmonary hypertension. Suspect this is all in the setting of undiagnosed obstructive lung disease given her long smoking history. Will need to rule out other causes of pulmonary hypertension as well. ABG not consistent with severe OSA, LHC with EF 50% and normal pressures. RHC with elevated R heart pressures.   Plan: Supplemental O2 to keep SpO2 90-95% Ongoing diuresis per primary team VQ scan Autoimmune workup as above HIV test RUQ [place] to assess for cirrhosis Will need PFT if she is able to return to baseline.  Smoking cessation, she seems committed to this.  Treatment will be dependent on above.   Will continue to follow    Best practice:    -Per the primary cardiology team    SIGNATURE    Georgann Housekeeper, AGACNP-BC [place] Pager [phone] or [phone]  09/15/2019 10:00 AM

## 2019-09-15 NOTE — Progress Notes (Signed)
Subjective:  Feels much better, states that dyspnea has improved, leg edema has also improved.  Intake/Output from previous day:  I/O last 3 completed shifts: In: 819.3 [P.O.:460; I.V.:359.3] Out: 3300 [Urine:3300] Total I/O In: 255 [P.O.:240; I.V.:15] Out: -   Blood pressure 134/88, pulse 78, temperature 98.2 F (36.8 C), temperature source Oral, resp. rate 18, weight 96.1 kg, SpO2 95 %. Physical Exam  Constitutional:  Moderately built and moderately obese appears to be in mild respiratory distress.  Appears ill and older than stated age.  HENT:  Head: Atraumatic.  Eyes: Conjunctivae are normal.  Neck: Neck supple. No thyromegaly present.  Cardiovascular: Normal rate, regular rhythm and intact distal pulses. Exam reveals no gallop.  No murmur heard. Pulses:      Carotid pulses are 2+ on the right side and 2+ on the left side.      Femoral pulses are 1+ on the right side and 1+ on the left side.      Popliteal pulses are 0 on the right side and 0 on the left side.       Dorsalis pedis pulses are 0 on the right side and 0 on the left side.       Posterior tibial pulses are 0 on the right side and 0 on the left side.  2+ bilateral leg edema, pitting.  JVD elevated to the angle of the jaw.  Pulmonary/Chest: Effort normal and breath sounds normal.  Abdominal: Soft. Bowel sounds are normal.  Musculoskeletal: Normal range of motion.  Neurological: She is alert.  Skin: Skin is warm and dry.  Psychiatric: She has a normal mood and affect.   Lab Results: BMP BNP (last 3 results) Recent Labs    09/11/19 0550 09/12/19 0623 09/13/19 0629  BNP 843.8* 715.6* 636.0*    ProBNP (last 3 results) No results for input(s): PROBNP in the last 8760 hours. BMP Latest Ref Rng & Units 09/15/2019 09/14/2019 09/14/2019  Glucose 70 - 99 mg/dL 97 - -  BUN 8 - 23 mg/dL 35(H) - -  Creatinine 0.44 - 1.00 mg/dL 1.18(H) - -  BUN/Creat Ratio 12 - 28 - - -  Sodium 135 - 145 mmol/L 142 145 146(H)   Potassium 3.5 - 5.1 mmol/L 4.5 4.8 4.8  Chloride 98 - 111 mmol/L 112(H) - -  CO2 22 - 32 mmol/L 26 - -  Calcium 8.9 - 10.3 mg/dL 8.4(L) - -   Hepatic Function Latest Ref Rng & Units 09/10/2019 08/17/2019 07/20/2019  Total Protein 6.5 - 8.1 g/dL 6.7 - -  Albumin 3.5 - 5.0 g/dL 3.1(L) 3.2(L) 3.3(L)  AST 15 - 41 U/L 12(L) - -  ALT 0 - 44 U/L 9 - -  Alk Phosphatase 38 - 126 U/L 49 - -  Total Bilirubin 0.3 - 1.2 mg/dL 1.1 - -   CBC Latest Ref Rng & Units 09/14/2019 09/14/2019 09/10/2019  WBC 4.0 - 10.5 K/uL - - 3.5(L)  Hemoglobin 12.0 - 15.0 g/dL 7.1(L) 7.1(L) 9.6(L)  Hematocrit 36.0 - 46.0 % 21.0(L) 21.0(L) 28.8(L)  Platelets 150 - 400 K/uL - - 137(L)   Lipid Panel     Component Value Date/Time   CHOL 157 07/20/2018 1452   TRIG 98 07/20/2018 1452   HDL 42 07/20/2018 1452   CHOLHDL 3.7 07/20/2018 1452   CHOLHDL 4.3 12/10/2013 0856   VLDL 18 12/10/2013 0856   LDLCALC 95 07/20/2018 1452   Cardiac Panel (last 3 results) No results for input(s): CKTOTAL, CKMB, TROPONINI, RELINDX in  the last 72 hours.  HEMOGLOBIN A1C No results found for: HGBA1C, MPG TSH No results for input(s): TSH in the last 8760 hours. Imaging: :Imaging results have been reviewed  Cardiac Studies:  Lexiscan Sestamibi Stress Test 09/06/2019: 1. Stress EKG is non-diagnostic, as this is pharmacological stress test. 2. The LV is dilated in both rest and stress images with LV EDV of 156 mL. There is very small decrease in uptake in the anterior and apical lateral wall suggestive of very small sized ischemia. Stress LV EF is severely dysfunctional 30% with global hypokinesis.  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 However, balanced ischemia and multivessel CAD cannot be excluded.  3. In addition, the right ventricle appears to be severely dilated.  4. High risk study. No previous exam available for comparison.  Echo 07/12/2019:  1. The left  ventricle has low normal systolic function, with an ejection fraction of 50-55%. The cavity size was mildly dilated. Left ventricular diastolic Doppler parameters are consistent with impaired relaxation. There is right ventricular volume and  pressure overload.  2. The right ventricle has moderately reduced systolic function. The cavity was mildly enlarged. There is no increase in right ventricular wall thickness.  3. Left atrial size was moderately dilated.  4. Right atrial size was moderately dilated.  5. Mild thickening of the mitral valve leaflet. Mitral valve regurgitation is moderate to severe by color flow Doppler.  6. The MV is moderately thickened. There is at least moderate to severe eccentric MR associated with pulmonary HTN and RV pressure/volume overlaod. RVSP 65-71mHG. Would suggest TEE to further evaluate.  7. The aortic valve is tricuspid. Mild calcification of the aortic valve.  8. Pulmonic valve regurgitation is moderate by color flow Doppler.  9. The aorta is normal in size and structure. 10. The inferior vena cava was dilated in size with >50% respiratory variability.  R + L Heart Catheterization 09/14/19  Very mild to moderate coronary artery disease especially involving proximal LAD about 30 to 40% stenosis.  Right dominant circulation, minimal disease in the right.  Large ramus intermediate and circumflex coronary artery with mild disease.  Normal LVEF at 50%. Moderate to severe pulmonary hypertension.  Low wedge pressure and low LVEDP, findings are consistent with primary pulmonary hypertension.  RA 9/8, mean 7 mmHg; RV 61/6, EDP 9 mmHg; PA 64/23, mean 39 mmHg, PA saturation 57%.  PW 12/10, mean 10 mmHg. LV 164/8, EDP 9 mmHg.  Aorta 160/72, mean 105 mmHg.  Aortic saturation 91%. QP/QS 1.00.  CO 6.03, CI 3.0.    Recommendation: Patient will need therapy for primary pulmonary hypertension.  Scheduled Meds: . amLODipine  5 mg Oral Daily  . aspirin EC  81 mg Oral Daily   . budesonide (PULMICORT) nebulizer solution  0.25 mg Nebulization BID  . Chlorhexidine Gluconate Cloth  6 each Topical Q0600  . enoxaparin (LOVENOX) injection  40 mg Subcutaneous Q24H  . ipratropium-albuterol  3 mL Nebulization TID  . metoprolol succinate  25 mg Oral Daily  . nicotine  21 mg Transdermal Daily  . potassium chloride  10 mEq Oral BID  . sodium chloride flush  10-40 mL Intracatheter Q12H  . sodium chloride flush  3 mL Intravenous Q12H   Continuous Infusions: PRN Meds:.acetaminophen, albuterol, ondansetron (ZOFRAN) IV, sodium chloride flush  Assessment/Plan:  1. Shortness of breath         2.  Acute on chronic diastolic heart failure 3.  Chronic cor pulmonale with acute right ventricular systolic heart failure secondary to primary pulmonary hypertension and also COPD. 4.  Severe pulmonary hypertension, suspect WHO group 1. Doubt Group 2 and 3, in view of severity of PH and out of proportion 5.  Primary hypertension 6.  COPD and tobacco use disorder 7.  Hypoxemia, SPO2 in the office at room air 85%. 8.  Morbid obesity, BMI 39+. 9.  NSVT, one episode of 4 beat on telemetry.  Recommendation: I have discontinued dobutamine today, also discontinue isosorbide mononitrate and switch her to amlodipine.  This is in view of potential for addition of PDE 5 inhibitor for PAH.  She will also need very aggressive therapy with possibly ERA/prostacyclin analog.  I will also transition her to oral furosemide.  In view of her new findings, with markedly elevated PA pressure and low EDP and pulmonary capillary wedge pressure, she needs to be further evaluated for secondary causes of PAH, VQ scan and serology needs to be performed.  I discussed with Dr. Chase Caller.  We also need to assess for severity of COPD.  She will need home oxygen, will place orders for the same.  With regard to diet and exercise, weight loss, patient appears to be very motivated including smoking cessation.  I have  discussed with her regarding the gravity of the disease process.  I will continue to reinforce compliance and follow her closely.  Potential discharge tomorrow morning.  Adrian Prows, M.D. 09/15/2019, 5:08 PM [facility] Pager: [phone] Office: [phone] If no answer: [phone]

## 2019-09-15 NOTE — Progress Notes (Signed)
Patient ate 25% of her Lunch after asked her to wait to eat she forgot.. Also expressed not being able to lay flat for 45 mins for NM testing.

## 2019-09-15 NOTE — Progress Notes (Signed)
Vanessa Williamson with [facility] informed of patient's potential discharge this evening or tomorrow and need for home oxygen.   Unable to order O2 until sats note is in, continues to pend at this time. RN made aware.   [place], [place]

## 2019-09-15 NOTE — Progress Notes (Signed)
SATURATION QUALIFICATIONS: (This note is used to comply with regulatory documentation for home oxygen)  Patient Saturations on Room Air at Rest = 86%  Patient Saturations on Room Air while Ambulating = 85%  Patient Saturations on 4 Liters of oxygen while Ambulating = 94 %  Please briefly explain why patient needs home oxygen: [place] in 80s

## 2019-09-15 NOTE — Discharge Summary (Addendum)
Physician Discharge Summary  Patient ID: Vanessa Williamson MRN: [id number] DOB/AGE: 04/14/1956 63 y.o.  Admit date: 09/10/2019 Discharge date: 09/16/2019  Primary Discharge Diagnosis 1.  Acute on chronic diastolic heart failure 2.  Acute on chronic right ventricular systolic heart failure, chronic cor pulmonale 3.  Primary pulmonary hypertension, moderate to severe, low capillary wedge and LVEDP. 4. COPD and hypoxemia without hypercapnea.   Secondary Discharge Diagnosis Tobacco use disorder Moderate obesity Chronic stage III kidney disease Essential hypertension  Consults: Pulmonary Dr. Brand Males: CT shows emphysema + CHF. Doubt ILD ABG without hypercapinia  Last Labs      Recent Labs  Lab 09/13/19 1200  PHART 7.385  PCO2ART 40.9  PO2ART 63.1*  HCO3 23.9  O2SAT 91.7      Plan = start BD nebs (done yesterday) -= cotninue CHF mgmt - await R and L HC - continue o2 - no role for bipap  Significant Diagnostic Studies: 09/16/19  Patient Saturations on Room Air at Rest = 86%.  Patient Saturations on Room Air while Ambulating = 85%.  Patient Saturations on 4 Liters of oxygen while Ambulating = 94 %  Lexiscan Sestamibi Stress Test 09/06/2019: 1. Stress EKG is non-diagnostic, as this is pharmacological stress test. 2. The LV is dilated in both rest and stress images with LV EDV of 156 mL. There is very small decrease in uptake in the anterior and apical lateral wall suggestive of very small sized ischemia. Stress LV EF is severely dysfunctional 30% with global hypokinesis.  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 However, balanced ischemia and multivessel CAD cannot be excluded.  3. In addition, the right ventricle appears to be severely dilated.  4. High risk study. No previous exam available for comparison.  Echo 07/12/2019:  1. The left ventricle has low normal systolic function, with an ejection  fraction of 50-55%. The cavity size was mildly dilated. Left ventricular diastolic Doppler parameters are consistent with impaired relaxation. There is right ventricular volume and  pressure overload.  2. The right ventricle has moderately reduced systolic function. The cavity was mildly enlarged. There is no increase in right ventricular wall thickness.  3. Left atrial size was moderately dilated.  4. Right atrial size was moderately dilated.  5. Mild thickening of the mitral valve leaflet. Mitral valve regurgitation is moderate to severe by color flow Doppler.  6. The MV is moderately thickened. There is at least moderate to severe eccentric MR associated with pulmonary HTN and RV pressure/volume overlaod. RVSP 65-41mmHG. Would suggest TEE to further evaluate.  7. The aortic valve is tricuspid. Mild calcification of the aortic valve.  8. Pulmonic valve regurgitation is moderate by color flow Doppler.  9. The aorta is normal in size and structure. 10. The inferior vena cava was dilated in size with >50% respiratory variability.  EKG 08/09/2019: Normal sinus rhythm at 78 bpm, 1 PVC, normal axis, PRWP, T wave inversion in anteroseptal leads, cannot exclude ischemia. Abnormal EKG.  R + L Heart Catheterization 09/14/19 Very mild to moderate coronary artery disease especially involving proximal LAD about 30 to 40% stenosis. Right dominant circulation, minimal disease in the right. Large ramus intermediate and circumflex coronary artery with mild disease. Normal LVEF at 50%. Moderate to severe pulmonary hypertension. Low wedge pressure and low LVEDP, findings are consistent with primary pulmonary hypertension.  RA 9/8, mean 7 mmHg; RV 61/6, EDP 9 mmHg; PA 64/23, mean 39 mmHg, PA saturation 57%.  PW 12/10, mean 10 mmHg. LV 164/8, EDP 9 mmHg. Aorta 160/72, mean 105 mmHg. Aortic saturation 91%. QP/QS 1.00. CO 6.03, CI 3.0. Recommendation:Patient will need therapy for primary pulmonary  hypertension.  VQ scan 09/15/2019:  Mildly heterogeneous perfusion, without wedge-shaped defects suggestive of pulmonary embolism.  Ventilation imaging was not performed.  Corresponding chest radiographs suggest mild interstitial edema with right lower lobe atelectasis and small right pleural effusion.  Hospital Course: Patient admitted for acute exacerbation of chronic diastolic heart failure, left ventricle and acute right ventricular on chronic right ventricular systolic heart failure due to cor pulmonale, as a working diagnosis.  Patient was also started on dobutamine to improve urine output.   Patient diuresed a total of 5.1 L.  She underwent right and left heart catheterization revealing moderate to severe pulmonary hypertension, findings are suggestive of primary PAH.  She had low pulmonary capillary wedge pressure and LVEDP.  Preserved cardiac output and cardiac index.  ARB was discontinued due to acute renal insufficiency with diuresis, however this did improve with dobutamine infusion.  Serology was sent, VQ scan was negative for PE, on the day of discharge she was still slightly fluid overloaded but stable for discharge.  She was hypoxemic hence  home oxygen therapy was prescribed.  Patient was supposed to go to the outpatient infusion center for Fera Heme infusion, however 1 dose was given today prior to discharge.  This was ordered by Dr. Corliss Parish (nephrology) previously.  Recommendations on discharge: Patient to be discharged home, I have discontinued isosorbide mononitrate, I plan to start her on  Revatio and also start the patient on Prostacyclin receptor agonist. Will need w/u of primary PH, suspect WHO Group I. Doubt group III. She will need follow-up with pulmonary medicine Dr. Chase Caller.  Plan was to not also add BiDil as patient was being evaluated for primary PAH and will need vasodilator therapy with PDE 5 inhibitors and also prostacyclin analog and/or ERA  therapy for primary pulmonary hypertension.  Valsartan HCT has been discontinued.  Weight at discharge was 211 pounds.  Lasix was changed to Demadex 20 mg p.o. twice daily. Home RN visits to manage CHF was set up.   Discharge Exam: Blood pressure (!) 143/75, pulse 81, temperature 98.6 F (37 C), temperature source Oral, resp. rate 20, weight 96.1 kg, SpO2 93 %. Body mass index is 36.37 kg/m.  Vitals with BMI 09/16/2019 09/16/2019 09/16/2019  Height - - -  Weight - 211 lbs 14 oz -  BMI - 47.82 -  Systolic 956 - 213  Diastolic 75 - 76  Pulse 81 - 78    Physical Exam  Constitutional:  Moderately built and moderately obese  in no acute distress.  Appears ill and older than stated age.  HENT:  Head: Atraumatic.  Eyes: Conjunctivae are normal.  Neck: Neck supple. No thyromegaly present.  Cardiovascular: Normal rate, regular rhythm and intact distal pulses. Exam reveals no gallop.  No murmur heard. Pulses:      Carotid pulses are 2+ on the right side and 2+ on the left side.      Femoral pulses are 1+ on the right side and 1+ on the left side.      Popliteal pulses are 0 on the right side and 0 on the left side.       Dorsalis pedis pulses are 0 on the right side and 0 on the left side.       Posterior tibial pulses are 0 on the  right side and 0 on the left side.  2+ bilateral leg edema, pitting.  JVD  elevated mid neck.   Pulmonary/Chest: Effort normal. She has wheezes (bilateral and diffuse).  Abdominal: Soft. Bowel sounds are normal.  Musculoskeletal: Normal range of motion.  Neurological: She is alert.  Skin: Skin is warm and dry.  Psychiatric: She has a normal mood and affect.   Labs:   Lab Results  Component Value Date   WBC 3.5 (L) 09/10/2019   HGB 7.1 (L) 09/14/2019   HCT 21.0 (L) 09/14/2019   MCV 110.8 (H) 09/10/2019   PLT 137 (L) 09/10/2019    Recent Labs  Lab 09/10/19 1706  09/15/19 0429  NA 137   < > 142  K 4.2   < > 4.5  CL 108   < > 112*  CO2 22   < > 26  BUN  36*   < > 35*  CREATININE 1.43*   < > 1.18*  CALCIUM 8.3*   < > 8.4*  PROT 6.7  --   --   BILITOT 1.1  --   --   ALKPHOS 49  --   --   ALT 9  --   --   AST 12*  --   --   GLUCOSE 114*   < > 97   < > = values in this interval not displayed.   Lipid Panel     Component Value Date/Time   CHOL 157 07/20/2018 1452   TRIG 98 07/20/2018 1452   HDL 42 07/20/2018 1452   CHOLHDL 3.7 07/20/2018 1452   CHOLHDL 4.3 12/10/2013 0856   VLDL 18 12/10/2013 0856   LDLCALC 95 07/20/2018 1452    BNP (last 3 results) Recent Labs    09/11/19 0550 09/12/19 0623 09/13/19 0629  BNP 843.8* 715.6* 636.0*   HEMOGLOBIN A1C No results found for: HGBA1C, MPG  Cardiac Panel (last 3 results) No results for input(s): CKTOTAL, CKMB, TROPONINI, RELINDX in the last 8760 hours.  No results found for: CKTOTAL, CKMB, CKMBINDEX, TROPONINI   TSH No results for input(s): TSH in the last 8760 hours.  Radiology: Dg Chest 2 View  Result Date: 09/15/2019 CLINICAL DATA:  Shortness of breath for 4 days. EXAM: CHEST - 2 VIEW COMPARISON:  CT chest 09/13/2019. PA and lateral chest 09/10/2019 and 08/18/2012. FINDINGS: New right PICC is in place with the tip projecting in the lower superior vena cava. The lungs are emphysematous. There is cardiomegaly and interstitial pulmonary edema. Tiny bilateral pleural effusions are seen. Atherosclerosis noted. No pneumothorax. No acute bony abnormality. IMPRESSION: Tip of right PICC projects in the lower superior vena cava. No change in cardiomegaly and interstitial edema. Trace pleural effusions noted. Emphysema. Atherosclerosis. Electronically Signed   By: Inge Rise M.D.   On: 09/15/2019 11:05   Dg Chest 2 View  Result Date: 09/10/2019 CLINICAL DATA:  Shortness of breath, leg swelling EXAM: CHEST - 2 VIEW COMPARISON:  08/18/2012 FINDINGS: Cardiomegaly. Mild, diffuse bilateral interstitial pulmonary opacity. Pulmonary hyperinflation. Status post left glenohumeral  arthroplasty. IMPRESSION: Cardiomegaly. Mild, diffuse bilateral interstitial pulmonary opacity, likely edema. No focal airspace opacity. Electronically Signed   By: Eddie Candle M.D.   On: 09/10/2019 18:57   Nm Pulmonary Perfusion  Result Date: 09/15/2019 CLINICAL DATA:  Newly diagnosed pulmonary hypertension, evaluate for chronic thromboembolism EXAM: NUCLEAR MEDICINE PERFUSION LUNG SCAN TECHNIQUE: Perfusion images were obtained in multiple projections after intravenous injection of radiopharmaceutical. Ventilation scans intentionally deferred if perfusion scan and  chest x-ray adequate for interpretation during COVID 19 epidemic. RADIOPHARMACEUTICALS:  1.6 mCi Tc-10m MAA IV COMPARISON:  High-resolution CT chest dated 09/13/2019 FINDINGS: Mildly heterogeneous perfusion, without wedge-shaped defects suggestive of pulmonary embolism. Ventilation imaging was not performed. Corresponding chest radiographs suggest mild interstitial edema with right lower lobe atelectasis and small right pleural effusion. IMPRESSION: No convincing findings to suggest pulmonary embolism. Electronically Signed   By: Julian Hy M.D.   On: 09/15/2019 17:33   Ct Chest High Resolution  Result Date: 09/13/2019 CLINICAL DATA:  Hypoxemia, respiratory failure. EXAM: CT CHEST WITHOUT CONTRAST TECHNIQUE: Multidetector CT imaging of the chest was performed following the standard protocol without intravenous contrast. High resolution imaging of the lungs, as well as inspiratory and expiratory imaging, was performed. COMPARISON:  09/04/2012. FINDINGS: Cardiovascular: Right PICC terminates in the right atrium. Atherosclerotic calcification of the aorta, aortic valve and coronary arteries. Pulmonic trunk and heart are enlarged, new from prior exams. No pericardial effusion. Mediastinum/Nodes: Edema/haziness throughout the mediastinal fat makes assessment for adenopathy challenging. There do appear to be several enlarged lymph nodes,  measuring up to approximately 2.4 cm in the low right paratracheal station. Hilar regions are difficult to evaluate without IV contrast. Axillary adenopathy bilaterally is seen within index lesion on the right measuring 1.8 cm (6/48), previously 7 mm. Esophagus is grossly unremarkable. Lungs/Pleura: Fairly diffuse but basilar predominant septal thickening. Mosaic pulmonary parenchymal attenuation manifests as air trapping on expiratory phase imaging. Centrilobular emphysema. Scattered areas of volume loss bilaterally. Mild dependent atelectasis in the lower lobes. There may be trace left pleural fluid. Airway is unremarkable. Upper Abdomen: Visualized portions of the liver, adrenal glands, kidneys, spleen, pancreas and stomach are grossly unremarkable. Musculoskeletal: Diffuse body wall edema. No worrisome lytic or sclerotic lesions. IMPRESSION: 1. New cardiac enlargement with fairly diffuse but basilar predominant septal thickening and trace left pleural effusion. Findings are likely due to congestive heart failure. An atypical/viral pneumonia cannot be definitively excluded. No evidence of fibrotic interstitial lung disease. 2. Extensive mediastinal and axillary adenopathy may be reactive. A lymphoproliferative disorder is not excluded. 3. Air trapping is indicative of small airways disease. 4.  Emphysema (ICD10-J43.9). 5. Aortic atherosclerosis (ICD10-170.0). Coronary artery calcification. 6. Enlarged pulmonic trunk, indicative of pulmonary arterial hypertension. Electronically Signed   By: Lorin Picket M.D.   On: 09/13/2019 15:29   [place] Ekg Site Rite  Result Date: 09/12/2019 If Site Rite image not attached, placement could not be confirmed due to current cardiac rhythm.  US Abdomen Limited Ruq  Result Date: 09/15/2019 CLINICAL DATA:  Assess for cirrhosis EXAM: ULTRASOUND ABDOMEN LIMITED RIGHT UPPER QUADRANT COMPARISON:  None. FINDINGS: Gallbladder: No gallstones or wall thickening visualized. No  sonographic Murphy sign noted by sonographer. Common bile duct: Diameter: Normal caliber, 3 mm Liver: Heterogeneous echotexture throughout the liver with subtle nodularity of the liver surface suggesting early cirrhosis. No focal hepatic abnormality. Portal vein is patent on color Doppler imaging with normal direction of blood flow towards the liver. Other: Perihepatic ascites and right pleural effusion noted. IMPRESSION: Subtle nodularity to the liver surface suggests early cirrhosis. No focal hepatic abnormality. Perihepatic ascites, right pleural effusion. Electronically Signed   By: Rolm Baptise M.D.   On: 09/15/2019 19:08   Follow-up Information    Adrian Prows, MD. Go on 09/27/2019.   Specialty: Cardiology Why: @2 :00pm with Jeri Lager, NP Contact information: [place] [address] [phone]        Brand Males, MD. Call in  3 day(s).   Specialty: Pulmonary Disease Why: Request to be seen in 2-3 weeks for post hospital f/u of shortness of breath Contact information: [place] [address] [phone]             Allergies as of 09/16/2019   No Known Allergies     Medication List    STOP taking these medications   furosemide 40 MG tablet Commonly known as: LASIX   isosorbide mononitrate 60 MG 24 hr tablet Commonly known as: IMDUR   meloxicam 7.5 MG tablet Commonly known as: MOBIC   valsartan-hydrochlorothiazide 320-25 MG tablet Commonly known as: DIOVAN-HCT     TAKE these medications   acetaminophen 325 MG tablet Commonly known as: TYLENOL Take 2 tablets (650 mg total) by mouth every 4 (four) hours as needed for headache or mild pain.   amLODipine 5 MG tablet Commonly known as: NORVASC Take 1 tablet (5 mg total) by mouth daily. Start taking on: September 17, 2019   aspirin 81 MG EC tablet Take 1 tablet (81 mg total) by mouth daily. Start taking on: September 17, 2019   budesonide 0.25 MG/2ML  nebulizer solution Commonly known as: PULMICORT Take 2 mLs (0.25 mg total) by nebulization 2 (two) times daily.   hydrALAZINE 25 MG tablet Commonly known as: APRESOLINE Take 1 tablet (25 mg total) by mouth 3 (three) times daily.   loratadine 10 MG tablet Commonly known as: CLARITIN Take 10 mg by mouth daily.   metoprolol succinate 25 MG 24 hr tablet Commonly known as: TOPROL-XL Take 1 tablet (25 mg total) by mouth daily. Start taking on: September 17, 2019   potassium chloride 10 MEQ tablet Commonly known as: K-DUR Take 1 tablet (10 mEq total) by mouth 2 (two) times daily.   sildenafil 20 MG tablet Commonly known as: Revatio Take 1 tablet (20 mg total) by mouth 3 (three) times daily.   torsemide 20 MG tablet Commonly known as: DEMADEX Take 1 tablet (20 mg total) by mouth 2 (two) times daily with a meal.            Durable Medical Equipment  (From admission, onward)         Start     Ordered   09/15/19 1725  For home use only DME oxygen  Once    Question Answer Comment  Length of Need Lifetime   Mode or (Route) Nasal cannula   Liters per Minute 3   Frequency Continuous (stationary and portable oxygen unit needed)   Oxygen conserving device Yes   Oxygen delivery system Gas      09/15/19 1724         Follow-up Information    Adrian Prows, MD. Go on 09/27/2019.   Specialty: Cardiology Why: @2 :00pm with Jeri Lager, NP Contact information: [place] [address] [phone]        Brand Males, MD. Call in 3 day(s).   Specialty: Pulmonary Disease Why: Request to be seen in 2-3 weeks for post hospital f/u of shortness of breath Contact information: [address] [place] [address] [place] [address] [phone]          Adrian Prows, MD 09/16/2019, 2:07 PM  Pager: [phone] Office: [phone] If no answer: [phone]

## 2019-09-15 NOTE — Plan of Care (Signed)
  Problem: Education: Goal: Knowledge of General Education information will improve Description: Including pain rating scale, medication(s)/side effects and non-pharmacologic comfort measures Outcome: Progressing   Problem: Health Behavior/Discharge Planning: Goal: Ability to manage health-related needs will improve Outcome: Progressing   Problem: Clinical Measurements: Goal: Will remain free from infection Outcome: Progressing Goal: Respiratory complications will improve Outcome: Progressing   Problem: Activity: Goal: Risk for activity intolerance will decrease Outcome: Progressing   Problem: Safety: Goal: Ability to remain free from injury will improve Outcome: Progressing   Problem: Education: Goal: Ability to demonstrate management of disease process will improve Outcome: Progressing Goal: Ability to verbalize understanding of medication therapies will improve Outcome: Progressing Goal: Individualized Educational Video(s) Outcome: Progressing   Problem: Cardiac: Goal: Ability to achieve and maintain adequate cardiopulmonary perfusion will improve Outcome: Progressing

## 2019-09-16 DIAGNOSIS — J449 Chronic obstructive pulmonary disease, unspecified: Secondary | ICD-10-CM

## 2019-09-16 DIAGNOSIS — I129 Hypertensive chronic kidney disease with stage 1 through stage 4 chronic kidney disease, or unspecified chronic kidney disease: Secondary | ICD-10-CM

## 2019-09-16 DIAGNOSIS — N183 Chronic kidney disease, stage 3 (moderate): Secondary | ICD-10-CM

## 2019-09-16 LAB — ANTINUCLEAR ANTIBODIES, IFA: ANA Ab, IFA: NEGATIVE

## 2019-09-16 LAB — ANTI-SCLERODERMA ANTIBODY: Scleroderma (Scl-70) (ENA) Antibody, IgG: <0.2 AI (ref 0.0–0.9)

## 2019-09-16 LAB — SJOGRENS SYNDROME-B EXTRACTABLE NUCLEAR ANTIBODY: SSB (La) (ENA) Antibody, IgG: <0.2 AI (ref 0.0–0.9)

## 2019-09-16 LAB — RHEUMATOID FACTOR: Rheumatoid fact SerPl-aCnc: <10 IU/mL (ref 0.0–13.9)

## 2019-09-16 LAB — SJOGRENS SYNDROME-A EXTRACTABLE NUCLEAR ANTIBODY: SSA (Ro) (ENA) Antibody, IgG: 4.5 AI — ABNORMAL HIGH (ref 0.0–0.9)

## 2019-09-16 LAB — HIV ANTIBODY (ROUTINE TESTING W REFLEX): HIV Screen 4th Generation wRfx: NONREACTIVE

## 2019-09-16 LAB — ANTI-DNA ANTIBODY, DOUBLE-STRANDED: ds DNA Ab: 3 IU/mL (ref 0–9)

## 2019-09-16 LAB — ANGIOTENSIN CONVERTING ENZYME: Angiotensin-Converting Enzyme: 46 U/L (ref 14–82)

## 2019-09-16 MED ORDER — BUDESONIDE 0.25 MG/2ML IN SUSP: 0.2500 mg | Freq: Two times a day (BID) | RESPIRATORY_TRACT | 12 refills | Status: AC

## 2019-09-16 MED ORDER — ACETAMINOPHEN 325 MG PO TABS: 650.0000 mg | ORAL_TABLET | ORAL | Status: AC | PRN

## 2019-09-16 MED ORDER — ASPIRIN 81 MG PO TBEC: 81.0000 mg | DELAYED_RELEASE_TABLET | Freq: Every day | ORAL | Status: DC

## 2019-09-16 MED ORDER — SILDENAFIL CITRATE 20 MG PO TABS: 20.0000 mg | ORAL_TABLET | Freq: Three times a day (TID) | ORAL | 1 refills | Status: DC

## 2019-09-16 MED ORDER — POTASSIUM CHLORIDE CRYS ER 10 MEQ PO TBCR: 10.0000 meq | EXTENDED_RELEASE_TABLET | Freq: Two times a day (BID) | ORAL | 1 refills | Status: DC

## 2019-09-16 MED ORDER — SODIUM CHLORIDE 0.9 % IV SOLN
510.0000 mg | Freq: Once | INTRAVENOUS | Status: AC
  Administered 2019-09-16: 510 mg via INTRAVENOUS
  Filled 2019-09-16: qty 17

## 2019-09-16 MED ORDER — SILDENAFIL CITRATE 20 MG PO TABS: 20.0000 mg | ORAL_TABLET | Freq: Three times a day (TID) | ORAL | Status: DC

## 2019-09-16 MED ORDER — AMLODIPINE BESYLATE 5 MG PO TABS: 5.0000 mg | ORAL_TABLET | Freq: Every day | ORAL | 1 refills | Status: DC

## 2019-09-16 MED ORDER — TORSEMIDE 20 MG PO TABS: 20.0000 mg | ORAL_TABLET | Freq: Two times a day (BID) | ORAL | 1 refills | Status: DC

## 2019-09-16 MED ORDER — HYDRALAZINE HCL 25 MG PO TABS: 25.0000 mg | ORAL_TABLET | Freq: Three times a day (TID) | ORAL | 2 refills | Status: DC

## 2019-09-16 MED ORDER — TORSEMIDE 20 MG PO TABS: 20.0000 mg | ORAL_TABLET | Freq: Two times a day (BID) | ORAL | Status: DC

## 2019-09-16 MED ORDER — METOPROLOL SUCCINATE ER 25 MG PO TB24: 25.0000 mg | ORAL_TABLET | Freq: Every day | ORAL | 1 refills | Status: DC

## 2019-09-16 NOTE — Discharge Instructions (Signed)
Low Sodium Nutrition Therapy  °Eating less sodium can help you if you have high blood pressure, heart failure, or kidney or liver disease.  ° °Your body needs a little sodium, but too much sodium can cause your body to hold onto extra water. This extra water will raise your blood pressure and can cause damage to your heart, kidneys, or liver as they are forced to work harder.  ° °Sometimes you can see how the extra fluid affects you because your hands, legs, or belly swell. You may also hold water around your heart and lungs, which makes it hard to breathe.  ° °Even if you take medication for blood pressure or a water pill (diuretic) to remove fluid, it is still important to have less salt in your diet.  ° °Check with your primary care provider before drinking alcohol since it may affect the amount of fluid in your body and how your heart, kidneys, or liver work. °Sodium in Food °A low-sodium meal plan limits the sodium that you get from food and beverages to 1,500-2,000 milligrams (mg) per day. Salt is the main source of sodium. Read the nutrition label on the package to find out how much sodium is in one serving of a food.  °• Select foods with 140 milligrams (mg) of sodium or less per serving.  °• You may be able to eat one or two servings of foods with a little more than 140 milligrams (mg) of sodium if you are closely watching how much sodium you eat in a day.  °• Check the serving size on the label. The amount of sodium listed on the label shows the amount in one serving of the food. So, if you eat more than one serving, you will get more sodium than the amount listed. ° °Tips °Cutting Back on Sodium °• Eat more fresh foods.  °• Fresh fruits and vegetables are low in sodium, as well as frozen vegetables and fruits that have no added juices or sauces.  °• Fresh meats are lower in sodium than processed meats, such as bacon, sausage, and hotdogs.  °• Not all processed foods are unhealthy, but some processed foods  may have too much sodium.  °• Eat less salt at the table and when cooking. One of the ingredients in salt is sodium.  °• One teaspoon of table salt has 2,300 milligrams of sodium.  °• Leave the salt out of recipes for pasta, casseroles, and soups. °• Be a smart shopper.  °• Food packages that say “Salt-free”, sodium-free”, “very low sodium,” and “low sodium” have less than 140 milligrams of sodium per serving.  °• Beware of products identified as “Unsalted,” “No Salt Added,” “Reduced Sodium,” or “Lower Sodium.” These items may still be high in sodium. You should always check the nutrition label. °• Add flavors to your food without adding sodium.  °• Try lemon juice, lime juice, or vinegar.  °• Dry or fresh herbs add flavor.  °• Buy a sodium-free seasoning blend or make your own at home. °• You can purchase salt-free or sodium-free condiments like barbeque sauce in stores and online. Ask your registered dietitian nutritionist for recommendations and where to find them.  °•  °Eating in Restaurants °• Choose foods carefully when you eat outside your home. Restaurant foods can be very high in sodium. Many restaurants provide nutrition facts on their menus or their websites. If you cannot find that information, ask your server. Let your server know that you want your food   to be cooked without salt and that you would like your salad dressing and sauces to be served on the side.  °•  ° °• Foods Recommended °• Food Group • Foods Recommended  °• Grains • Bread, bagels, rolls without salted tops °Homemade bread made with reduced-sodium baking powder °Cold cereals, especially shredded wheat and puffed rice °Oats, grits, or cream of wheat °Pastas, quinoa, and rice °Popcorn, pretzels or crackers without salt °Corn tortillas  °• Protein Foods • Fresh meats and fish; turkey bacon (check the nutrition labels - make sure they are not packaged in a sodium solution) °Canned or packed tuna (no more than 4 ounces at 1 serving) °Beans  and peas °Soybeans) and tofu °Eggs °Nuts or nut butters without salt  °• Dairy • Milk or milk powder °Plant milks, such as rice and soy °Yogurt, including Greek yogurt °Small amounts of natural cheese (blocks of cheese) or reduced-sodium cheese can be used in moderation. (Swiss, ricotta, and fresh mozzarella cheese are lower in sodium than the others) °Cream Cheese °Low sodium cottage cheese  °• Vegetables • Fresh and frozen vegetables without added sauces or salt °Homemade soups (without salt) °Low-sodium, salt-free or sodium-free canned vegetables and soups  °• Fruit • Fresh and canned fruits °Dried fruits, such as raisins, cranberries, and prunes  °• Oils • Tub or liquid margarine, regular or without salt °Canola, corn, peanut, olive, safflower, or sunflower oils  °• Condiments • Fresh or dried herbs such as basil, bay leaf, dill, mustard (dry), nutmeg, paprika, parsley, rosemary, sage, or thyme.  °Low sodium ketchup °Vinegar  °Lemon or lime juice °Pepper, red pepper flakes, and cayenne. °Hot sauce contains sodium, but if you use just a drop or two, it will not add up to much.  °Salt-free or sodium-free seasoning mixes and marinades °Simple salad dressings: vinegar and oil  °•  °• Foods Not Recommended °• Food Group • Foods Not Recommended  °• Grains • Breads or crackers topped with salt °Cereals (hot/cold) with more than 300 mg sodium per serving °Biscuits, cornbread, and other “quick” breads prepared with baking soda °Pre-packaged bread crumbs °Seasoned and packaged rice and pasta mixes °Self-rising flours  °• Protein Foods • Cured meats: Bacon, ham, sausage, pepperoni and hot dogs °Canned meats (chili, vienna sausage, or sardines) °Smoked fish and meats °Frozen meals that have more than 600 mg of sodium per serving °Egg substitute (with added sodium)  °• Dairy • Buttermilk °Processed cheese spreads °Cottage cheese (1 cup may have over 500 mg of sodium; look for low-sodium.) °American or feta cheese °Shredded  Cheese has more sodium than blocks of cheese °String cheese  °• Vegetables • Canned vegetables (unless they are salt-free, sodium-free or low sodium) °Frozen vegetables with seasoning and sauces °Sauerkraut and pickled vegetables °Canned or dried soups (unless they are salt-free, sodium-free, or low sodium) °French fries and onion rings  °• Fruit • Dried fruits preserved with additives that have sodium  °• Oils • Salted butter or margarine, all types of olives  °• Condiments • Salt, sea salt, kosher salt, onion salt, and garlic salt °Seasoning mixes with salt °Bouillon cubes °Ketchup °Barbeque sauce and Worcestershire sauce unless low sodium °Soy sauce °Salsa, pickles, olives, relish °Salad dressings: ranch, blue cheese, Italian, and French.  °•  °• Low Sodium Sample 1-Day Menu  °• Breakfast • 1 cup cooked oatmeal  °• 1 slice whole wheat bread toast  °• 1 tablespoon peanut butter without salt  °• 1 banana  °•   1 cup 1% milk  °• Lunch • Tacos made with: 2 corn tortillas  °• ¼ cup black beans, low sodium  °• ½ cup roasted or grilled chicken (without skin)  °• ¼ avocado  °• Squeeze of lime juice  °• 1 cup salad greens  °• 1 tablespoon low-sodium salad dressing  °• ¼ cup strawberries  °• 1 orange  °• Afternoon Snack • 1/3 cup grapes  °• 6 ounces yogurt  °• Evening Meal • 3 ounces herb-baked fish  °• 1 baked potato  °• 2 teaspoons olive oil  °• ½ cup cooked carrots  °• 2 thick slices tomatoes on:  °• 2 lettuce leaves  °• 1 teaspoon olive oil  °• 1 teaspoon balsamic vinegar  °• 1 cup 1% milk  °• Evening Snack • 1 apple  °• ¼ cup almonds without salt  °•  °• Low-Sodium Vegetarian (Lacto-Ovo) Sample 1-Day Menu  °• Breakfast • 1 cup cooked oatmeal  °• 1 slice whole wheat toast  °• 1 tablespoon peanut butter without salt  °• 1 banana  °• 1 cup 1% milk  °• Lunch • Tacos made with: 2 corn tortillas  °• ¼ cup black beans, low sodium  °• ½ cup roasted or grilled chicken (without skin)  °• ¼ avocado  °• Squeeze of lime juice  °• 1  cup salad greens  °• 1 tablespoon low-sodium salad dressing  °• ¼ cup strawberries  °• 1 orange  °• Evening Meal • Stir fry made with: ½ cup tofu  °• 1 cup brown rice  °• ½ cup broccoli  °• ½ cup green beans  °• ½ cup peppers  °• ½ tablespoon peanut oil  °• 1 orange  °• 1 cup 1% milk  °• Evening Snack • 4 strips celery  °• 2 tablespoons hummus  °• 1 hard-boiled egg  °•  °• Low-Sodium Vegan Sample 1-Day Menu  °• Breakfast • 1 cup cooked oatmeal  °• 1 tablespoon peanut butter without salt  °• 1 cup blueberries  °• 1 cup soymilk fortified with calcium, vitamin B12, and vitamin D  °• Lunch • 1 small whole wheat pita  °• ½ cup cooked lentils  °• 2 tablespoons hummus  °• 4 carrot sticks  °• 1 medium apple  °• 1 cup soymilk fortified with calcium, vitamin B12, and vitamin D  °• Evening Meal • Stir fry made with: ½ cup tofu  °• 1 cup brown rice  °• ½ cup broccoli  °• ½ cup green beans  °• ½ cup peppers  °• ½ tablespoon peanut oil  °• 1 cup cantaloupe  °• Evening Snack • 1 cup soy yogurt  °• ¼ cup mixed nuts  °• Copyright 2020 © Academy of [employer]. All rights reserved °•  °• Sodium Free Flavoring Tips °•  °• When cooking, the following items may be used for flavoring instead of salt or seasonings that contain sodium. °• Remember: A little bit of spice goes a long way! Be careful not to overseason. °• Spice Blend Recipe (makes about ? cup) °• 5 teaspoons onion powder  °• 2½ teaspoons garlic powder  °• 2½ teaspoons paprika  °• 2½ teaspoon dry mustard  °• 1½ teaspoon crushed thyme leaves  °• ½ teaspoon white pepper  °• ¼ teaspoon celery seed °Food Item Flavorings  °Beef Basil, bay leaf, caraway, curry, dill, dry mustard, garlic, grape jelly, green pepper, mace, marjoram, mushrooms (fresh), nutmeg, onion or onion powder, parsley, pepper,   rosemary, sage  °Chicken Basil, cloves, cranberries, mace, mushrooms (fresh), nutmeg, oregano, paprika, parsley, pineapple, saffron, sage, savory, tarragon, thyme, tomato,  turmeric  °Egg Chervil, curry, dill, dry mustard, garlic or garlic powder, green pepper, jelly, mushrooms (fresh), nutmeg, onion powder, paprika, parsley, rosemary, tarragon, tomato  °Fish Basil, bay leaf, chervil, curry, dill, dry mustard, green pepper, lemon juice, marjoram, mushrooms (fresh), paprika, pepper, tarragon, tomato, turmeric  °Lamb Cloves, curry, dill, garlic or garlic powder, mace, mint, mint jelly, onion, oregano, parsley, pineapple, rosemary, tarragon, thyme  °Pork Applesauce, basil, caraway, chives, cloves, garlic or garlic powder, onion or onion powder, rosemary, thyme  °Veal Apricots, basil, bay leaf, currant jelly, curry, ginger, marjoram, mushrooms (fresh), oregano, paprika  °Vegetables Basil, dill, garlic or garlic powder, ginger, lemon juice, mace, marjoram, nutmeg, onion or onion powder, tarragon, tomato, sugar or sugar substitute, salt-free salad dressing, vinegar  °Desserts Allspice, anise, cinnamon, cloves, ginger, mace, nutmeg, vanilla extract, other extracts  ° °Copyright 2020 © Academy of [employer] and [employer]etics. All rights reserved ° °Fluid Restricted Nutrition Therapy  °You have been prescribed this diet because your condition affects how much fluid you can eat or drink. If your heart, liver, or kidneys aren't working properly, you may not be able to effectively eliminate fluids from the body and this may cause swelling (edema) in the legs, arms, and/or stomach. °Drink no more than 1.5  liters or 50 ounces or 6 1/4 cups of fluid per day.  °• You don't need to stop eating or drinking the same fluids you normally would, but you may need to eat or drink less than usual.  °• Your registered dietitian nutritionist will help you determine the correct amount of fluid to consume during the day °Breakfast Include fluids taken with medications  °Lunch Include fluids taken with medications  °Dinner Include fluids taken with medications  °Bedtime Snack Include fluids taken with medications   ° ° ° °Tips °What Are Fluids? ° °A fluid is anything that is liquid or anything that would melt if left at room temperature. You will need to count these foods and liquids--including any liquid used to take medication--as part of your daily fluid intake. Some examples are: °• Alcohol (drink only with your doctor's permission)  °• Coffee, tea, and other hot beverages  °• Gelatin (Jell-O)  °• Gravy  °• Ice cream, sherbet, sorbet  °• Ice cubes, ice chips  °• Milk, liquid creamer  °• Nutritional supplements  °• Popsicles  °• Vegetable and fruit juices; fluid in canned fruit  °• Watermelon  °• Yogurt  °• Soft drinks, lemonade, limeade  °• Soups  °• Syrup °How Do I Measure My Fluid Intake? °• Record your fluid intake daily.  °• Tip: Every day, each time you eat or drink fluids, pour water in the same amount into an empty container that can hold the same amount of fluids you are allowed daily. This may help you keep track of how much fluid you are taking in throughout the day.  °• To accurately keep track of how much liquid you take in, measure the size of the cups, glasses, and bowls you use. If you eat soup, measure how much of it is liquid and how much is solid (such as noodles, vegetables, meat). °Conversions for Measuring Fluid Intake  °Milliliters (mL) Liters (L) Ounces (oz) Cups (c)  °1000 1 32 4  °1200 1.2 40 5  °1500 1.5 50 6 1/4  °1800 1.8 60 7 1/2  °2000 2 67   8 1/3  °Tips to Reduce Your Thirst °• Chew gum or suck on hard candy.  °• Rinse or gargle with mouthwash. Do not swallow.  °• Ice chips or popsicles my help quench thirst, but this too needs to be calculated into the total restriction. Melt ice chips or cubes first to figure out how much fluid they produce (for example, experiment with melting ½ cup ice chips or 2 ice cubes).  °• Add a lemon wedge to your water.  °• Limit how much salt you take in. A high salt intake might make you thirstier.  °• Don't eat or drink all your allowed liquids at once. Space  your liquids out through the day.  °• Use small glasses and cups and sip slowly. If allowed, take your medications with fluids you eat or drink during a meal.  ° °Fluid-Restricted Nutrition Therapy Sample 1-Day Menu  °Breakfast 1 slice wheat toast  °1 tablespoon peanut butter  °1/2 cup yogurt (120 milliliters)  °1/2 cup blueberries  °1 cup milk (240 milliliters)   °Lunch 3 ounces sliced turkey  °2 slices whole wheat bread  °1/2 cup lettuce for sandwich  °2 slices tomato for sandwich  °1 ounce reduced-fat, reduced-sodium cheese  °1/2 cup fresh carrot sticks  °1 banana  °1 cup unsweetened tea (240 milliliters)   °Evening Meal 8 ounces soup (240 milliliters)  °3 ounces salmon  °1/2 cup quinoa  °1 cup green beans  °1 cup mixed greens salad  °1 tablespoon olive oil  °1 cup coffee (240 milliliters)  °Evening Snack 1/2 cup sliced peaches  °1/2 cup frozen yogurt (120 milliliters)  °1 cup water (240 milliliters)  °Copyright 2020 © Academy of [employer] and Dietetics. All rights reserved °

## 2019-09-16 NOTE — TOC Progression Note (Signed)
Transition of Care [facility]) - Progression Note    Patient Details  Name: Vanessa Williamson MRN: [id number] Date of Birth: [birth year]  Transition of Care [facility]) CM/SW [place], [place] Phone Number: [phone] 09/16/2019, 9:18 AM  Clinical Narrative:     Patient is set up with [employer] for oxygen needs. [employer] reports patient's insurance does not cover DME and that it will be around $200/month for patient to have home oxygen. Patient informed and agreeable to paying this amount per month for her home oxygen needs. [employer] made aware and will set patient up with home oxygen and bring oxygen to room for her discharge home today.   Expected Discharge Plan: [place] Barriers to Discharge: Continued Medical Work up  Expected Discharge Plan and Services Expected Discharge Plan: [place] arrangements for the past 2 months: Single Family Home                           HH Arranged: RN           Social Determinants of Health (SDOH) Interventions    Readmission Risk Interventions No flowsheet data found.

## 2019-09-16 NOTE — Plan of Care (Addendum)
Nutrition Education Note  RD consulted for nutrition education regarding CHF.  Case discussed with RN, who reports MD is requesting diet education on low sodium diet prior to discharge today.   Spoke with pt at bedside, who was pleasant and in good spirits today. She reports good appetite currently and PTA. PTA pt was generally consuming 2 meals per day (Lunch: chef salad OR lasagna and side salad and breadstick, or calamari; Dinner: baked potato, chicken, and salad). 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 Pt reports using mainly canned vegetables and microwaving her food. She seasons her food with pepper and garlic salt.   Focus on education was on healthier cooking preparation methods, how to decrease sodium in diet, and sodium free seasonings. Pt reports she is motivated to self-monitor her CHF as she does not want to return to the hospital.   RD provided "Low Sodium Nutrition Therapy", "Sodium Free Seasoning Tips", and "Fluid Restricted Nutrition Therapy" handouts from the Academy of Nutrition and Dietetics; also included on AVS/discharge summary. Reviewed patient's dietary recall. Provided examples on ways to decrease sodium intake in diet. Discouraged intake of processed foods and use of salt shaker. Encouraged fresh fruits and vegetables as well as whole grain sources of carbohydrates to maximize fiber intake.   RD discussed why it is important for patient to adhere to diet recommendations, and emphasized the role of fluids, foods to avoid, and importance of weighing self daily. Teach back method used.  Expect fair to good compliance.  Body mass index is 36.37 kg/m. Pt meets criteria for obesity, class II based on current BMI.  Current diet order is Heart Healthy, patient is consuming approximately 100% of meals at this time. Labs and medications  reviewed. No further nutrition interventions warranted at this time. RD contact information provided. If additional nutrition issues arise, please re-consult RD.   Sanaiya Welliver A. Jimmye Norman, RD, LDN, [place] Registered Dietitian II Certified Diabetes Care and Education Specialist Pager: [phone] After hours Pager: [phone]

## 2019-09-16 NOTE — TOC Progression Note (Signed)
Transition of Care [facility]) - Progression Note    Patient Details  Name: Vanessa Williamson MRN: [id number] Date of Birth: 1956-01-28  Transition of Care [facility]) CM/SW [place], [place] Phone Number: [phone] 09/16/2019, 2:36 PM  Clinical Narrative:     CSW informed by Malachy Mood with [facility] that they are able to accept patient for Home Health RN services.   Expected Discharge Plan: [place] Barriers to Discharge: Continued Medical Work up  Expected Discharge Plan and Services Expected Discharge Plan: [place] arrangements for the past 2 months: Single Family Home Expected Discharge Date: 09/16/19                         [facility] Arranged: RN           Social Determinants of Health (SDOH) Interventions    Readmission Risk Interventions No flowsheet data found.

## 2019-09-17 LAB — MPO/PR-3 (ANCA) ANTIBODIES
ANCA Proteinase 3: <3.5 U/mL (ref 0.0–3.5)
Myeloperoxidase Abs: <9 U/mL (ref 0.0–9.0)

## 2019-09-17 LAB — GLOMERULAR BASEMENT MEMBRANE ANTIBODIES: GBM Ab: 7 units (ref 0–20)

## 2019-09-21 ENCOUNTER — Encounter (HOSPITAL_COMMUNITY): Payer: No Typology Code available for payment source

## 2019-09-21 ENCOUNTER — Telehealth: Payer: Self-pay

## 2019-09-21 NOTE — Telephone Encounter (Signed)
Location of hospitalization: [place] Reason for hospitalization: Acute on Chronic Diastolic Heart Failure Date of discharge: 09/16/19 Date of first communication with patient: 09/21/19 Person contacting patient: Telford Nab B Current symptoms: doing okay Do you understand why you were in the Hospital: Yes Questions regarding discharge instructions: None Where were you discharged to: Home Medications reviewed: Yes Allergies reviewed: Yes Dietary changes reviewed: Yes. Discussed low fat and low salt diet.  Referals reviewed: NA Activities of Daily Living: Able to with mild limitations Any transportation issues/concerns: None Any patient concerns: None Confirmed importance & date/time of Follow up appt: Yes 09/27/19 @ 2pm with Miquel Dunn Confirmed with patient if condition begins to worsen call. Pt was given the office number and encouraged to call back with questions or concerns: Yes

## 2019-09-21 NOTE — Telephone Encounter (Signed)
-----   Message from Adrian Prows, MD sent at 09/16/2019 12:05 PM EDT ----- Regarding: TOC 7 day TOC visit with Vanessa Williamson set please reconcile.

## 2019-09-27 ENCOUNTER — Other Ambulatory Visit: Payer: Self-pay

## 2019-09-27 ENCOUNTER — Ambulatory Visit (INDEPENDENT_AMBULATORY_CARE_PROVIDER_SITE_OTHER): Payer: No Typology Code available for payment source | Admitting: Cardiology

## 2019-09-27 ENCOUNTER — Encounter: Payer: Self-pay | Admitting: Cardiology

## 2019-09-27 VITALS — BP 155/83 | HR 77 | Ht 64.0 in | Wt 208.0 lb

## 2019-09-27 DIAGNOSIS — I2781 Cor pulmonale (chronic): Secondary | ICD-10-CM | POA: Diagnosis not present

## 2019-09-27 DIAGNOSIS — N183 Chronic kidney disease, stage 3 unspecified: Secondary | ICD-10-CM | POA: Diagnosis not present

## 2019-09-27 DIAGNOSIS — I5032 Chronic diastolic (congestive) heart failure: Secondary | ICD-10-CM | POA: Diagnosis not present

## 2019-09-27 DIAGNOSIS — R700 Elevated erythrocyte sedimentation rate: Secondary | ICD-10-CM

## 2019-09-27 DIAGNOSIS — I272 Pulmonary hypertension, unspecified: Secondary | ICD-10-CM

## 2019-09-27 NOTE — Progress Notes (Signed)
Primary Physician:  Patient, No Pcp Per   Patient ID: Vanessa Williamson, female    DOB: 1956-03-18, 63 y.o.   MRN: [id number]  Subjective:    Chief Complaint  Patient presents with   Congestive Heart Failure   Hypertension   Follow-up    HPI: Vanessa Williamson  is a 63 y.o. female  With hypertension, ongoing tobacco use, CKD stage 3, iron deficiency anemia, recently evaluated by [place] for pulmonary hypertension noted on echocardiogram.    Pulmonary hypertension with RV failure was felt to be potentially related to diastolic dysfunction from uncontrolled hypertension. She underwent lexiscan nuclear stress test in Sept 2019, considered high risk study in view of reduced LVEF and small perfusion abnormality. Due to worsening symptoms, she was directly admitted to the hospital on 09/10/19 for acute exacerbation of chronic diastolic heart failure, left ventricle and acute right ventricular on chronic right ventricular systolic heart failure due to cor pulmonale, she underwent right and left heart catheterization revealing moderate to severe pulmonary hypertension, findings are suggestive of primary PAH.  She had low pulmonary capillary wedge pressure and LVEDP.  Preserved cardiac output and cardiac index.  ARB was discontinued due to acute renal insufficiency with diuresis, however this did improve with dobutamine infusion.  Serology was sent, VQ scan was negative for PE, on the day of discharge she was still slightly fluid overloaded but stable for discharge.  She was hypoxemic hence home oxygen therapy was prescribed. Lasix was changed to Torsemide. 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  She has been feeling some better since discharge. Weight has been stable, but does notice some abdominal distension the last few days.   Prior to discharge, had autoimmune workup that showed elevated Sed rate and Sjorgens was elevated.   Blood  pressure has been stable. Valsartan was discontinued as well as Isosorbide. No hyperlipidemia or diabetes. She admits to making some changes to her diet.   She has not smoked since she was admitted to the hospital on 09/10/19!  Past Medical History:  Diagnosis Date   Anemia    Cancer ([place])    cervical  1970s    Chronic kidney disease    COPD (chronic obstructive pulmonary disease) (HCC)    GERD (gastroesophageal reflux disease)    Hypertension     Past Surgical History:  Procedure Laterality Date   BREAST BIOPSY Left    benign   CERVIX LESION DESTRUCTION     frozen   COLONOSCOPY  03/09/2008   EYE SURGERY     laser   FRACTURE SURGERY     HUMERUS FRACTURE SURGERY     left  2005   RIGHT/LEFT HEART CATH AND CORONARY ANGIOGRAPHY N/A 09/14/2019   Procedure: RIGHT/LEFT HEART CATH AND CORONARY ANGIOGRAPHY;  Surgeon: Adrian Prows, MD;  Location: [facility];  Service: Cardiovascular;  Laterality: N/A;   VENTRAL HERNIA REPAIR N/A 10/16/2017   Procedure: OPEN VENTRAL HERNIA  REPAIR ERAS PATHWAY;  Surgeon: Alphonsa Overall, MD;  Location: [facility];  Service: General;  Laterality: N/A;    Social History   Socioeconomic History   Marital status: Single    Spouse name: Not on file   Number of children: 0   Years of education: Not on file   Highest education level: Not on file  Occupational History   Occupation: IT    Employer: [employer]  resource strain: Not on file   Food insecurity    Worry: Not on file    Inability: Not on file   Transportation needs    Medical: Not on file    Non-medical: Not on file  Tobacco Use   Smoking status: Former Smoker    Packs/day: 0.25    Types: Cigarettes    Quit date: 08/2019    Years since quitting: 0.0   Smokeless tobacco: Never Used   Tobacco comment: been smoking since age 5  Substance and Sexual Activity   Alcohol use: Yes    Comment: rarely   Drug use: No    Comment: 2005     Sexual activity: Never    Birth control/protection: None  Lifestyle   Physical activity    Days per week: Not on file    Minutes per session: Not on file   Stress: Not on file  Relationships   Social connections    Talks on phone: Not on file    Gets together: Not on file    Attends religious service: Not on file    Active member of club or organization: Not on file    Attends meetings of clubs or organizations: Not on file    Relationship status: Not on file   Intimate partner violence    Fear of current or ex partner: Not on file    Emotionally abused: Not on file    Physically abused: Not on file    Forced sexual activity: Not on file  Other Topics Concern   Not on file  Social History Narrative   Single. Education: [employer].    Review of Systems  Constitution: Negative for decreased appetite, malaise/fatigue, weight gain and weight loss.  Eyes: Negative for visual disturbance.  Cardiovascular: Positive for dyspnea on exertion and leg swelling. Negative for chest pain, claudication, orthopnea, palpitations and syncope.  Respiratory: Negative for hemoptysis and wheezing.   Endocrine: Negative for cold intolerance and heat intolerance.  Hematologic/Lymphatic: Does not bruise/bleed easily.  Skin: Negative for nail changes.  Musculoskeletal: Negative for muscle weakness and myalgias.  Gastrointestinal: Negative for abdominal pain, change in bowel habit, nausea and vomiting.  Neurological: Negative for difficulty with concentration, dizziness, focal weakness and headaches.  Psychiatric/Behavioral: Negative for altered mental status and suicidal ideas.  All other systems reviewed and are negative.     Objective:  Blood pressure (!) 155/83, pulse 77, height 5\' 4"  (1.626 m), weight 208 lb (94.3 kg), SpO2 90 %. Body mass index is 35.7 kg/m.    Physical Exam  Constitutional: She is oriented to person, place, and time. Vital signs are normal. She appears well-developed  and well-nourished.  HENT:  Head: Normocephalic and atraumatic.  Neck: Normal range of motion. JVD present.  Cardiovascular: Normal rate, regular rhythm, normal heart sounds and intact distal pulses.  Pulses:      Femoral pulses are 0 on the right side and 0 on the left side.      Popliteal pulses are 0 on the right side and 0 on the left side.       Dorsalis pedis pulses are 0 on the right side and 0 on the left side.       Posterior tibial pulses are 0 on the right side and 0 on the left side.  Pulses difficulty to feel due to bodily habitus  Pulmonary/Chest: Effort normal and breath sounds normal. No accessory muscle usage. No respiratory distress.  Abdominal: Soft. Bowel sounds are normal.  There is no hepatosplenomegaly.  Umbilical hernia repair  Musculoskeletal: Normal range of motion.  Neurological: She is alert and oriented to person, place, and time.  Skin: Skin is warm and dry.  Vitals reviewed.  Radiology: No results found.  Laboratory examination:    CMP Latest Ref Rng & Units 09/15/2019 09/14/2019 09/14/2019  Glucose 70 - 99 mg/dL 97 - -  BUN 8 - 23 mg/dL 35(H) - -  Creatinine 0.44 - 1.00 mg/dL 1.18(H) - -  Sodium 135 - 145 mmol/L 142 145 146(H)  Potassium 3.5 - 5.1 mmol/L 4.5 4.8 4.8  Chloride 98 - 111 mmol/L 112(H) - -  CO2 22 - 32 mmol/L 26 - -  Calcium 8.9 - 10.3 mg/dL 8.4(L) - -  Total Protein 6.5 - 8.1 g/dL - - -  Total Bilirubin 0.3 - 1.2 mg/dL - - -  Alkaline Phos 38 - 126 U/L - - -  AST 15 - 41 U/L - - -  ALT 0 - 44 U/L - - -   CBC Latest Ref Rng & Units 09/14/2019 09/14/2019 09/10/2019  WBC 4.0 - 10.5 K/uL - - 3.5(L)  Hemoglobin 12.0 - 15.0 g/dL 7.1(L) 7.1(L) 9.6(L)  Hematocrit 36.0 - 46.0 % 21.0(L) 21.0(L) 28.8(L)  Platelets 150 - 400 K/uL - - 137(L)   Lipid Panel     Component Value Date/Time   CHOL 157 07/20/2018 1452   TRIG 98 07/20/2018 1452   HDL 42 07/20/2018 1452   CHOLHDL 3.7 07/20/2018 1452   CHOLHDL 4.3 12/10/2013 0856   VLDL 18  12/10/2013 0856   LDLCALC 95 07/20/2018 1452   HEMOGLOBIN A1C No results found for: HGBA1C, MPG TSH No results for input(s): TSH in the last 8760 hours.  PRN Meds:. There are no discontinued medications. Current Meds  Medication Sig   acetaminophen (TYLENOL) 325 MG tablet Take 2 tablets (650 mg total) by mouth every 4 (four) hours as needed for headache or mild pain.   amLODipine (NORVASC) 5 MG tablet Take 1 tablet (5 mg total) by mouth daily.   aspirin EC 81 MG EC tablet Take 1 tablet (81 mg total) by mouth daily.   budesonide (PULMICORT) 0.25 MG/2ML nebulizer solution Take 2 mLs (0.25 mg total) by nebulization 2 (two) times daily.   hydrALAZINE (APRESOLINE) 25 MG tablet Take 1 tablet (25 mg total) by mouth 3 (three) times daily.   loratadine (CLARITIN) 10 MG tablet Take 10 mg by mouth daily.   metoprolol succinate (TOPROL-XL) 25 MG 24 hr tablet Take 1 tablet (25 mg total) by mouth daily.   potassium chloride (K-DUR) 10 MEQ tablet Take 1 tablet (10 mEq total) by mouth 2 (two) times daily.   sildenafil (REVATIO) 20 MG tablet Take 1 tablet (20 mg total) by mouth 3 (three) times daily.   torsemide (DEMADEX) 20 MG tablet Take 1 tablet (20 mg total) by mouth 2 (two) times daily with a meal.    Cardiac Studies:   R + L Heart Catheterization 09/14/19 Very mild to moderate coronary artery disease especially involving proximal LAD about 30 to 40% stenosis. Right dominant circulation, minimal disease in the right. Large ramus intermediate and circumflex coronary artery with mild disease. Normal LVEF at 50%. Moderate to severe pulmonary hypertension. Low wedge pressure and low LVEDP, findings are consistent with primary pulmonary hypertension.  RA 9/8, mean 7 mmHg; RV 61/6, EDP 9 mmHg; PA 64/23, mean 39 mmHg, PA saturation 57%. PW 12/10, mean 10 mmHg. LV 164/8, EDP 9 mmHg.  Aorta 160/72, mean 105 mmHg. Aortic saturation 91%. QP/QS 1.00. CO 6.03, CI 3.0.  Recommendation:Patient will need therapy for primary pulmonary hypertension.  Lexiscan Sestamibi Stress Test 09/06/2019: 1. Stress EKG is non-diagnostic, as this is pharmacological stress test. 2. The LV is dilated in both rest and stress images with LV EDV of 156 mL. There is very small decrease in uptake in the anterior and apical lateral wall suggestive of very small sized ischemia. Stress LV EF is severely dysfunctional 30% with global hypokinesis.  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 However, balanced ischemia and multivessel CAD cannot be excluded.  3. In addition, the right ventricle appears to be severely dilated.  4. High risk study. No previous exam available for comparison.  Echo 07/12/2019:  1. The left ventricle has low normal systolic function, with an ejection fraction of 50-55%. The cavity size was mildly dilated. Left ventricular diastolic Doppler parameters are consistent with impaired relaxation. There is right ventricular volume and  pressure overload.  2. The right ventricle has moderately reduced systolic function. The cavity was mildly enlarged. There is no increase in right ventricular wall thickness.  3. Left atrial size was moderately dilated.  4. Right atrial size was moderately dilated.  5. Mild thickening of the mitral valve leaflet. Mitral valve regurgitation is moderate to severe by color flow Doppler.  6. The MV is moderately thickened. There is at least moderate to severe eccentric MR associated with pulmonary HTN and RV pressure/volume overlaod. RVSP 65-7mmHG. Would suggest TEE to further evaluate.  7. The aortic valve is tricuspid. Mild calcification of the aortic valve.  8. Pulmonic valve regurgitation is moderate by color flow Doppler.  9. The aorta is normal in size and structure. 10. The inferior vena cava was dilated in size with >50% respiratory variability.  Assessment:      ICD-10-CM   1. Pulmonary hypertension (HCC)  I27.20   2. Cor pulmonale, chronic (HCC)  I27.81   3. Chronic heart failure with preserved ejection fraction (HCC)  I50.32   4. Stage 3 chronic kidney disease, unspecified whether stage 3a or 3b CKD  N18.30    EKG 08/09/2019: Normal sinus rhythm at 78 bpm, 1 PVC, normal axis, PRWP, T wave inversion in anteroseptal leads, cannot exclude ischemia. Abnormal EKG.  Recommendations:   Patient is here for post-hospital follow up. She is doing well, with some improvement in her symptoms. Continue with home O2 therapy. Weight has remained stable as well as leg edema.   I suspect patient has WHO group 1 pulmonary hypertension, potentially related to connective tissue disorder as Sjorgens serology and sed rate were elevated. Will place referral to Rheumatology for further evaluation. She is to be evaluated by pulmonary in the next few weeks. She is on Revatio for pulmonary hypertension management, she will need prostacyclin analog as well as ERA therpay. Will start Uptravi and also Opsumit therapy. Continue with other present medications. Encouraged her to continue with diet modifications to help with her heart failure and weight.   Noted to have mild CAD by coronary angiogram. Continue with risk factor modificaiton. CKD has remained stable and had improved at discharge. She will continue to follow up with Nephrology for management.   I have congratulated her on quitting smoking, and urged her to continue with this. We will plan to see her back in 3 weeks for close follow up on pulmonary hypertension and cor pulmonale.   *I have  discussed this case with Dr. Einar Gip and he personally examined the patient and participated in formulating the plan.*   Miquel Dunn, MSN, APRN, FNP-C [facility]. [place] Office: [phone] Fax: [phone]

## 2019-09-28 ENCOUNTER — Encounter (HOSPITAL_COMMUNITY): Payer: No Typology Code available for payment source

## 2019-09-29 ENCOUNTER — Other Ambulatory Visit: Payer: Self-pay

## 2019-09-29 ENCOUNTER — Encounter: Payer: Self-pay | Admitting: Primary Care

## 2019-09-29 ENCOUNTER — Ambulatory Visit (INDEPENDENT_AMBULATORY_CARE_PROVIDER_SITE_OTHER): Payer: No Typology Code available for payment source | Admitting: Primary Care

## 2019-09-29 DIAGNOSIS — R0600 Dyspnea, unspecified: Secondary | ICD-10-CM

## 2019-09-29 DIAGNOSIS — R0683 Snoring: Secondary | ICD-10-CM

## 2019-09-29 NOTE — Patient Instructions (Signed)
It was a pleasure speaking with you today  Orders: In-lab sleep study re: snoring Pulmonary function testing   Referral: DME company for new nebulizer machine  Follow-up 4 weeks with new [facility] pulmonary provider (seen in-patient by MR)

## 2019-09-29 NOTE — Progress Notes (Signed)
Virtual Visit via Telephone Note  I connected with Vanessa Williamson on 09/29/19 at 10:15 AM EDT by telephone and verified that I am speaking with the correct person using two identifiers.  Location: Patient: Home Provider: Office   I discussed the limitations, risks, security and privacy concerns of performing an evaluation and management service by telephone and the availability of in person appointments. I also discussed with the patient that there may be a patient responsible charge related to this service. The patient expressed understanding and agreed to proceed.  History of Present Illness: 63 year old female, former smoker quit 08/24/19. PMH significant for HTN, heart failure with preserved ejection fraction, pulmonary hypertension, COPD, GERD, obesity. Seen in-patient by Dr. Chase Caller on 09/13/19 for acute on chronic hypoxic respiratory failure, acute exacerbation chronic diastolic heart failure.  Hospitalization course 09/18-9/24: Patient was admitted to the hospital with worsening shortness of breath the past 6 to 8 months.  She was in her cardiologist office and was noted to have an oxygen saturation of 85% while walking in the hallway.  On admission she was diuresed with improvement in leg swelling but still required oxygen. She was started on dobutamine to improve urine output. Diuresed total of 5.1L. Hypoxemia is new to this admission.  No previous history of COPD. CT showed emphysema +CHF. Doubt ILD. Right and left heart catheterization revealed moderate to severe pulmonary hypertension, findings suggestive of primary PAH suspect WHO GROUP I (doubt II). Started on Revatio and also started on Prostacyclin. Lasix changed to Demandex (Torsemide) 20mg  twice daily.   09/29/2019 Patient contacted today for tele-visit hospital follow-up.  States that she is doing pretty good, takes her time moving around. She would like to start working from home. Breathing better with oxygen. She is on 3L. She  saw cardiology on 10/5 and was started on Uptravi and OPSUMIT but is awaiting medication. She was also referred to Rheumatology. Continues to take her torsemide twice daily and sildenafil 3 times daily.  Reports her weight is 206lbs today (211 discharge weight).  Reports loud snoring and daytime fatigue. She is not aware of apneic breathing and does not wake up short of breath. Epworth score 10 . She needs pulmonary function test and a new nebulizer machine.   Observations/Objective:  - No significant shortness of breath, wheezing or cough  Assessment and Plan:  COPD - No pulmonary function testing on file - Previous smoker quit last month - Continue budesonide nebulizer twice daily - Referral for new nebulizer machine - Needs PFTs   Pulmonary hypertension - Suspected WHO group 1 - Started on Revatio and prostacyclin receptor agnoist while in-patient  - Saw Cardiology on 10/5, added Uptravi and Opsumit   Snoring: - Epworth 10 - Needs split-night sleep study suspected sleep apnea  Follow Up Instructions:   - FU in 4 weeks with new [facility] pulmonary provider (hospital consult MR)  I discussed the assessment and treatment plan with the patient. The patient was provided an opportunity to ask questions and all were answered. The patient agreed with the plan and demonstrated an understanding of the instructions.   The patient was advised to call back or seek an in-person evaluation if the symptoms worsen or if the condition fails to improve as anticipated.  I provided 25 minutes of non-face-to-face time during this encounter.   Martyn Ehrich, NP

## 2019-09-29 NOTE — Progress Notes (Signed)
Reviewed and agree with assessment/plan.   Darian Cansler, MD [facility] 12/18/2016, 12:24 PM Pager:  [phone]  

## 2019-10-04 MED ORDER — UPTRAVI TITRATION 200 & 800 MCG PO TBPK: 200.0000 ug | ORAL_TABLET | Freq: Two times a day (BID) | ORAL | 1 refills | Status: DC

## 2019-10-04 MED ORDER — OPSUMIT 10 MG PO TABS: 10.0000 mg | ORAL_TABLET | Freq: Every day | ORAL | 1 refills | Status: DC

## 2019-10-09 ENCOUNTER — Other Ambulatory Visit: Payer: Self-pay | Admitting: Cardiology

## 2019-10-09 ENCOUNTER — Other Ambulatory Visit (HOSPITAL_COMMUNITY): Payer: No Typology Code available for payment source

## 2019-10-11 ENCOUNTER — Institutional Professional Consult (permissible substitution): Payer: No Typology Code available for payment source | Admitting: Pulmonary Disease

## 2019-10-13 ENCOUNTER — Ambulatory Visit (HOSPITAL_BASED_OUTPATIENT_CLINIC_OR_DEPARTMENT_OTHER): Payer: No Typology Code available for payment source | Admitting: Pulmonary Disease

## 2019-10-18 ENCOUNTER — Ambulatory Visit: Payer: No Typology Code available for payment source | Admitting: Cardiology

## 2019-10-27 ENCOUNTER — Ambulatory Visit: Payer: No Typology Code available for payment source | Admitting: Cardiology

## 2019-10-28 ENCOUNTER — Encounter: Payer: Self-pay | Admitting: Cardiology

## 2019-10-28 ENCOUNTER — Ambulatory Visit (INDEPENDENT_AMBULATORY_CARE_PROVIDER_SITE_OTHER): Payer: No Typology Code available for payment source | Admitting: Cardiology

## 2019-10-28 ENCOUNTER — Other Ambulatory Visit: Payer: Self-pay

## 2019-10-28 VITALS — BP 121/66 | HR 76 | Temp 96.2°F | Ht 64.0 in | Wt 202.3 lb

## 2019-10-28 DIAGNOSIS — I5082 Biventricular heart failure: Secondary | ICD-10-CM

## 2019-10-28 DIAGNOSIS — I5033 Acute on chronic diastolic (congestive) heart failure: Secondary | ICD-10-CM | POA: Diagnosis not present

## 2019-10-28 DIAGNOSIS — I129 Hypertensive chronic kidney disease with stage 1 through stage 4 chronic kidney disease, or unspecified chronic kidney disease: Secondary | ICD-10-CM

## 2019-10-28 DIAGNOSIS — I272 Pulmonary hypertension, unspecified: Secondary | ICD-10-CM

## 2019-10-28 DIAGNOSIS — N183 Chronic kidney disease, stage 3 unspecified: Secondary | ICD-10-CM

## 2019-10-28 MED ORDER — METOPROLOL SUCCINATE ER 25 MG PO TB24: 25.0000 mg | ORAL_TABLET | Freq: Every day | ORAL | 3 refills | Status: DC

## 2019-10-28 MED ORDER — SPIRONOLACTONE 50 MG PO TABS: 50.0000 mg | ORAL_TABLET | Freq: Every day | ORAL | 2 refills | Status: DC

## 2019-10-28 NOTE — Progress Notes (Signed)
Primary Physician:  Patient, No Pcp Per   Patient ID: Vanessa Williamson, female    DOB: 11/12/1956, 63 y.o.   MRN: [id number]  Subjective:    Chief Complaint  Patient presents with  . pulmonary hypertension  . Follow-up    3 week    HPI: Vanessa Williamson  is a 63 y.o. female  With hypertension,  tobacco use, quit since 09/10/19, CKD stage 3, iron deficiency anemia, recently evaluated by [place] for pulmonary hypertension noted on echocardiogram.  No hyperlipidemia or diabetes.  Pulmonary hypertension with RV failure was felt to be potentially related to diastolic dysfunction from uncontrolled hypertension, however due to acute decompensated CHF admitted to the hospital on 09/10/19 for acute exacerbation of chronic diastolic heart failure, left ventricle and acute right ventricular on chronic right ventricular systolic heart failure due to cor pulmonale, she underwent right and left heart catheterization after diuresis revealing moderate to severe pulmonary hypertension, findings are suggestive of primary PAH.  She had low pulmonary capillary wedge pressure and LVEDP.  Preserved cardiac output and cardiac index.  ARB was discontinued due to acute renal insufficiency with diuresis, however this did improve with dobutamine infusion.  Serology was sent, VQ scan was negative for PE. She was hypoxemic hence home oxygen therapy was prescribed. Lasix was changed to Torsemide. 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  She presents to the office for routine f/u, but over the past 2 weeks has started to retain fluid again and worsening dyspnea.  She was started on OPSUMIT and Uptravi at her last office visit, but it appears that she is only taking Opsumit.  States that she has not received Uptravi yet.  Past Medical History:  Diagnosis Date  . Anemia   . Cancer ([place])    cervical  1970s   . Chronic kidney disease   . COPD (chronic obstructive  pulmonary disease) ([place])   . GERD (gastroesophageal reflux disease)   . Hypertension     Past Surgical History:  Procedure Laterality Date  . BREAST BIOPSY Left    benign  . CERVIX LESION DESTRUCTION     frozen  . COLONOSCOPY  03/09/2008  . EYE SURGERY     laser  . FRACTURE SURGERY    . HUMERUS FRACTURE SURGERY     left  2005  . RIGHT/LEFT HEART CATH AND CORONARY ANGIOGRAPHY N/A 09/14/2019   Procedure: RIGHT/LEFT HEART CATH AND CORONARY ANGIOGRAPHY;  Surgeon: Adrian Prows, MD;  Location: [facility];  Service: Cardiovascular;  Laterality: N/A;  . VENTRAL HERNIA REPAIR N/A 10/16/2017   Procedure: [place];  Surgeon: Alphonsa Overall, MD;  Location: [facility];  Service: General;  Laterality: N/A;    Social History   Socioeconomic History  . Marital status: Single    Spouse name: Not on file  . Number of children: 0  . Years of education: Not on file  . Highest education level: Not on file  Occupational History  . Occupation: Building control surveyor: [employer]  . Financial resource strain: Not on file  . Food insecurity    Worry: Not on file    Inability: Not on file  . Transportation needs    Medical: Not on file    Non-medical: Not on file  Tobacco Use  . Smoking status: Former Smoker    Packs/day: 0.25  Types: Cigarettes    Quit date: 08/2019    Years since quitting: 0.1  . Smokeless tobacco: Never Used  . Tobacco comment: been smoking since age 74  Substance and Sexual Activity  . Alcohol use: Yes    Comment: rarely  . Drug use: No    Comment: 2005  . Sexual activity: Never    Birth control/protection: None  Lifestyle  . Physical activity    Days per week: Not on file    Minutes per session: Not on file  . Stress: Not on file  Relationships  . Social Herbalist on phone: Not on file    Gets together: Not on file    Attends religious service: Not on file    Active member of club or organization: Not  on file    Attends meetings of clubs or organizations: Not on file    Relationship status: Not on file  . Intimate partner violence    Fear of current or ex partner: Not on file    Emotionally abused: Not on file    Physically abused: Not on file    Forced sexual activity: Not on file  Other Topics Concern  . Not on file  Social History Narrative   Single. Education: [employer].    Review of Systems  Constitution: Positive for malaise/fatigue. Negative for decreased appetite, weight gain and weight loss.  Eyes: Negative for visual disturbance.  Cardiovascular: Positive for dyspnea on exertion and leg swelling. Negative for chest pain, claudication, orthopnea, palpitations and syncope.  Respiratory: Negative for hemoptysis and wheezing.   Endocrine: Negative for cold intolerance and heat intolerance.  Hematologic/Lymphatic: Does not bruise/bleed easily.  Skin: Negative for nail changes.  Musculoskeletal: Negative for muscle weakness and myalgias.  Gastrointestinal: Positive for bloating. Negative for abdominal pain, change in bowel habit, nausea and vomiting.  Neurological: Negative for difficulty with concentration, dizziness, focal weakness and headaches.  Psychiatric/Behavioral: Negative for altered mental status and suicidal ideas.  All other systems reviewed and are negative.  Objective:  Blood pressure 121/66, pulse 76, temperature (!) 96.2 F (35.7 C), height 5\' 4"  (1.626 m), weight 202 lb 4.8 oz (91.8 kg), SpO2 94 %. Body mass index is 34.72 kg/m.    Physical Exam  Constitutional: She is oriented to person, place, and time. Vital signs are normal.  She is moderately built and well-nourished, appears ill-looking, mild respiratory distress present.  HENT:  Head: Normocephalic and atraumatic.  Neck: Normal range of motion. JVD present.  Cardiovascular: Normal rate, regular rhythm, normal heart sounds and intact distal pulses.  Pulses:      Femoral pulses are 0 on the right  side and 0 on the left side.      Popliteal pulses are 0 on the right side and 0 on the left side.       Dorsalis pedis pulses are 0 on the right side and 0 on the left side.       Posterior tibial pulses are 0 on the right side and 0 on the left side.  2-3 + bilateral pitting edema. No tenderness  Pulmonary/Chest: Effort normal and breath sounds normal. No accessory muscle usage. No respiratory distress.  Abdominal: Soft. Bowel sounds are normal. She exhibits distension. There is no hepatosplenomegaly.  Umbilical hernia repair  Musculoskeletal: Normal range of motion.  Neurological: She is alert and oriented to person, place, and time.  Skin: Skin is warm and dry.    Laboratory examination:  CMP Latest Ref Rng & Units 10/29/2019 09/15/2019 09/14/2019  Glucose 70 - 99 mg/dL 95 97 -  BUN 8 - 23 mg/dL 94(H) 35(H) -  Creatinine 0.44 - 1.00 mg/dL 3.12(H) 1.18(H) -  Sodium 135 - 145 mmol/L 139 142 145  Potassium 3.5 - 5.1 mmol/L 6.0(H) 4.5 4.8  Chloride 98 - 111 mmol/L 113(H) 112(H) -  CO2 22 - 32 mmol/L 18(L) 26 -  Calcium 8.9 - 10.3 mg/dL 8.8(L) 8.4(L) -  Total Protein 6.5 - 8.1 g/dL - - -  Total Bilirubin 0.3 - 1.2 mg/dL - - -  Alkaline Phos 38 - 126 U/L - - -  AST 15 - 41 U/L - - -  ALT 0 - 44 U/L - - -   CBC Latest Ref Rng & Units 10/29/2019 09/14/2019 09/14/2019  WBC 4.0 - 10.5 K/uL 3.9(L) - -  Hemoglobin 12.0 - 15.0 g/dL 9.7(L) 7.1(L) 7.1(L)  Hematocrit 36.0 - 46.0 % 30.0(L) 21.0(L) 21.0(L)  Platelets 150 - 400 K/uL 173 - -   Lipid Panel     Component Value Date/Time   CHOL 157 07/20/2018 1452   TRIG 98 07/20/2018 1452   HDL 42 07/20/2018 1452   CHOLHDL 3.7 07/20/2018 1452   CHOLHDL 4.3 12/10/2013 0856   VLDL 18 12/10/2013 0856   LDLCALC 95 07/20/2018 1452   HEMOGLOBIN A1C No results found for: HGBA1C, MPG TSH No results for input(s): TSH in the last 8760 hours.  PRN Meds:. Medications Discontinued During This Encounter  Medication Reason  . Selexipag (UPTRAVI)  200 & 800 MCG TBPK Error  . KLOR-CON M10 10 MEQ tablet Discontinued by provider  . metoprolol succinate (TOPROL-XL) 25 MG 24 hr tablet Reorder   Current Meds  Medication Sig  . acetaminophen (TYLENOL) 325 MG tablet Take 2 tablets (650 mg total) by mouth every 4 (four) hours as needed for headache or mild pain.  Marland Kitchen amLODipine (NORVASC) 5 MG tablet TAKE 1 TABLET BY MOUTH EVERY DAY  . aspirin EC 81 MG EC tablet Take 1 tablet (81 mg total) by mouth daily.  . hydrALAZINE (APRESOLINE) 25 MG tablet Take 1 tablet (25 mg total) by mouth 3 (three) times daily.  Marland Kitchen loratadine (CLARITIN) 10 MG tablet Take 10 mg by mouth daily.  . macitentan (OPSUMIT) 10 MG tablet Take 1 tablet (10 mg total) by mouth daily.  . metoprolol succinate (TOPROL-XL) 25 MG 24 hr tablet Take 1 tablet (25 mg total) by mouth daily.  . sildenafil (REVATIO) 20 MG tablet TAKE 1 TABLET BY MOUTH THREE TIMES A DAY  . torsemide (DEMADEX) 20 MG tablet TAKE 1 TABLET (20 MG TOTAL) BY MOUTH 2 (TWO) TIMES DAILY WITH A MEAL.  . [DISCONTINUED] KLOR-CON M10 10 MEQ tablet TAKE 1 TABLET (10 MEQ TOTAL) BY MOUTH 2 (TWO) TIMES DAILY.  . [DISCONTINUED] metoprolol succinate (TOPROL-XL) 25 MG 24 hr tablet TAKE 1 TABLET BY MOUTH EVERY DAY   Radiology:    Ultrasound of the abdomen limited right upper quadrant 09/15/2019: Subtle nodularity to the liver surface suggests early cirrhosis. No focal hepatic abnormality. Perihepatic ascites, right pleural effusion.  High-resolution CT scan 09/13/2019:  1. New cardiac enlargement with fairly diffuse but basilar predominant septal thickening and trace left pleural effusion. Findings are likely due to congestive heart failure. An atypical/viral pneumonia cannot be definitively excluded. No evidence of fibrotic interstitial lung disease. 2. Extensive mediastinal and axillary adenopathy may be reactive. A lymphoproliferative disorder is not excluded. 3. Air trapping is indicative of small  airways disease. 4.   Emphysema (ICD10-J43.9). 5. Aortic atherosclerosis (ICD10-170.0). Coronary artery calcification. 6. Enlarged pulmonic trunk, indicative of pulmonary arterial Hypertension.  CXR PA-LAT 09/15/2019: New right PICC is in place with the tip projecting in the lower superior vena cava. The lungs are emphysematous. There is cardiomegaly and interstitial pulmonary edema. Tiny bilateral pleural effusions are seen. Atherosclerosis noted. No pneumothorax. No acute bony abnormality. IMPRESSION: Tip of right PICC projects in the lower superior vena cava. No change in cardiomegaly and interstitial edema. Trace pleural effusions noted. Emphysema. Atherosclerosis.  VQ scan 09/15/2019: Mildly heterogeneous perfusion, without wedge-shaped defects suggestive of pulmonary embolism. Ventilation imaging was not performed. Corresponding chest radiographs suggest mild interstitial edema with right lower lobe atelectasis and small right pleural effusion. IMPRESSION: No convincing findings to suggest pulmonary embolism.  Cardiac Studies:   Echo 07/12/2019:  1. The left ventricle has low normal systolic function, with an ejection fraction of 50-55%. The cavity size was mildly dilated. Left ventricular diastolic Doppler parameters are consistent with impaired relaxation. There is right ventricular volume and  pressure overload.  2. The right ventricle has moderately reduced systolic function. The cavity was mildly enlarged. There is no increase in right ventricular wall thickness.  3. Left atrial size was moderately dilated.  4. Right atrial size was moderately dilated.  5. Mild thickening of the mitral valve leaflet. Mitral valve regurgitation is moderate to severe by color flow Doppler.  6. The MV is moderately thickened. There is at least moderate to severe eccentric MR associated with pulmonary HTN and RV pressure/volume overlaod. RVSP 65-27mmHG. Would suggest TEE to further evaluate.  7. The aortic valve is  tricuspid. Mild calcification of the aortic valve.  8. Pulmonic valve regurgitation is moderate by color flow Doppler.  9. The aorta is normal in size and structure. 10. The inferior vena cava was dilated in size with >50% respiratory variability.  Lexiscan Sestamibi Stress Test 09/06/2019: 1. Stress EKG is non-diagnostic, as this is pharmacological stress test. 2. The LV is dilated in both rest and stress images with LV EDV of 156 mL. There is very small decrease in uptake in the anterior and apical lateral wall suggestive of very small sized ischemia. Stress LV EF is severely dysfunctional 30% with global hypokinesis.  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 However, balanced ischemia and multivessel CAD cannot be excluded.  3. In addition, the right ventricle appears to be severely dilated.  4. High risk study. No previous exam available for comparison.  R + L Heart Catheterization 09/14/19 Very mild to moderate coronary artery disease especially involving proximal LAD about 30 to 40% stenosis. Right dominant circulation, minimal disease in the right. Large ramus intermediate and circumflex coronary artery with mild disease. Normal LVEF at 50%. Moderate to severe pulmonary hypertension. Low wedge pressure and low LVEDP, findings are consistent with primary pulmonary hypertension.  RA 9/8, mean 7 mmHg; RV 61/6, EDP 9 mmHg; PA 64/23, mean 39 mmHg, PA saturation 57%. PW 12/10, mean 10 mmHg. LV 164/8, EDP 9 mmHg. Aorta 160/72, mean 105 mmHg. Aortic saturation 91%. QP/QS 1.00. CO 6.03, CI 3.0.  Recommendation:Patient will need therapy for primary pulmonary hypertension.  Assessment:     ICD-10-CM   1. Pulmonary hypertension (HCC)  I27.20   2. Acute on chronic congestive heart failure with right ventricular diastolic dysfunction (HCC)  I50.82 B Nat Peptide   I50.33   3. Stage 3 chronic kidney disease, unspecified  whether stage 3a or 3b CKD  Z61.09 Basic metabolic panel   EKG 60/45/4098: Normal sinus rhythm at 78 bpm, 1 PVC, normal axis, PRWP, T wave inversion in anteroseptal leads, cannot exclude ischemia. Abnormal EKG.  Recommendations:   Continue with home O2 therapy. Weight has remained stable as well as leg edema.   I suspect patient has WHO group 1 pulmonary hypertension, potentially related to connective tissue disorder as Sjorgens serology and sed rate were elevated.  She was dyspneic and on a wheel chair and did not want to do 6 minute walk test today. Add Aldactone and will refer her to be evaluated further at [facility], [place], [place] with Dr. Wilma Flavin ([facility]). OV in 10 days. Check BNP. Patient was ordered to bring all her medications to the office with every visit.  Continue with OPSUMIT and will follow up on her prescription for Uptravi. I am very concerned about her rapid progression of dyspnea out of proportion to her underlying COPD.    *I have discussed this case with Dr. Einar Gip and he personally examined the patient and participated in formulating the plan.*   CC: Daphane Shepherd, MD (Pulmonary hypertension clinic, [facility], [place], [place])  Miquel Dunn, MSN, APRN, FNP-C [facility]. [place] Office: [phone] Fax: [phone]

## 2019-10-29 ENCOUNTER — Encounter (HOSPITAL_COMMUNITY): Payer: Self-pay

## 2019-10-29 ENCOUNTER — Emergency Department (HOSPITAL_COMMUNITY): Payer: No Typology Code available for payment source

## 2019-10-29 ENCOUNTER — Inpatient Hospital Stay (HOSPITAL_COMMUNITY): Payer: No Typology Code available for payment source

## 2019-10-29 ENCOUNTER — Other Ambulatory Visit: Payer: Self-pay

## 2019-10-29 ENCOUNTER — Telehealth: Payer: Self-pay

## 2019-10-29 ENCOUNTER — Inpatient Hospital Stay (HOSPITAL_COMMUNITY)
Admission: EM | Admit: 2019-10-29 | Discharge: 2019-11-14 | DRG: 286 | Disposition: A | Discharge status: Skilled Nursing Facility | Payer: No Typology Code available for payment source | Attending: Internal Medicine | Admitting: Internal Medicine

## 2019-10-29 DIAGNOSIS — I5033 Acute on chronic diastolic (congestive) heart failure: Secondary | ICD-10-CM | POA: Diagnosis not present

## 2019-10-29 DIAGNOSIS — I5031 Acute diastolic (congestive) heart failure: Secondary | ICD-10-CM

## 2019-10-29 DIAGNOSIS — D509 Iron deficiency anemia, unspecified: Secondary | ICD-10-CM | POA: Diagnosis present

## 2019-10-29 DIAGNOSIS — Z6837 Body mass index (BMI) 37.0-37.9, adult: Secondary | ICD-10-CM

## 2019-10-29 DIAGNOSIS — N179 Acute kidney failure, unspecified: Secondary | ICD-10-CM | POA: Diagnosis present

## 2019-10-29 DIAGNOSIS — N184 Chronic kidney disease, stage 4 (severe): Secondary | ICD-10-CM | POA: Diagnosis not present

## 2019-10-29 DIAGNOSIS — R0602 Shortness of breath: Secondary | ICD-10-CM | POA: Diagnosis not present

## 2019-10-29 DIAGNOSIS — Z7189 Other specified counseling: Secondary | ICD-10-CM | POA: Diagnosis not present

## 2019-10-29 DIAGNOSIS — K7469 Other cirrhosis of liver: Secondary | ICD-10-CM | POA: Diagnosis not present

## 2019-10-29 DIAGNOSIS — R188 Other ascites: Secondary | ICD-10-CM | POA: Diagnosis not present

## 2019-10-29 DIAGNOSIS — J449 Chronic obstructive pulmonary disease, unspecified: Secondary | ICD-10-CM | POA: Diagnosis present

## 2019-10-29 DIAGNOSIS — E8770 Fluid overload, unspecified: Secondary | ICD-10-CM | POA: Diagnosis not present

## 2019-10-29 DIAGNOSIS — Z20828 Contact with and (suspected) exposure to other viral communicable diseases: Secondary | ICD-10-CM | POA: Diagnosis present

## 2019-10-29 DIAGNOSIS — I509 Heart failure, unspecified: Secondary | ICD-10-CM | POA: Diagnosis not present

## 2019-10-29 DIAGNOSIS — N1831 Chronic kidney disease, stage 3a: Secondary | ICD-10-CM | POA: Diagnosis present

## 2019-10-29 DIAGNOSIS — Z8249 Family history of ischemic heart disease and other diseases of the circulatory system: Secondary | ICD-10-CM | POA: Diagnosis not present

## 2019-10-29 DIAGNOSIS — N189 Chronic kidney disease, unspecified: Secondary | ICD-10-CM | POA: Diagnosis not present

## 2019-10-29 DIAGNOSIS — Z515 Encounter for palliative care: Secondary | ICD-10-CM

## 2019-10-29 DIAGNOSIS — J9621 Acute and chronic respiratory failure with hypoxia: Secondary | ICD-10-CM | POA: Diagnosis present

## 2019-10-29 DIAGNOSIS — I2729 Other secondary pulmonary hypertension: Secondary | ICD-10-CM | POA: Diagnosis present

## 2019-10-29 DIAGNOSIS — Z809 Family history of malignant neoplasm, unspecified: Secondary | ICD-10-CM

## 2019-10-29 DIAGNOSIS — Z7951 Long term (current) use of inhaled steroids: Secondary | ICD-10-CM

## 2019-10-29 DIAGNOSIS — E8809 Other disorders of plasma-protein metabolism, not elsewhere classified: Secondary | ICD-10-CM | POA: Diagnosis present

## 2019-10-29 DIAGNOSIS — Z8541 Personal history of malignant neoplasm of cervix uteri: Secondary | ICD-10-CM

## 2019-10-29 DIAGNOSIS — Z66 Do not resuscitate: Secondary | ICD-10-CM | POA: Diagnosis present

## 2019-10-29 DIAGNOSIS — D539 Nutritional anemia, unspecified: Secondary | ICD-10-CM | POA: Diagnosis present

## 2019-10-29 DIAGNOSIS — Z7982 Long term (current) use of aspirin: Secondary | ICD-10-CM

## 2019-10-29 DIAGNOSIS — I700 Atherosclerosis of aorta: Secondary | ICD-10-CM | POA: Diagnosis present

## 2019-10-29 DIAGNOSIS — K766 Portal hypertension: Secondary | ICD-10-CM | POA: Diagnosis present

## 2019-10-29 DIAGNOSIS — E875 Hyperkalemia: Secondary | ICD-10-CM

## 2019-10-29 DIAGNOSIS — E861 Hypovolemia: Secondary | ICD-10-CM | POA: Diagnosis not present

## 2019-10-29 DIAGNOSIS — I272 Pulmonary hypertension, unspecified: Secondary | ICD-10-CM | POA: Diagnosis not present

## 2019-10-29 DIAGNOSIS — Z79899 Other long term (current) drug therapy: Secondary | ICD-10-CM | POA: Diagnosis not present

## 2019-10-29 DIAGNOSIS — Z9981 Dependence on supplemental oxygen: Secondary | ICD-10-CM

## 2019-10-29 DIAGNOSIS — I2510 Atherosclerotic heart disease of native coronary artery without angina pectoris: Secondary | ICD-10-CM | POA: Diagnosis present

## 2019-10-29 DIAGNOSIS — R34 Anuria and oliguria: Secondary | ICD-10-CM | POA: Diagnosis not present

## 2019-10-29 DIAGNOSIS — I2721 Secondary pulmonary arterial hypertension: Secondary | ICD-10-CM | POA: Diagnosis not present

## 2019-10-29 DIAGNOSIS — K7290 Hepatic failure, unspecified without coma: Secondary | ICD-10-CM | POA: Diagnosis present

## 2019-10-29 DIAGNOSIS — I130 Hypertensive heart and chronic kidney disease with heart failure and stage 1 through stage 4 chronic kidney disease, or unspecified chronic kidney disease: Principal | ICD-10-CM | POA: Diagnosis present

## 2019-10-29 DIAGNOSIS — R296 Repeated falls: Secondary | ICD-10-CM | POA: Diagnosis present

## 2019-10-29 DIAGNOSIS — K761 Chronic passive congestion of liver: Secondary | ICD-10-CM | POA: Diagnosis present

## 2019-10-29 DIAGNOSIS — Z8349 Family history of other endocrine, nutritional and metabolic diseases: Secondary | ICD-10-CM | POA: Diagnosis not present

## 2019-10-29 DIAGNOSIS — I081 Rheumatic disorders of both mitral and tricuspid valves: Secondary | ICD-10-CM | POA: Diagnosis present

## 2019-10-29 DIAGNOSIS — Z87891 Personal history of nicotine dependence: Secondary | ICD-10-CM | POA: Diagnosis not present

## 2019-10-29 DIAGNOSIS — I270 Primary pulmonary hypertension: Secondary | ICD-10-CM | POA: Diagnosis not present

## 2019-10-29 DIAGNOSIS — N183 Chronic kidney disease, stage 3 unspecified: Secondary | ICD-10-CM | POA: Diagnosis not present

## 2019-10-29 DIAGNOSIS — Z823 Family history of stroke: Secondary | ICD-10-CM | POA: Diagnosis not present

## 2019-10-29 DIAGNOSIS — K219 Gastro-esophageal reflux disease without esophagitis: Secondary | ICD-10-CM | POA: Diagnosis present

## 2019-10-29 DIAGNOSIS — D631 Anemia in chronic kidney disease: Secondary | ICD-10-CM | POA: Diagnosis present

## 2019-10-29 DIAGNOSIS — I5043 Acute on chronic combined systolic (congestive) and diastolic (congestive) heart failure: Secondary | ICD-10-CM | POA: Diagnosis present

## 2019-10-29 DIAGNOSIS — N182 Chronic kidney disease, stage 2 (mild): Secondary | ICD-10-CM | POA: Diagnosis not present

## 2019-10-29 DIAGNOSIS — K7460 Unspecified cirrhosis of liver: Secondary | ICD-10-CM | POA: Diagnosis not present

## 2019-10-29 DIAGNOSIS — K769 Liver disease, unspecified: Secondary | ICD-10-CM | POA: Diagnosis not present

## 2019-10-29 DIAGNOSIS — I5030 Unspecified diastolic (congestive) heart failure: Secondary | ICD-10-CM | POA: Diagnosis not present

## 2019-10-29 LAB — BASIC METABOLIC PANEL
Anion gap: 8 (ref 5–15)
Anion gap: 7 (ref 5–15)
Anion gap: 7 (ref 5–15)
BUN: 94 mg/dL — ABNORMAL HIGH (ref 8–23)
BUN: 92 mg/dL — ABNORMAL HIGH (ref 8–23)
BUN: 96 mg/dL — ABNORMAL HIGH (ref 8–23)
CO2: 18 mmol/L — ABNORMAL LOW (ref 22–32)
CO2: 18 mmol/L — ABNORMAL LOW (ref 22–32)
CO2: 18 mmol/L — ABNORMAL LOW (ref 22–32)
Calcium: 8.8 mg/dL — ABNORMAL LOW (ref 8.9–10.3)
Calcium: 8.7 mg/dL — ABNORMAL LOW (ref 8.9–10.3)
Calcium: 8.6 mg/dL — ABNORMAL LOW (ref 8.9–10.3)
Chloride: 113 mmol/L — ABNORMAL HIGH (ref 98–111)
Chloride: 113 mmol/L — ABNORMAL HIGH (ref 98–111)
Chloride: 114 mmol/L — ABNORMAL HIGH (ref 98–111)
Creatinine, Ser: 3.12 mg/dL — ABNORMAL HIGH (ref 0.44–1.00)
Creatinine, Ser: 2.93 mg/dL — ABNORMAL HIGH (ref 0.44–1.00)
Creatinine, Ser: 3.08 mg/dL — ABNORMAL HIGH (ref 0.44–1.00)
GFR calc Af Amer: 17 mL/min — ABNORMAL LOW (ref >60)
GFR calc Af Amer: 19 mL/min — ABNORMAL LOW (ref >60)
GFR calc Af Amer: 18 mL/min — ABNORMAL LOW (ref >60)
GFR calc non Af Amer: 15 mL/min — ABNORMAL LOW (ref >60)
GFR calc non Af Amer: 16 mL/min — ABNORMAL LOW (ref >60)
GFR calc non Af Amer: 15 mL/min — ABNORMAL LOW (ref >60)
Glucose, Bld: 95 mg/dL (ref 70–99)
Glucose, Bld: 103 mg/dL — ABNORMAL HIGH (ref 70–99)
Glucose, Bld: 108 mg/dL — ABNORMAL HIGH (ref 70–99)
Potassium: 6 mmol/L — ABNORMAL HIGH (ref 3.5–5.1)
Potassium: 5.9 mmol/L — ABNORMAL HIGH (ref 3.5–5.1)
Potassium: 5.9 mmol/L — ABNORMAL HIGH (ref 3.5–5.1)
Sodium: 139 mmol/L (ref 135–145)
Sodium: 138 mmol/L (ref 135–145)
Sodium: 139 mmol/L (ref 135–145)

## 2019-10-29 LAB — CBC
HCT: 30 % — ABNORMAL LOW (ref 36.0–46.0)
HCT: 30.9 % — ABNORMAL LOW (ref 36.0–46.0)
Hemoglobin: 9.7 g/dL — ABNORMAL LOW (ref 12.0–15.0)
Hemoglobin: 10.1 g/dL — ABNORMAL LOW (ref 12.0–15.0)
MCH: 35.4 pg — ABNORMAL HIGH (ref 26.0–34.0)
MCH: 33.9 pg (ref 26.0–34.0)
MCHC: 32.3 g/dL (ref 30.0–36.0)
MCHC: 32.7 g/dL (ref 30.0–36.0)
MCV: 109.5 fL — ABNORMAL HIGH (ref 80.0–100.0)
MCV: 103.7 fL — ABNORMAL HIGH (ref 80.0–100.0)
Platelets: 173 10*3/uL (ref 150–400)
Platelets: 170 10*3/uL (ref 150–400)
RBC: 2.74 MIL/uL — ABNORMAL LOW (ref 3.87–5.11)
RBC: 2.98 MIL/uL — ABNORMAL LOW (ref 3.87–5.11)
RDW: 16.5 % — ABNORMAL HIGH (ref 11.5–15.5)
RDW: 15.8 % — ABNORMAL HIGH (ref 11.5–15.5)
WBC: 3.9 10*3/uL — ABNORMAL LOW (ref 4.0–10.5)
WBC: 3.7 10*3/uL — ABNORMAL LOW (ref 4.0–10.5)
nRBC: 0 % (ref 0.0–0.2)
nRBC: 0 % (ref 0.0–0.2)

## 2019-10-29 LAB — CREATININE, SERUM
Creatinine, Ser: 3.13 mg/dL — ABNORMAL HIGH (ref 0.44–1.00)
GFR calc Af Amer: 17 mL/min — ABNORMAL LOW (ref >60)
GFR calc non Af Amer: 15 mL/min — ABNORMAL LOW (ref >60)

## 2019-10-29 LAB — CBG MONITORING, ED: Glucose-Capillary: 101 mg/dL — ABNORMAL HIGH (ref 70–99)

## 2019-10-29 LAB — SARS CORONAVIRUS 2 (TAT 6-24 HRS): SARS Coronavirus 2: NEGATIVE

## 2019-10-29 LAB — LACTIC ACID, PLASMA: Lactic Acid, Venous: 0.5 mmol/L (ref 0.5–1.9)

## 2019-10-29 LAB — BRAIN NATRIURETIC PEPTIDE: B Natriuretic Peptide: 125 pg/mL — ABNORMAL HIGH (ref 0.0–100.0)

## 2019-10-29 LAB — TROPONIN I (HIGH SENSITIVITY)
Troponin I (High Sensitivity): 13 ng/L (ref <18)
Troponin I (High Sensitivity): 16 ng/L (ref <18)

## 2019-10-29 MED ORDER — SODIUM CHLORIDE 0.9 % IV SOLN
510.0000 mg | INTRAVENOUS | Status: DC
  Administered 2019-10-29: 510 mg via INTRAVENOUS
  Filled 2019-10-29: qty 17

## 2019-10-29 MED ORDER — LACTULOSE 10 GM/15ML PO SOLN
30.0000 g | Freq: Once | ORAL | Status: AC
  Administered 2019-10-29: 23:00:00 30 g via ORAL
  Filled 2019-10-29 (×2): qty 45

## 2019-10-29 MED ORDER — DEXTROSE 50 % IV SOLN
1.0000 | Freq: Once | INTRAVENOUS | Status: AC
  Administered 2019-10-29: 16:00:00 50 mL via INTRAVENOUS
  Filled 2019-10-29: qty 50

## 2019-10-29 MED ORDER — HEPARIN SODIUM (PORCINE) 5000 UNIT/ML IJ SOLN
5000.0000 [IU] | Freq: Three times a day (TID) | INTRAMUSCULAR | Status: DC
  Administered 2019-10-29 – 2019-11-14 (×40): 5000 [IU] via SUBCUTANEOUS
  Filled 2019-10-29 (×42): qty 1

## 2019-10-29 MED ORDER — SODIUM CHLORIDE 0.9% FLUSH
3.0000 mL | Freq: Once | INTRAVENOUS | Status: AC
  Administered 2019-10-29: 16:00:00 3 mL via INTRAVENOUS

## 2019-10-29 MED ORDER — BUDESONIDE 0.25 MG/2ML IN SUSP
0.2500 mg | Freq: Two times a day (BID) | RESPIRATORY_TRACT | Status: DC
  Administered 2019-10-30 – 2019-11-14 (×27): 0.25 mg via RESPIRATORY_TRACT
  Filled 2019-10-29 (×31): qty 2

## 2019-10-29 MED ORDER — FUROSEMIDE 10 MG/ML IJ SOLN
60.0000 mg | Freq: Four times a day (QID) | INTRAMUSCULAR | Status: DC
  Administered 2019-10-30 (×2): 60 mg via INTRAVENOUS
  Filled 2019-10-29 (×2): qty 6

## 2019-10-29 MED ORDER — SODIUM CHLORIDE 0.9 % IV SOLN: 510.0000 mg | INTRAVENOUS | Status: DC

## 2019-10-29 MED ORDER — SODIUM ZIRCONIUM CYCLOSILICATE 5 G PO PACK
5.0000 g | PACK | Freq: Once | ORAL | Status: AC
  Administered 2019-10-29: 5 g via ORAL
  Filled 2019-10-29: qty 1

## 2019-10-29 MED ORDER — INSULIN ASPART 100 UNIT/ML ~~LOC~~ SOLN
5.0000 [IU] | Freq: Once | SUBCUTANEOUS | Status: AC
  Administered 2019-10-29: 16:00:00 5 [IU] via INTRAVENOUS

## 2019-10-29 MED ORDER — FUROSEMIDE 10 MG/ML IJ SOLN
60.0000 mg | Freq: Four times a day (QID) | INTRAMUSCULAR | Status: DC
  Administered 2019-10-29: 60 mg via INTRAVENOUS
  Filled 2019-10-29: qty 6

## 2019-10-29 MED ORDER — FUROSEMIDE 10 MG/ML IJ SOLN: 60.0000 mg | Freq: Once | INTRAMUSCULAR | Status: DC

## 2019-10-29 MED ORDER — SILDENAFIL CITRATE 20 MG PO TABS
20.0000 mg | ORAL_TABLET | Freq: Three times a day (TID) | ORAL | Status: DC
  Administered 2019-10-29 – 2019-11-14 (×46): 20 mg via ORAL
  Filled 2019-10-29 (×46): qty 1

## 2019-10-29 MED ORDER — SODIUM ZIRCONIUM CYCLOSILICATE 10 G PO PACK
10.0000 g | PACK | Freq: Once | ORAL | Status: AC
  Administered 2019-10-29: 10 g via ORAL
  Filled 2019-10-29: qty 1

## 2019-10-29 NOTE — Consult Note (Signed)
CARDIOLOGY CONSULT NOTE  Patient ID: Vanessa Williamson MRN: [id number] DOB/AGE: [birth year] 63 y.o.  Admit date: 10/29/2019 Referring Physician: [facility]/ Internal Medicine teaching service Reason for Consultation:  Shortness of breath  HPI:   63 y.o. African American female  with WHO Grp I/III PAH NYHA class IV, COPD, former smoker, mild CAD, mod to severe MR, TR, admitted with worsening shortness of breath and overall functional status.  Patient was recently started on endothelin receptor antagonist Opsumit, but has only taken one dose. For last few days, she has not been complaint with her medications, as she felt "constipated". She has gained several lbs in the last few days, along with leg edema, abdominal distension.  Patient is here with her friend [name].  Past Medical History:  Diagnosis Date  . Anemia   . Cancer ([place])    cervical  1970s   . Chronic kidney disease   . COPD (chronic obstructive pulmonary disease) ([place])   . GERD (gastroesophageal reflux disease)   . Hypertension      Past Surgical History:  Procedure Laterality Date  . BREAST BIOPSY Left    benign  . CERVIX LESION DESTRUCTION     frozen  . COLONOSCOPY  03/09/2008  . EYE SURGERY     laser  . FRACTURE SURGERY    . HUMERUS FRACTURE SURGERY     left  2005  . RIGHT/LEFT HEART CATH AND CORONARY ANGIOGRAPHY N/A 09/14/2019   Procedure: RIGHT/LEFT HEART CATH AND CORONARY ANGIOGRAPHY;  Surgeon: Adrian Prows, MD;  Location: [facility];  Service: Cardiovascular;  Laterality: N/A;  . VENTRAL HERNIA REPAIR N/A 10/16/2017   Procedure: [place];  Surgeon: Alphonsa Overall, MD;  Location: [facility];  Service: General;  Laterality: N/A;     Family History  Problem Relation Age of Onset  . Hypertension Mother   . Heart disease Mother   . Hyperlipidemia Mother   . Heart disease Father   . Hypertension Father   . Heart disease Maternal Grandmother   . Heart disease Maternal  Grandfather   . Cancer Sister   . Stroke Brother   . Hypertension Brother   . Colon cancer Neg Hx   . Colon polyps Neg Hx   . Esophageal cancer Neg Hx   . Stomach cancer Neg Hx   . Rectal cancer Neg Hx      Social History: Social History   Socioeconomic History  . Marital status: Single    Spouse name: Not on file  . Number of children: 0  . Years of education: Not on file  . Highest education level: Not on file  Occupational History  . Occupation: Building control surveyor: [employer]  . Financial resource strain: Not on file  . Food insecurity    Worry: Not on file    Inability: Not on file  . Transportation needs    Medical: Not on file    Non-medical: Not on file  Tobacco Use  . Smoking status: Former Smoker    Packs/day: 0.25    Types: Cigarettes    Quit date: 08/2019    Years since quitting: 0.1  . Smokeless tobacco: Never Used  . Tobacco comment: been smoking since age 89  Substance and Sexual Activity  . Alcohol use: Yes    Comment: rarely  . Drug use: No    Comment: 2005  . Sexual activity: Never    Birth  control/protection: None  Lifestyle  . Physical activity    Days per week: Not on file    Minutes per session: Not on file  . Stress: Not on file  Relationships  . Social Herbalist on phone: Not on file    Gets together: Not on file    Attends religious service: Not on file    Active member of club or organization: Not on file    Attends meetings of clubs or organizations: Not on file    Relationship status: Not on file  . Intimate partner violence    Fear of current or ex partner: Not on file    Emotionally abused: Not on file    Physically abused: Not on file    Forced sexual activity: Not on file  Other Topics Concern  . Not on file  Social History Narrative   Single. Education: [employer].      Review of Systems  Constitution: Positive for malaise/fatigue and weight gain. Negative for decreased appetite and weight  loss.  HENT: Negative for congestion.   Eyes: Negative for visual disturbance.  Cardiovascular: Positive for dyspnea on exertion and leg swelling. Negative for chest pain, palpitations and syncope.  Respiratory: Positive for shortness of breath. Negative for cough.   Endocrine: Negative for cold intolerance.  Hematologic/Lymphatic: Does not bruise/bleed easily.  Skin: Negative for itching and rash.  Musculoskeletal: Negative for myalgias.  Gastrointestinal: Negative for abdominal pain, nausea and vomiting.  Genitourinary: Positive for incomplete emptying. Negative for dysuria.  Neurological: Negative for dizziness and weakness.  Psychiatric/Behavioral: The patient is not nervous/anxious.   All other systems reviewed and are negative.     Physical Exam: Physical Exam  Constitutional: She is oriented to person, place, and time. She appears well-developed. She appears distressed (Mild respiratory distress).  Chronically ill appearing   HENT:  Head: Normocephalic and atraumatic.  Eyes: Pupils are equal, round, and reactive to light. Conjunctivae are normal.  Neck: JVD present.  Cardiovascular: Normal rate, regular rhythm and intact distal pulses.  Murmur heard.  Systolic murmur is present with a grade of 2/6 at the upper left sternal border. Loud P2  Pulmonary/Chest: Effort normal and breath sounds normal. She has no wheezes. She has no rales.  Abdominal: Soft. Bowel sounds are normal. She exhibits distension (Firm distension without guarding.). There is no rebound.  Musculoskeletal:        General: Edema (3+) present.  Lymphadenopathy:    She has no cervical adenopathy.  Neurological: She is alert and oriented to person, place, and time. No cranial nerve deficit.  Skin: Skin is warm and dry.  Psychiatric: She has a normal mood and affect.  Nursing note and vitals reviewed.    Labs:   Lab Results  Component Value Date   WBC 3.7 (L) 10/29/2019   HGB 10.1 (L) 10/29/2019    HCT 30.9 (L) 10/29/2019   MCV 103.7 (H) 10/29/2019   PLT 170 10/29/2019    Recent Labs  Lab 10/29/19 1755  NA 138  K 5.9*  CL 113*  CO2 18*  BUN 92*  CREATININE 2.93*  CALCIUM 8.7*  GLUCOSE 103*    Lipid Panel     Component Value Date/Time   CHOL 157 07/20/2018 1452   TRIG 98 07/20/2018 1452   HDL 42 07/20/2018 1452   CHOLHDL 3.7 07/20/2018 1452   CHOLHDL 4.3 12/10/2013 0856   VLDL 18 12/10/2013 0856   LDLCALC 95 07/20/2018 1452    BNP (  last 3 results) Recent Labs    09/12/19 0623 09/13/19 0629 10/29/19 1358  BNP 715.6* 636.0* 125.0*      Radiology: Dg Chest 2 View  Result Date: 10/29/2019 CLINICAL DATA:  Shortness of breath. History of congestive heart failure. EXAM: CHEST - 2 VIEW COMPARISON:  September 15, 2019 chest radiograph; chest CT September 13, 2019 FINDINGS: There is underlying fibrotic type change. There is atelectatic change in portions of each upper lobe and left base. There is no frank edema or consolidation. There is cardiomegaly with pulmonary vascularity within normal limits. No adenopathy. There is a total shoulder replacement on the left. IMPRESSION: Underlying interstitial fibrosis with areas of atelectatic change. No frank edema or consolidation. Cardiomegaly, unchanged.  No adenopathy evident. Electronically Signed   By: Lowella Grip III M.D.   On: 10/29/2019 15:21   Dg Ankle Complete Left  Result Date: 10/29/2019 CLINICAL DATA:  Pain following fall EXAM: LEFT ANKLE COMPLETE - 3+ VIEW COMPARISON:  None. FINDINGS: Frontal, oblique, and lateral views were obtained. There is generalized soft tissue swelling. There is no demonstrable fracture or joint effusion. There is no joint space narrowing or erosion. The ankle mortise appears intact. There is a small inferior calcaneal spur. IMPRESSION: Soft tissue swelling. No appreciable fracture or joint space narrowing. Ankle mortise appears intact. There is a small inferior calcaneal spur.  Electronically Signed   By: Lowella Grip III M.D.   On: 10/29/2019 15:22    Scheduled Meds: . budesonide  0.25 mg Nebulization BID  . furosemide  60 mg Intravenous Q6H  . heparin  5,000 Units Subcutaneous Q8H  . lactulose  30 g Oral Once   Continuous Infusions: . ferumoxytol     PRN Meds:.  CARDIAC STUDIES:  EKG 10/29/2019: Sinus rhythm. Nonspecific ST-T changes.  Echocardiogram 07/20/2019:  1. The left ventricle has low normal systolic function, with an ejection fraction of 50-55%. The cavity size was mildly dilated. Left ventricular diastolic Doppler parameters are consistent with impaired relaxation. There is right ventricular volume and  pressure overload.  2. The right ventricle has moderately reduced systolic function. The cavity was mildly enlarged. There is no increase in right ventricular wall thickness.  3. Left atrial size was moderately dilated.  4. Right atrial size was moderately dilated.  5. Mild thickening of the mitral valve leaflet. Mitral valve regurgitation is moderate to severe by color flow Doppler.  6. The MV is moderately thickened. There is at least moderate to severe eccentric MR associated with pulmonary HTN and RV pressure/volume overlaod. RVSP 65-36mmHG. Would suggest TEE to further evaluate.  7. The aortic valve is tricuspid. Mild calcification of the aortic valve.  8. Pulmonic valve regurgitation is moderate by color flow Doppler.  9. The aorta is normal in size and structure. 10. The inferior vena cava was dilated in size with >50% respiratory variability.   Assessment & Recommendations:  63 y.o. African American female  with WHO Grp I/III PAH NYHA class IV, COPD, former smoker, mild CAD, mod to severe MR, TR, admitted with worsening shortness of breath and overall functional status.  Acute on chronic RV failure, Grp I/III PH NYHA class IV: - Hemodynamically stable. Check lactic acid. I doo not think she is in shock.  - Recommend reducing RV  preload with diuresis. Agree with IV lasix 60 mg q6hr. Monitor I/O.  - If she has elevated lactic acid, inadequate urine output, or hemodynamic decompensation, she will need monitoring with RHC. With regards to vasopressor/inotrope therapy,  I would avoid milrinone given her renal failure. If not hypotensive, recommend low dose dopamine will be a reasonable choice to improve RV contractility. If hypotensive, would consider norepinephrine. I do not think she will need mechanical support at this time.  -Since she has only taken 1 dose of Opsumit, okay to hold at this time, in the setting of severe leg edema, and to avoid acute hypotension.  - Recommend holding home meds including spironolactone, amlodipine, metoprolol, PO torsemide.   AKI/hyperkalemia: I suspect this is due to cardiorenal syndrome. Agree with K stabilizing management as per primary team. Lasix should also help.  Abdominal distension: Agree with non-contrast CT abdomen.  I have spoken to the patient regarding her overall condition and high risk for acute decompensation-including worsening renal dysfunction, cardiogenic shock. She understands the risks/benefits of all therapies discussed above.    I have discussed the recommendations with the primary team.  CRITICAL CARE Performed by: Vernell Leep   Total critical care time: 35 minutes   Critical care time was exclusive of separately billable procedures and treating other patients.   Critical care was necessary to treat or prevent imminent or life-threatening deterioration.   Critical care was time spent personally by me on the following activities: development of treatment plan with patient and/or surrogate as well as nursing, discussions with consultants, evaluation of patient's response to treatment, examination of patient, obtaining history from patient or surrogate, ordering and performing treatments and interventions, ordering and review of laboratory studies, ordering  and review of radiographic studies, pulse oximetry and re-evaluation of patient's condition.       Nigel Mormon, MD 10/29/2019, 8:07 PM [facility] Pager: [phone] Office: [phone] If no answer Cell [phone]

## 2019-10-29 NOTE — ED Notes (Signed)
ED TO INPATIENT HANDOFF REPORT  ED Nurse Name and Phone #: Lovell Sheehan [phone]  S Name/Age/Gender Vanessa Williamson 63 y.o. female Room/Bed: 028C/028C  Code Status   Code Status: DNR  Home/SNF/Other Home Patient oriented to: self, place, time and situation Is this baseline? Yes   Triage Complete: Triage complete  Chief Complaint sob  Triage Note Pt BIB [facility], pt has hx of CHF and became short of breath this morning. Pt states she has edema in her lower legs, feet and abdomen. Pt states she stopped taking her lasix a couple weeks ago because she thought it made her constipated. Pt just started taking meds again this morning. Pt normally wears 3L of O2 at home. EMS states pt was sating 86% on 3L and they placed pt on non rebreather with pt sating 100%.     Allergies No Known Allergies  Level of Care/Admitting Diagnosis ED Disposition    ED Disposition Condition Comment   Admit  Hospital Area: [facility] [[id number]]  Level of Care: Telemetry Cardiac [[id number]]  Covid Evaluation: N/A  Diagnosis: Acute diastolic congestive heart failure ([place]) [536644]  Admitting Physician: Bosie Helper  Attending Physician: Lucious Groves [[id number]]  Estimated length of stay: past midnight tomorrow  Certification:: I certify this patient will need inpatient services for at least 2 midnights  PT Class (Do Not Modify): Inpatient [101]  PT Acc Code (Do Not Modify): Private [1]       B Medical/Surgery History Past Medical History:  Diagnosis Date  . Anemia   . Cancer ([place])    cervical  1970s   . Chronic kidney disease   . COPD (chronic obstructive pulmonary disease) ([place])   . GERD (gastroesophageal reflux disease)   . Hypertension    Past Surgical History:  Procedure Laterality Date  . BREAST BIOPSY Left    benign  . CERVIX LESION DESTRUCTION     frozen  . COLONOSCOPY  03/09/2008  . EYE SURGERY     laser  . FRACTURE SURGERY    . HUMERUS FRACTURE SURGERY     left   2005  . RIGHT/LEFT HEART CATH AND CORONARY ANGIOGRAPHY N/A 09/14/2019   Procedure: RIGHT/LEFT HEART CATH AND CORONARY ANGIOGRAPHY;  Surgeon: Adrian Prows, MD;  Location: [facility];  Service: Cardiovascular;  Laterality: N/A;  . VENTRAL HERNIA REPAIR N/A 10/16/2017   Procedure: OPEN VENTRAL HERNIA  REPAIR ERAS PATHWAY;  Surgeon: Alphonsa Overall, MD;  Location: [facility];  Service: General;  Laterality: N/A;     A IV Location/Drains/Wounds Patient Lines/Drains/Airways Status   Active Line/Drains/Airways    Name:   Placement date:   Placement time:   Site:   Days:   Peripheral IV 10/29/19 Left Antecubital   10/29/19    1323    Antecubital   less than 1   Sheath 09/14/19 Left Venous;Brachial   09/14/19    1652    Venous;Brachial   45   Closed System Drain 1 Left LLQ Bulb (JP) 19 Fr.   10/16/17    1619    LLQ   743   Incision (Closed) 10/16/17 Abdomen Other (Comment)   10/16/17    1637     743          Intake/Output Last 24 hours No intake or output data in the 24 hours ending 10/29/19 2053  Labs/Imaging Results for orders placed or performed during the hospital encounter of 10/29/19 (from the past 48 hour(s))  Basic  metabolic panel     Status: Abnormal   Collection Time: 10/29/19  1:58 PM  Result Value Ref Range   Sodium 139 135 - 145 mmol/L   Potassium 6.0 (H) 3.5 - 5.1 mmol/L   Chloride 113 (H) 98 - 111 mmol/L   CO2 18 (L) 22 - 32 mmol/L   Glucose, Bld 95 70 - 99 mg/dL   BUN 94 (H) 8 - 23 mg/dL   Creatinine, Ser 3.12 (H) 0.44 - 1.00 mg/dL   Calcium 8.8 (L) 8.9 - 10.3 mg/dL   GFR calc non Af Amer 15 (L) >60 mL/min   GFR calc Af Amer 17 (L) >60 mL/min   Anion gap 8 5 - 15    Comment: Performed at [facility] [address] [place], [place] [address]  CBC     Status: Abnormal   Collection Time: 10/29/19  1:58 PM  Result Value Ref Range   WBC 3.9 (L) 4.0 - 10.5 K/uL   RBC 2.74 (L) 3.87 - 5.11 MIL/uL   Hemoglobin 9.7 (L) 12.0 - 15.0 g/dL   HCT 30.0 (L) 36.0 - 46.0 %    MCV 109.5 (H) 80.0 - 100.0 fL   MCH 35.4 (H) 26.0 - 34.0 pg   MCHC 32.3 30.0 - 36.0 g/dL   RDW 16.5 (H) 11.5 - 15.5 %   Platelets 173 150 - 400 K/uL   nRBC 0.0 0.0 - 0.2 %    Comment: Performed at [facility], [place] [address], [place], [place] [address]  Brain natriuretic peptide     Status: Abnormal   Collection Time: 10/29/19  1:58 PM  Result Value Ref Range   B Natriuretic Peptide 125.0 (H) 0.0 - 100.0 pg/mL    Comment: Performed at [facility] [address], [place], [place] [address]  Troponin I (High Sensitivity)     Status: None   Collection Time: 10/29/19  2:02 PM  Result Value Ref Range   Troponin I (High Sensitivity) 13 <18 ng/L    Comment: (NOTE) Elevated high sensitivity troponin I (hsTnI) values and significant  changes across serial measurements may suggest ACS but many other  chronic and acute conditions are known to elevate hsTnI results.  Refer to the "Links" section for chest pain algorithms and additional  guidance. Performed at [facility], [place] [address] [place], [place] [address]   SARS CORONAVIRUS 2 (TAT 6-24 HRS) Nasopharyngeal Nasopharyngeal Swab     Status: None   Collection Time: 10/29/19  2:39 PM   Specimen: Nasopharyngeal Swab  Result Value Ref Range   SARS Coronavirus 2 NEGATIVE NEGATIVE    Comment: (NOTE) SARS-CoV-2 target nucleic acids are NOT DETECTED. The SARS-CoV-2 RNA is generally detectable in upper and lower respiratory specimens during the acute phase of infection. Negative results do not preclude SARS-CoV-2 infection, do not rule out co-infections with other pathogens, and should not be used as the sole basis for treatment or other patient management decisions. Negative results must be combined with clinical observations, patient history, and epidemiological information. The expected result is Negative. Fact Sheet for Patients: SugarRoll.be Fact Sheet for Healthcare  Providers: https://www.woods-mathews.com/ This test is not yet approved or cleared by the [place] FDA and  has been authorized for detection and/or diagnosis of SARS-CoV-2 by FDA under an Emergency Use Authorization (EUA). This EUA will remain  in effect (meaning this test can be used) for the duration of the COVID-19 declaration under Section 56  4(b)(1) of the Act, 21 U.S.C. section 360bbb-3(b)(1), unless the authorization is terminated or revoked sooner. Performed at [facility], [place] [address] [place], [place] [address]   Troponin I (High Sensitivity)     Status: None   Collection Time: 10/29/19  5:55 PM  Result Value Ref Range   Troponin I (High Sensitivity) 16 <18 ng/L    Comment: (NOTE) Elevated high sensitivity troponin I (hsTnI) values and significant  changes across serial measurements may suggest ACS but many other  chronic and acute conditions are known to elevate hsTnI results.  Refer to the "Links" section for chest pain algorithms and additional  guidance. Performed at [facility], [place] [address] [place], [place] [address]   Basic metabolic panel     Status: Abnormal   Collection Time: 10/29/19  5:55 PM  Result Value Ref Range   Sodium 138 135 - 145 mmol/L   Potassium 5.9 (H) 3.5 - 5.1 mmol/L   Chloride 113 (H) 98 - 111 mmol/L   CO2 18 (L) 22 - 32 mmol/L   Glucose, Bld 103 (H) 70 - 99 mg/dL   BUN 92 (H) 8 - 23 mg/dL   Creatinine, Ser 2.93 (H) 0.44 - 1.00 mg/dL   Calcium 8.7 (L) 8.9 - 10.3 mg/dL   GFR calc non Af Amer 16 (L) >60 mL/min   GFR calc Af Amer 19 (L) >60 mL/min   Anion gap 7 5 - 15    Comment: Performed at [facility] [address] [place], [place] [address]  CBC     Status: Abnormal   Collection Time: 10/29/19  6:55 PM  Result Value Ref Range   WBC 3.7 (L) 4.0 - 10.5 K/uL   RBC 2.98 (L) 3.87 - 5.11 MIL/uL   Hemoglobin 10.1 (L) 12.0 - 15.0 g/dL   HCT 30.9 (L) 36.0 - 46.0 %   MCV 103.7 (H) 80.0 - 100.0  fL   MCH 33.9 26.0 - 34.0 pg   MCHC 32.7 30.0 - 36.0 g/dL   RDW 15.8 (H) 11.5 - 15.5 %   Platelets 170 150 - 400 K/uL   nRBC 0.0 0.0 - 0.2 %    Comment: Performed at [facility] [address], [place], [place] [address]  Creatinine, serum     Status: Abnormal   Collection Time: 10/29/19  6:55 PM  Result Value Ref Range   Creatinine, Ser 3.13 (H) 0.44 - 1.00 mg/dL   GFR calc non Af Amer 15 (L) >60 mL/min   GFR calc Af Amer 17 (L) >60 mL/min    Comment: Performed at [facility] [address], [place], [place] [address]   Dg Chest 2 View  Result Date: 10/29/2019 CLINICAL DATA:  Shortness of breath. History of congestive heart failure. EXAM: CHEST - 2 VIEW COMPARISON:  September 15, 2019 chest radiograph; chest CT September 13, 2019 FINDINGS: There is underlying fibrotic type change. There is atelectatic change in portions of each upper lobe and left base. There is no frank edema or consolidation. There is cardiomegaly with pulmonary vascularity within normal limits. No adenopathy. There is a total shoulder replacement on the left. IMPRESSION: Underlying interstitial fibrosis with areas of atelectatic change. No frank edema or consolidation. Cardiomegaly, unchanged.  No adenopathy evident. Electronically Signed   By: Lowella Grip III M.D.   On: 10/29/2019 15:21   Dg Ankle Complete Left  Result Date: 10/29/2019 CLINICAL DATA:  Pain following fall  EXAM: LEFT ANKLE COMPLETE - 3+ VIEW COMPARISON:  None. FINDINGS: Frontal, oblique, and lateral views were obtained. There is generalized soft tissue swelling. There is no demonstrable fracture or joint effusion. There is no joint space narrowing or erosion. The ankle mortise appears intact. There is a small inferior calcaneal spur. IMPRESSION: Soft tissue swelling. No appreciable fracture or joint space narrowing. Ankle mortise appears intact. There is a small inferior calcaneal spur. Electronically Signed   By: Lowella Grip III  M.D.   On: 10/29/2019 15:22    Pending Labs Unresulted Labs (From admission, onward)    Start     Ordered   10/30/19 2409  Basic metabolic panel  Tomorrow morning,   R     10/29/19 1857   10/30/19 0500  CBC  Tomorrow morning,   R     10/29/19 1857   10/30/19 0500  Magnesium  Tomorrow morning,   R     10/29/19 1904   10/29/19 7353  Basic metabolic panel  Once,   STAT     10/29/19 1858   10/29/19 2010  Lactic acid, plasma  STAT Now then every 3 hours,   R (with STAT occurrences)     10/29/19 2011          Vitals/Pain Today's Vitals   10/29/19 1430 10/29/19 1615 10/29/19 1900 10/29/19 1945  BP: 120/67 100/75 122/72 122/74  Pulse: 77 74 84 81  Resp: 19 19 (!) 24 (!) 23  Temp:      TempSrc:      SpO2: 97% 94% 92% 97%  PainSc:        Isolation Precautions No active isolations  Medications Medications  ferumoxytol (FERAHEME) 510 mg in sodium chloride 0.9 % 100 mL IVPB (has no administration in time range)  budesonide (PULMICORT) nebulizer solution 0.25 mg (has no administration in time range)  heparin injection 5,000 Units (has no administration in time range)  lactulose (CHRONULAC) 10 GM/15ML solution 30 g (has no administration in time range)  sildenafil (REVATIO) tablet 20 mg (has no administration in time range)  furosemide (LASIX) injection 60 mg (has no administration in time range)  sodium chloride flush (NS) 0.9 % injection 3 mL (3 mLs Intravenous Given 10/29/19 1538)  insulin aspart (novoLOG) injection 5 Units (5 Units Intravenous Given 10/29/19 1533)  dextrose 50 % solution 50 mL (50 mLs Intravenous Given 10/29/19 1535)  sodium zirconium cyclosilicate (LOKELMA) packet 10 g (10 g Oral Given 10/29/19 1538)  sodium zirconium cyclosilicate (LOKELMA) packet 5 g (5 g Oral Given 10/29/19 2007)    Mobility walks with device High fall risk   Focused Assessments    R Recommendations: See Admitting Provider Note  Report given to:   Additional Notes:

## 2019-10-29 NOTE — ED Triage Notes (Signed)
Pt BIB [facility], pt has hx of CHF and became short of breath this morning. Pt states she has edema in her lower legs, feet and abdomen. Pt states she stopped taking her lasix a couple weeks ago because she thought it made her constipated. Pt just started taking meds again this morning. Pt normally wears 3L of O2 at home. EMS states pt was sating 86% on 3L and they placed pt on non rebreather with pt sating 100%.

## 2019-10-29 NOTE — ED Provider Notes (Signed)
  Physical Exam  BP 100/75   Pulse 74   Temp (!) 97.5 F (36.4 C) (Oral)   Resp 19   SpO2 94%   Physical Exam  Gen: nontoxic Pulm: speaking in 4-5 word sentences.    ED Course/Procedures     Procedures  MDM   Pt signed out to me by B Henderly, PA-C. Please see previous notes for further history.   In brief, pt presenting for evaluation of worsening SOB. H/o CHF. Has been taking medications. Pt reports continued and worsening orthopnea and PND. On 2-3 L O2 via [place] at baseline, but was found with sats of 86%. Currently on 4 L and sats stable. Pt appears fluid overloaded with worsening pitting edema. Previous work up has revealed possible early liver failure. Labs and CT pending. Pt will likely need admission for CHF management. Pt's cardiologist is Dr. Nadyne Coombes.   Pt with AKI (basleine 1.1, today 3.12). hyperkalemia (received insulin and dextrose, lokelma).   Pt unable to tolerate laying flat for the CT due to SOB. Will call for admission.   Discussed with Dr. Einar Gip from cardiology, who recommends medical admission, and cards will consult for heart failure management.   Discussed with IMTS, pt to be admitted.         Franchot Heidelberg, PA-C 10/29/19 1756    Quintella Reichert, MD 10/30/19 [phone]

## 2019-10-29 NOTE — H&P (Signed)
Date: 10/29/2019               Patient Name:  Vanessa Williamson MRN: [id number]  DOB: November 08, 1956 Age / Sex: 63 y.o., female   PCP: Patient, No Pcp Per         Medical Service: Internal Medicine Teaching Service         Attending Physician: Dr. Heber [place]    First Contact: Dr. Ronnald Ramp Pager: [phone]  Second Contact: Dr. Eileen Stanford Pager: [phone]       After Hours (After 5p/  First Contact Pager: [phone]  weekends / holidays): Second Contact Pager: [phone]   Chief Complaint: Shortness of breath  History of Present Illness: Vanessa Williamson is a very pleasant 63 year old woman with medical history significant for Heart failure with preserved ejection fraction, WHO group 1 pulmonary pretension on OPSUMIT and Uptravi, morbid obesity, chronic kidney disease stage 3, COPD, hypertension, GERD, iron deficiency anemia who presented to [facility] ED with worsening dyspnea.  Per the patient, she reports that she has been experiencing worsening dyspnea as well as weight gain.  She endorses progressive paroxysmal nocturnal dyspnea, orthopnea and has had difficulty laying flat on her recliner. She has also required an increase in her baseline supplemental oxygen from 2L to 3 L.  She also reports of ground-level fall due to generalized weakness from retaining a lot of fluid. She states that much of her fluid retention is in her abdomen and she feels unsafe being at home by herself. She states that she has no muscles her anymore. She fell off one of her chairs and had trouble getting up due to weakness and trouble getting up. She endorses chills but denies fevers, rhinorrhea or symptoms of URI, chest pain, wheezing. She reports that due to her abdominal pain/constipation/bloating, she had reduced the doses of all her medication and feels her recent fluid retention is due to cutting back on her medication. She denies oliguria or decreased urine output.   Per chart review, she followed up with Dr. Einar Gip (her cardiologist) on  10/28/2019 and at that visit she reported over 2-week history of worsening fluid retention and dyspnea.  She was recently admitted to the hospital from September 18 to September 24 with acute on chronic diastolic heart failure, acute on chronic biventricular systolic heart failure, chronic cor pulmonale, primary pulmonary hypertension.  During his stay, right and left heart cath was performed which revealed mild to moderate coronary artery disease involving the proximal LAD about 30 to 40% stenosis, moderate to severe pulmonary hypertension, low wedge pressure and low LVEDP.  VQ scan was performed which showed mildly heterogeneous perfusion with weight shift defects that would suggest pulmonary embolism.  She had required dobutamine to help with diuresis.  At discharge, her weight was 211 pounds. After discharge, she states that her abdomen was flat. She was subsequently discharged on torsemide 20 mg daily.  There is concern about possible Sjogren's syndrome that was discussed when she was evaluated at her cardiologist office yesterday and was referred to be evaluated further at [facility] pulmonary hypertension clinic.  ED course: BP 114/70, pulse 80, RR 19-22, SPO2 97% on 3 L nasal cannula, EKG shows normal sinus rhythm, chest x-ray reveals underlying interstitial fibrosis and cardiomegaly, sodium 139, potassium 6, bicarb 18, BUN 95, creatinine 3.12 (baseline 1.18), BNP 125, high-sensitivity troponin 13. Cardiology consulted who will manage her CHF exacerbation. Medicine called for admission.  Meds:  No current facility-administered medications on file prior to  encounter.    Current Outpatient Medications on File Prior to Encounter  Medication Sig Dispense Refill  . acetaminophen (TYLENOL) 500 MG tablet Take 500-1,000 mg by mouth every 6 (six) hours as needed for mild pain or headache.    Marland Kitchen amLODipine (NORVASC) 5 MG tablet TAKE 1 TABLET BY MOUTH EVERY DAY (Patient taking differently: Take 5 mg by mouth daily. )  30 tablet 1  . aspirin EC 81 MG EC tablet Take 1 tablet (81 mg total) by mouth daily.    . fluticasone (FLONASE) 50 MCG/ACT nasal spray Place 1-2 sprays into both nostrils daily as needed for allergies or rhinitis.    . hydrALAZINE (APRESOLINE) 25 MG tablet Take 1 tablet (25 mg total) by mouth 3 (three) times daily. 90 tablet 2  . loratadine (CLARITIN) 10 MG tablet Take 10 mg by mouth daily as needed for allergies or rhinitis.     . macitentan (OPSUMIT) 10 MG tablet Take 1 tablet (10 mg total) by mouth daily. 30 tablet 1  . meloxicam (MOBIC) 7.5 MG tablet Take 7.5 mg by mouth daily as needed for pain.    . metoprolol succinate (TOPROL-XL) 25 MG 24 hr tablet Take 1 tablet (25 mg total) by mouth daily. 90 tablet 3  . OXYGEN Inhale 3 L/min into the lungs continuous.    . potassium chloride (KLOR-CON M10) 10 MEQ tablet Take 10 mEq by mouth 2 (two) times daily.    . sildenafil (REVATIO) 20 MG tablet TAKE 1 TABLET BY MOUTH THREE TIMES A DAY (Patient taking differently: Take 20 mg by mouth 3 (three) times daily. ) 90 tablet 2  . spironolactone (ALDACTONE) 50 MG tablet Take 1 tablet (50 mg total) by mouth daily. 30 tablet 2  . torsemide (DEMADEX) 20 MG tablet TAKE 1 TABLET (20 MG TOTAL) BY MOUTH 2 (TWO) TIMES DAILY WITH A MEAL. 60 tablet 3  . acetaminophen (TYLENOL) 325 MG tablet Take 2 tablets (650 mg total) by mouth every 4 (four) hours as needed for headache or mild pain. (Patient not taking: Reported on 10/29/2019)    . budesonide (PULMICORT) 0.25 MG/2ML nebulizer solution Take 2 mLs (0.25 mg total) by nebulization 2 (two) times daily. 60 mL 12   Allergies: Allergies as of 10/29/2019  . (No Known Allergies)   Past Medical History:  Diagnosis Date  . Anemia   . Cancer ([place])    cervical  1970s   . Chronic kidney disease   . COPD (chronic obstructive pulmonary disease) ([place])   . GERD (gastroesophageal reflux disease)   . Hypertension    Family History:  HTN - Mother, father, brother Heart  disease - Mother, father, maternal grandfather, maternal grandmother HLD - Mother Stroke - Brother Cancer - Sister Dementia - Sister  Social History: She lives by herself at home but has friends that she can call for help. She is the only sibling and has a half sister who has dementia. 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 Former smoker, quit 2 months ago.  Review of Systems: Review of Systems  Constitutional: Positive for chills. Negative for fever.  HENT: Negative for congestion and sore throat.   Eyes: Negative for blurred vision and double vision.  Respiratory: Positive for shortness of breath and wheezing. Negative for cough.   Cardiovascular: Positive for orthopnea and leg swelling. Negative for chest pain.  Gastrointestinal: Positive for constipation. Negative for abdominal pain, diarrhea, nausea and vomiting.       Abdominal distension  Genitourinary:  Positive for frequency. Negative for dysuria.  Musculoskeletal: Positive for falls.  Skin: Negative.   Neurological: Negative for dizziness and headaches.   Physical Exam: Blood pressure 122/72, pulse 84, temperature (!) 97.5 F (36.4 C), temperature source Oral, resp. rate (!) 24, SpO2 92 %. Physical Exam Vitals signs and nursing note reviewed.  Constitutional:      General: She is not in acute distress.    Comments: On 3L [place]  HENT:     Head: Normocephalic and atraumatic.  Neck:     Musculoskeletal: Normal range of motion and neck supple.  Cardiovascular:     Rate and Rhythm: Normal rate and regular rhythm.     Heart sounds: Normal heart sounds.  Pulmonary:     Effort: Tachypnea present. No accessory muscle usage or respiratory distress.     Breath sounds: Decreased air movement present. No wheezing, rhonchi or rales.     Comments: Crackles at the bilateral bases. Abdominal:     General: Bowel sounds are normal. There is distension.     Palpations: Abdomen is soft.     Tenderness: There is no abdominal tenderness.   Musculoskeletal:     Right lower leg: 2+ Pitting Edema present.     Left lower leg: 2+ Pitting Edema present.     Comments: Pitting edema to mid thigh  Skin:    General: Skin is warm and dry.  Neurological:     General: No focal deficit present.     Mental Status: She is alert and oriented to person, place, and time.  Psychiatric:        Mood and Affect: Mood normal.    EKG: personally reviewed my interpretation is sinus rhythm, normal rate, no ST segment changes.   CXR: personally reviewed my interpretation is cardiomegaly present, no gross opacities or consolidations, no bony abnormalities, areas of interstitial fibrosis  Assessment & Plan by Problem: Active Problems:   Acute diastolic congestive heart failure [facility])  Vanessa Williamson is a 63 year old F with significant PMH of HFpEF, WHO group 1 pulmonary pretension on OPSUMIT and Uptravi, morbid obesity, chronic kidney disease stage 3, COPD, hypertension, GERD, and iron deficiency anemia who presented with worsening dyspnea.  Acute on chronic diastolic heart failure with WHO group 1 PAH Most recent echo 06/2019 with LVEF 50-55%, impaired LV relaxation, RV with moderately reduced systolic function and volume/pressure overload, right and left atrial dilation, and moderate to severe MR associated with pulmonary HTN and PV pressure/volume overload. RSVP 65-25mmHg. BNP mildly elevated to 125. Troponin not elevated. On exam, pt is edematous over lower extremities and abdomen. Currently requiring 3L supplemental oxygen, with crackles on ausculation. Suspect decompensation related to pt not taking full doses of her medications for the past week, but does not appear to be in cardiogenic shock at this time. - will check lactate - diuresis with furosemide 60mg  IV q6 x 2 - continue sildenafil 20mg  TID - holding home spironolactone  - will get abdomen CT to evaluation her distension  - continue cardiac monitoring and pulse ox - strict I/Os  Acute on  chronic CKD stage III Cr on admission 3.12 with a baseline of 1.18. Worsening renal function with K of 6.0 and BUN of 94. Likely due to cardiorenal syndrome in the setting of her decompensated heart failure.  - hopeful to improve with diuresis - will continue to monitor with evening and morning BMP  Hyperkalemia secondary to above K on admission 6.0. No changes on EKG. Received  D50, insulin and Lokelma in ED. Repeat 5.9 - order for additional dose of Lokelma - ordered lactulose 30g PO to help with pt's constipation and to help excrete K - f/u BMP at 2200  Hypertension Pt with low-normal pressures 110s/60s - holding home amlodipine, hydralazine, and metoprolol  Iron deficiency anemia Hgb 9.7 today. - feraheme 510mg  IV weekly for 2 doses   Diet: low-Na diet Fluids: none DVT ppx: heparin 5,000 units subQ q8h CODE STATUS: DNR   Dispo: Admit patient to Inpatient with expected length of stay greater than 2 midnights.  Signed: [name], MD 10/29/2019, 7:26 PM  Pager: [phone] Internal Medicine Teaching Service

## 2019-10-29 NOTE — ED Provider Notes (Signed)
[facility] EMERGENCY DEPARTMENT Provider Note   CSN: [id number] Arrival date & time: 10/29/19  1320    History   Chief Complaint Chief Complaint  Patient presents with  . Shortness of Breath    HPI Vanessa Williamson is a 63 y.o. female with history significant for CKD, COPD, GERD, hypertension, heart failure presents for evaluation of shortness of breath.  Patient states she has had progressive worsening of her shortness of breath.  She is on 2-3 L oxygen via nasal cannula at baseline for heart failure.  She was seen by Dr. Irven Shelling office yesterday for CHF exacerbation and pulmonary hypertension with RV failure.  Was admitted 1 month ago for similar symptoms.  She had negative VQ scan at that time.  Lasix was changed to torsemide.  Patient has had worsening shortness of breath as well as weight gain.  She has not weighed herself however she has gone to a size 14 to a size 22 due to her fluid overload.  Patient admits to progressive PND.  States she is unable to lay flat and must sleep in a recliner.  She has fallen twice trying to get out of a chair due to generalized weakness which patient feels is related to her fluid gain.  Patient states that she has tried increasing her torsemide which has not helped to alleviate her fluid gain.  Denies fever, chills, nausea, vomiting, headache, neck pain, neck stiffness, hemoptysis, abdominal pain, dysuria, constipation.  Admits to progressive swelling from her feet into her mid abdomen. Denies hitting head, LOC, anticoagulation. Slid off chair at midnight due to weakness. Had to call for help from neighbor due to unable to get off of floor. Was on floor for approximately 30 minutes.  Left heart cath 09/14/2019 with mild to moderate CAD involving LAD at 30 to 40% stenosis, moderate to severe pulmonary hypertension.  Previous provider's note from yesterday they feel her weight has remained stable as well as her leg edema.  She did have dyspnea  while there was unable to ambulate secondary to her dyspnea.        HPI  Past Medical History:  Diagnosis Date  . Anemia   . Cancer ([place])    cervical  1970s   . Chronic kidney disease   . COPD (chronic obstructive pulmonary disease) ([place])   . GERD (gastroesophageal reflux disease)   . Hypertension     Patient Active Problem List   Diagnosis Date Noted  . (HFpEF) heart failure with preserved ejection fraction ([facility]) 09/10/2019  . Anemia in chronic kidney disease (CKD) 07/20/2019  . Incarcerated ventral hernia 10/16/2017  . Ventral hernia 02/03/2014  . Unspecified vitamin D deficiency 12/11/2013  . Morbid obesity with BMI of 40.0-44.9, adult ([place]) 12/10/2013  . Tobacco use disorder 12/10/2013  . COPD (chronic obstructive pulmonary disease) ([place]) 08/19/2012  . HTN (hypertension) 03/08/2012  . GERD (gastroesophageal reflux disease) 03/08/2012  . Allergic rhinitis 03/08/2012    Past Surgical History:  Procedure Laterality Date  . BREAST BIOPSY Left    benign  . CERVIX LESION DESTRUCTION     frozen  . COLONOSCOPY  03/09/2008  . EYE SURGERY     laser  . FRACTURE SURGERY    . HUMERUS FRACTURE SURGERY     left  2005  . RIGHT/LEFT HEART CATH AND CORONARY ANGIOGRAPHY N/A 09/14/2019   Procedure: RIGHT/LEFT HEART CATH AND CORONARY ANGIOGRAPHY;  Surgeon: Adrian Prows, MD;  Location: [facility];  Service: Cardiovascular;  Laterality: N/A;  . VENTRAL HERNIA REPAIR N/A 10/16/2017   Procedure: [place];  Surgeon: Alphonsa Overall, MD;  Location: [facility];  Service: General;  Laterality: N/A;     OB History   No obstetric history on file.      Home Medications    Prior to Admission medications   Medication Sig Start Date End Date Taking? Authorizing Provider  acetaminophen (TYLENOL) 325 MG tablet Take 2 tablets (650 mg total) by mouth every 4 (four) hours as needed for headache or mild pain. 09/16/19   Adrian Prows, MD  amLODipine (NORVASC) 5 MG  tablet TAKE 1 TABLET BY MOUTH EVERY DAY 10/11/19   Miquel Dunn, NP  aspirin EC 81 MG EC tablet Take 1 tablet (81 mg total) by mouth daily. 09/17/19   Adrian Prows, MD  budesonide (PULMICORT) 0.25 MG/2ML nebulizer solution Take 2 mLs (0.25 mg total) by nebulization 2 (two) times daily. Patient not taking: Reported on 09/29/2019 09/16/19   Adrian Prows, MD  hydrALAZINE (APRESOLINE) 25 MG tablet Take 1 tablet (25 mg total) by mouth 3 (three) times daily. 09/16/19 12/15/19  Adrian Prows, MD  loratadine (CLARITIN) 10 MG tablet Take 10 mg by mouth daily.    [provider]  macitentan (OPSUMIT) 10 MG tablet Take 1 tablet (10 mg total) by mouth daily. 10/04/19   Miquel Dunn, NP  metoprolol succinate (TOPROL-XL) 25 MG 24 hr tablet Take 1 tablet (25 mg total) by mouth daily. 10/28/19   Miquel Dunn, NP  sildenafil (REVATIO) 20 MG tablet TAKE 1 TABLET BY MOUTH THREE TIMES A DAY 10/11/19   Miquel Dunn, NP  spironolactone (ALDACTONE) 50 MG tablet Take 1 tablet (50 mg total) by mouth daily. 10/28/19   Miquel Dunn, NP  torsemide (DEMADEX) 20 MG tablet TAKE 1 TABLET (20 MG TOTAL) BY MOUTH 2 (TWO) TIMES DAILY WITH A MEAL. 10/11/19   Miquel Dunn, NP    Family History Family History  Problem Relation Age of Onset  . Hypertension Mother   . Heart disease Mother   . Hyperlipidemia Mother   . Heart disease Father   . Hypertension Father   . Heart disease Maternal Grandmother   . Heart disease Maternal Grandfather   . Cancer Sister   . Stroke Brother   . Hypertension Brother   . Colon cancer Neg Hx   . Colon polyps Neg Hx   . Esophageal cancer Neg Hx   . Stomach cancer Neg Hx   . Rectal cancer Neg Hx    Social History Social History   Tobacco Use  . Smoking status: Former Smoker    Packs/day: 0.25    Types: Cigarettes    Quit date: 08/2019    Years since quitting: 0.1  . Smokeless tobacco: Never Used  . Tobacco comment: been smoking since  age 54  Substance Use Topics  . Alcohol use: Yes    Comment: rarely  . Drug use: No    Comment: 2005   Allergies   Patient has no known allergies.  Review of Systems Review of Systems  Constitutional: Negative.   HENT: Negative.   Respiratory: Positive for shortness of breath. Negative for apnea, cough, choking, chest tightness, wheezing and stridor.   Cardiovascular: Positive for leg swelling. Negative for chest pain and palpitations.  Gastrointestinal: Negative.   Genitourinary: Negative.   Musculoskeletal: Negative.   Skin: Negative.   Neurological: Negative.   All other systems  reviewed and are negative.  Physical Exam Updated Vital Signs BP 116/67   Pulse 76   Temp (!) 97.5 F (36.4 C) (Oral)   Resp 16   SpO2 97%   Physical Exam Vitals signs and nursing note reviewed.  Constitutional:      General: She is not in acute distress.    Appearance: She is well-developed. She is ill-appearing. She is not toxic-appearing.     Comments: Chronically ill-appearing.  HENT:     Head: Normocephalic and atraumatic.  Eyes:     Pupils: Pupils are equal, round, and reactive to light.  Neck:     Musculoskeletal: Full passive range of motion without pain, normal range of motion and neck supple.     Vascular: JVD present.     Comments: No neck stiffness or neck rigidity.  JVD present. Cardiovascular:     Rate and Rhythm: Normal rate.     Pulses: Normal pulses.     Heart sounds: Normal heart sounds.  Pulmonary:     Effort: Pulmonary effort is normal. No respiratory distress.     Breath sounds: Normal air entry. Rales present.     Comments: Rales to lower lobes. 4 L [place] oxygen.  Speaks in short sentences. Abdominal:     General: Bowel sounds are normal. There is distension.     Tenderness: There is no abdominal tenderness.     Comments: Distended however nontender.  Old surgical scars to abdomen.  Musculoskeletal: Normal range of motion.       Feet:     Comments: Moves all 4  extremities without difficulty.  Tenderness to patient's left lateral malleolus however full range of motion with flexion, extension.  No evidence of joint effusion however difficult to assess due to patient's pitting edema. No bony tenderness to foot, midshaft, proximal tibia/fibula.  She has 2-3+ pitting edema to mid thighs.  Skin:    General: Skin is warm and dry.     Capillary Refill: Capillary refill takes less than 2 seconds.     Comments: No rashes or lesions.  Neurological:     General: No focal deficit present.     Mental Status: She is alert.     Cranial Nerves: Cranial nerves are intact.     Sensory: Sensation is intact.     Motor: Motor function is intact.     Comments: Negative finger-nose, heel-to-shin.  Cranial nerves II through XII grossly intact.    ED Treatments / Results  Labs (all labs ordered are listed, but only abnormal results are displayed) Labs Reviewed  BASIC METABOLIC PANEL - Abnormal; Notable for the following components:      Result Value   Potassium 6.0 (*)    Chloride 113 (*)    CO2 18 (*)    BUN 94 (*)    Creatinine, Ser 3.12 (*)    Calcium 8.8 (*)    GFR calc non Af Amer 15 (*)    GFR calc Af Amer 17 (*)    All other components within normal limits  CBC - Abnormal; Notable for the following components:   WBC 3.9 (*)    RBC 2.74 (*)    Hemoglobin 9.7 (*)    HCT 30.0 (*)    MCV 109.5 (*)    MCH 35.4 (*)    RDW 16.5 (*)    All other components within normal limits  SARS CORONAVIRUS 2 (TAT 6-24 HRS)  BRAIN NATRIURETIC PEPTIDE  TROPONIN I (HIGH SENSITIVITY)  EKG EKG Interpretation  Date/Time:  Friday October 29 2019 13:27:47 EST Ventricular Rate:  80 PR Interval:    QRS Duration: 96 QT Interval:  399 QTC Calculation: 461 R Axis:   69 Text Interpretation: Sinus rhythm Nonspecific T abnormalities, lateral leads No significant change since last tracing Confirmed by Gareth Morgan [phone]) on 10/29/2019 2:15:50 PM  Radiology Dg Chest  2 View  Result Date: 10/29/2019 CLINICAL DATA:  Shortness of breath. History of congestive heart failure. EXAM: CHEST - 2 VIEW COMPARISON:  September 15, 2019 chest radiograph; chest CT September 13, 2019 FINDINGS: There is underlying fibrotic type change. There is atelectatic change in portions of each upper lobe and left base. There is no frank edema or consolidation. There is cardiomegaly with pulmonary vascularity within normal limits. No adenopathy. There is a total shoulder replacement on the left. IMPRESSION: Underlying interstitial fibrosis with areas of atelectatic change. No frank edema or consolidation. Cardiomegaly, unchanged.  No adenopathy evident. Electronically Signed   By: Lowella Grip III M.D.   On: 10/29/2019 15:21   Dg Ankle Complete Left  Result Date: 10/29/2019 CLINICAL DATA:  Pain following fall EXAM: LEFT ANKLE COMPLETE - 3+ VIEW COMPARISON:  None. FINDINGS: Frontal, oblique, and lateral views were obtained. There is generalized soft tissue swelling. There is no demonstrable fracture or joint effusion. There is no joint space narrowing or erosion. The ankle mortise appears intact. There is a small inferior calcaneal spur. IMPRESSION: Soft tissue swelling. No appreciable fracture or joint space narrowing. Ankle mortise appears intact. There is a small inferior calcaneal spur. Electronically Signed   By: Lowella Grip III M.D.   On: 10/29/2019 15:22    Procedures Procedures (including critical care time)  Medications Ordered in ED Medications  sodium chloride flush (NS) 0.9 % injection 3 mL (has no administration in time range)  insulin aspart (novoLOG) injection 5 Units (has no administration in time range)  dextrose 50 % solution 50 mL (has no administration in time range)  sodium zirconium cyclosilicate (LOKELMA) packet 10 g (has no administration in time range)   Initial Impression / Assessment and Plan / ED Course  I have reviewed the triage vital signs and the  nursing notes.  Pertinent labs & imaging results that were available during my care of the patient were reviewed by me and considered in my medical decision making (see chart for details).  63 year old female appears chronically ill presents for evaluation of shortness of breath.  She has had recent weight gain however has not weighed herself.  States she is gone from a size 14 to a size 22 and pants over the last 2 weeks.  She has had to go up on her oxygen and she is currently on 4 L via nasal cannula.  Admits to PND and orthopnea.  She is followed by Dr. Einar Gip with cardiology whom she saw the nurse practitioner yesterday.  Patient states she feels progressive weakness secondary to what she states is her weight gain.  Denies hitting head, LOC or anticoagulation.  Does admit to some minor left lateral malleolus pain since her fall.  Has had 2-3+ pitting edema to her mid thighs.  She does have distended abdomen however this is nontender.  I have low suspicion for SBP.  She does have history of early liver failure per Ct in 2018.  Given her distended abdomen will obtain CT scan to rule out AP pathology of abd distension.  Labs obtained which show elevated creatinine at 3.12  previous at 1.18, potassium at 6.0 without EKG changes, Will give Lokelma, insulin for hyper K. EKG without ST/T changes. CBC without leukocytosis, Hgb 9.7 improved from previous at 7.1 BNP pending Trop 13 COVID pending  Patient pending BNP, imaging. Suspect patient will likley need admission for AKI suspected related to her diuretic use and CHF exacerbation/ Fluid overload however ultimate treatment and disposition to be made by oncoming provider [place], PA-C in conjunction with Cardiology recommendation.  Plan to contact Cardiology, Dr. Nadyne Coombes once chest xray and BNP results. Will hold on Lasix given AKI until discuss with Cardiology plan.       Final Clinical Impressions(s) / ED Diagnoses   Final diagnoses:  Acute on  chronic congestive heart failure, unspecified heart failure type ([place])  Hyperkalemia  AKI (acute kidney injury) [facility])    ED Discharge Orders    None       Santonio Speakman A, PA-C 10/29/19 1530    Gareth Morgan, MD 10/29/19 2105

## 2019-10-29 NOTE — Telephone Encounter (Signed)
Patient says she is taking only the [place]. She is not taking the Opsumit and is unsure if it has come in the mail and been misplaced.

## 2019-10-29 NOTE — Telephone Encounter (Signed)
Telephone encounter:  Reason for call: falling out of chair, falling when trying to walk. Retaining fluid in from chest down. Fatigue and SOB. Confusion/ disoriented. Her caregiver Sharyn Lull feels she needs to be admitted to hospital or rehab facility. Please advise  Usual provider: Jeri Lager, NP  Last office visit: 10/28/19   Next office visit: 11/11/19   Last hospitalization: 09/10/19   Current Outpatient Medications on File Prior to Visit  Medication Sig Dispense Refill  . acetaminophen (TYLENOL) 325 MG tablet Take 2 tablets (650 mg total) by mouth every 4 (four) hours as needed for headache or mild pain.    Marland Kitchen amLODipine (NORVASC) 5 MG tablet TAKE 1 TABLET BY MOUTH EVERY DAY 30 tablet 1  . aspirin EC 81 MG EC tablet Take 1 tablet (81 mg total) by mouth daily.    . budesonide (PULMICORT) 0.25 MG/2ML nebulizer solution Take 2 mLs (0.25 mg total) by nebulization 2 (two) times daily. (Patient not taking: Reported on 09/29/2019) 60 mL 12  . hydrALAZINE (APRESOLINE) 25 MG tablet Take 1 tablet (25 mg total) by mouth 3 (three) times daily. 90 tablet 2  . loratadine (CLARITIN) 10 MG tablet Take 10 mg by mouth daily.    . macitentan (OPSUMIT) 10 MG tablet Take 1 tablet (10 mg total) by mouth daily. 30 tablet 1  . metoprolol succinate (TOPROL-XL) 25 MG 24 hr tablet Take 1 tablet (25 mg total) by mouth daily. 90 tablet 3  . sildenafil (REVATIO) 20 MG tablet TAKE 1 TABLET BY MOUTH THREE TIMES A DAY 90 tablet 2  . spironolactone (ALDACTONE) 50 MG tablet Take 1 tablet (50 mg total) by mouth daily. 30 tablet 2  . torsemide (DEMADEX) 20 MG tablet TAKE 1 TABLET (20 MG TOTAL) BY MOUTH 2 (TWO) TIMES DAILY WITH A MEAL. 60 tablet 3   No current facility-administered medications on file prior to visit.

## 2019-10-30 ENCOUNTER — Inpatient Hospital Stay (HOSPITAL_COMMUNITY): Payer: No Typology Code available for payment source

## 2019-10-30 DIAGNOSIS — I2721 Secondary pulmonary arterial hypertension: Secondary | ICD-10-CM | POA: Diagnosis present

## 2019-10-30 DIAGNOSIS — D509 Iron deficiency anemia, unspecified: Secondary | ICD-10-CM

## 2019-10-30 DIAGNOSIS — K219 Gastro-esophageal reflux disease without esophagitis: Secondary | ICD-10-CM

## 2019-10-30 DIAGNOSIS — E875 Hyperkalemia: Secondary | ICD-10-CM

## 2019-10-30 DIAGNOSIS — N183 Chronic kidney disease, stage 3 unspecified: Secondary | ICD-10-CM

## 2019-10-30 LAB — CBC
HCT: 29.6 % — ABNORMAL LOW (ref 36.0–46.0)
Hemoglobin: 9.7 g/dL — ABNORMAL LOW (ref 12.0–15.0)
MCH: 34 pg (ref 26.0–34.0)
MCHC: 32.8 g/dL (ref 30.0–36.0)
MCV: 103.9 fL — ABNORMAL HIGH (ref 80.0–100.0)
Platelets: 165 10*3/uL (ref 150–400)
RBC: 2.85 MIL/uL — ABNORMAL LOW (ref 3.87–5.11)
RDW: 15.9 % — ABNORMAL HIGH (ref 11.5–15.5)
WBC: 4.3 10*3/uL (ref 4.0–10.5)
nRBC: 0 % (ref 0.0–0.2)

## 2019-10-30 LAB — BASIC METABOLIC PANEL
Anion gap: 9 (ref 5–15)
Anion gap: 8 (ref 5–15)
BUN: 92 mg/dL — ABNORMAL HIGH (ref 8–23)
BUN: 95 mg/dL — ABNORMAL HIGH (ref 8–23)
CO2: 15 mmol/L — ABNORMAL LOW (ref 22–32)
CO2: 19 mmol/L — ABNORMAL LOW (ref 22–32)
Calcium: 8.9 mg/dL (ref 8.9–10.3)
Calcium: 8.9 mg/dL (ref 8.9–10.3)
Chloride: 116 mmol/L — ABNORMAL HIGH (ref 98–111)
Chloride: 112 mmol/L — ABNORMAL HIGH (ref 98–111)
Creatinine, Ser: 2.96 mg/dL — ABNORMAL HIGH (ref 0.44–1.00)
Creatinine, Ser: 3.19 mg/dL — ABNORMAL HIGH (ref 0.44–1.00)
GFR calc Af Amer: 19 mL/min — ABNORMAL LOW (ref >60)
GFR calc Af Amer: 17 mL/min — ABNORMAL LOW (ref >60)
GFR calc non Af Amer: 16 mL/min — ABNORMAL LOW (ref >60)
GFR calc non Af Amer: 15 mL/min — ABNORMAL LOW (ref >60)
Glucose, Bld: 88 mg/dL (ref 70–99)
Glucose, Bld: 97 mg/dL (ref 70–99)
Potassium: 5.9 mmol/L — ABNORMAL HIGH (ref 3.5–5.1)
Potassium: 5.8 mmol/L — ABNORMAL HIGH (ref 3.5–5.1)
Sodium: 140 mmol/L (ref 135–145)
Sodium: 139 mmol/L (ref 135–145)

## 2019-10-30 LAB — COMPREHENSIVE METABOLIC PANEL
ALT: 9 U/L (ref 0–44)
AST: 14 U/L — ABNORMAL LOW (ref 15–41)
Albumin: 2.9 g/dL — ABNORMAL LOW (ref 3.5–5.0)
Alkaline Phosphatase: 28 U/L — ABNORMAL LOW (ref 38–126)
Anion gap: 8 (ref 5–15)
BUN: 95 mg/dL — ABNORMAL HIGH (ref 8–23)
CO2: 17 mmol/L — ABNORMAL LOW (ref 22–32)
Calcium: 8.7 mg/dL — ABNORMAL LOW (ref 8.9–10.3)
Chloride: 113 mmol/L — ABNORMAL HIGH (ref 98–111)
Creatinine, Ser: 3.24 mg/dL — ABNORMAL HIGH (ref 0.44–1.00)
GFR calc Af Amer: 17 mL/min — ABNORMAL LOW (ref >60)
GFR calc non Af Amer: 14 mL/min — ABNORMAL LOW (ref >60)
Glucose, Bld: 103 mg/dL — ABNORMAL HIGH (ref 70–99)
Potassium: 5.5 mmol/L — ABNORMAL HIGH (ref 3.5–5.1)
Sodium: 138 mmol/L (ref 135–145)
Total Bilirubin: 0.7 mg/dL (ref 0.3–1.2)
Total Protein: 6.2 g/dL — ABNORMAL LOW (ref 6.5–8.1)

## 2019-10-30 LAB — MAGNESIUM: Magnesium: 2.1 mg/dL (ref 1.7–2.4)

## 2019-10-30 LAB — IRON AND TIBC
Iron: 268 ug/dL — ABNORMAL HIGH (ref 28–170)
Saturation Ratios: 120 % — ABNORMAL HIGH (ref 10.4–31.8)
TIBC: 224 ug/dL — ABNORMAL LOW (ref 250–450)

## 2019-10-30 LAB — LACTIC ACID, PLASMA: Lactic Acid, Venous: 1.1 mmol/L (ref 0.5–1.9)

## 2019-10-30 LAB — ECHOCARDIOGRAM COMPLETE
Height: 64 in
Weight: 3236.81 oz

## 2019-10-30 LAB — FERRITIN: Ferritin: 521 ng/mL — ABNORMAL HIGH (ref 11–307)

## 2019-10-30 MED ORDER — LIDOCAINE 5 % EX PTCH: 1.0000 | MEDICATED_PATCH | Freq: Every day | CUTANEOUS | Status: DC | PRN

## 2019-10-30 MED ORDER — FUROSEMIDE 10 MG/ML IJ SOLN: 80.0000 mg | Freq: Four times a day (QID) | INTRAMUSCULAR | Status: DC

## 2019-10-30 MED ORDER — FUROSEMIDE 10 MG/ML IJ SOLN
60.0000 mg | Freq: Four times a day (QID) | INTRAMUSCULAR | Status: DC
  Administered 2019-10-30 – 2019-10-31 (×3): 60 mg via INTRAVENOUS
  Filled 2019-10-30 (×4): qty 6

## 2019-10-30 MED ORDER — ACETAMINOPHEN 325 MG PO TABS
650.0000 mg | ORAL_TABLET | Freq: Four times a day (QID) | ORAL | Status: DC | PRN
  Administered 2019-10-30 – 2019-11-13 (×10): 650 mg via ORAL
  Filled 2019-10-30 (×10): qty 2

## 2019-10-30 MED ORDER — DICLOFENAC SODIUM 1 % TD GEL
2.0000 g | Freq: Three times a day (TID) | TRANSDERMAL | Status: DC | PRN
  Filled 2019-10-30: qty 100

## 2019-10-30 MED ORDER — SODIUM ZIRCONIUM CYCLOSILICATE 5 G PO PACK
5.0000 g | PACK | Freq: Three times a day (TID) | ORAL | Status: DC
  Administered 2019-10-30 – 2019-11-01 (×6): 5 g via ORAL
  Filled 2019-10-30 (×8): qty 1

## 2019-10-30 NOTE — Plan of Care (Signed)

## 2019-10-30 NOTE — Progress Notes (Signed)
Paracentesis will be done tomorrow morning per Dr. Vernard Gambles.  Idolina Primer, RN

## 2019-10-30 NOTE — Progress Notes (Signed)
  Echocardiogram 2D Echocardiogram has been performed.  Vanessa Williamson 10/30/2019, 10:15 AM

## 2019-10-30 NOTE — Progress Notes (Signed)
Subjective: Pt seen at the bedside this morning. Appears tired and states she's been having trouble sleeping due to back pain. Currently on 5L [place]. No acute concerns.  Objective:  Vital signs in last 24 hours: Vitals:   10/30/19 0049 10/30/19 0509 10/30/19 0802 10/30/19 0841  BP: (!) 146/79 112/68 125/61   Pulse: 87 83 78   Resp: (!) 22  18   Temp: 97.8 F (36.6 C) 97.6 F (36.4 C) 97.9 F (36.6 C)   TempSrc: Oral Oral Oral   SpO2:  99% 100% 94%  Weight: 91.8 kg     Height: 5\' 4"  (1.626 m)      Physical Exam Vitals signs and nursing note reviewed.  Constitutional:      General: She is not in acute distress.    Appearance: She is ill-appearing (chronically).  Cardiovascular:     Rate and Rhythm: Normal rate and regular rhythm.  Pulmonary:     Effort: Tachypnea present. No respiratory distress.     Breath sounds: Decreased air movement present.     Comments: Diffuse crackles Musculoskeletal:     Right lower leg: Edema present.     Left lower leg: Edema present.  Skin:    General: Skin is warm and dry.     Comments: Pt's back without erythema, skin breakdown, or sacral ulcer. No tenderness to palpation.  Neurological:     Mental Status: She is alert.    Assessment/Plan:  Principal Problem:   Acute on chronic diastolic heart failure (HCC) Active Problems:   Anemia in chronic kidney disease (CKD)   Pulmonary arterial hypertension ([place])   [name] is a 63 year old F with significant PMH of HFpEF, WHO group 1 pulmonary pretension on OPSUMIT and Uptravi, morbid obesity, chronic kidney disease stage 3, COPD, hypertension, GERD, and iron deficiency anemia who presented with fluid overload and acute on chronic RV failure.   Acute on chronic diastolic heart failure with WHO group 1 PAH Pt remains fluid overloaded on exam today with unchanged LE edema and abdominal distension. Most recent echo 06/2019 with LVEF 50-55%, impaired LV relaxation, RV with moderately reduced  systolic function and volume/pressure overload, right and left atrial dilation, and moderate to severe MR associated with pulmonary HTN and PV pressure/volume overload. RSVP 65-61mmHg.  Abdominal CT remarkable for large amount of abdominopelvic ascites and diffuse anasarca. - does not appear to be in cardiogenic shock at this time, normal BP and lactate normal x2 - currently on 5L  - continue cardiac monitoring and pulse ox  Cardiology consulted, appreciated their recommendations - diuresis with furosemide 60mg  IV q6h - if no adequately diurese, will need PICC line placed to monitor SVO2 and start dopamine - pt's overall prognosis is poor, consult to palliative care for defining goals of care - it is vital that strict I/Os are monitored for this patient - follow-up echo - continue sildenafil 20mg  TID - holding home spironolactone    Acute on chronic CKD stage III Cr minimally improved to 2.96, on admission 3.12 (baseline of 1.18). Likely due to cardiorenal syndrome in the setting of her decompensated heart failure.  - hopeful to improve with diuresis - will continue to monitor with follow-up BMPs  Hyperkalemia secondary to above K 5.9 this morning (6.0 on admission). Pt having bowel movements with lactulose. - order for Lokelma 5g TID - f/u BMP at 1800  Hypertension - holding home amlodipine, hydralazine, and metoprolol  Iron deficiency anemia Hgb 9.7 today. - received  feraheme 510mg  for 1 dose yesterday  Diet: low-Na diet Fluids: none DVT ppx: heparin 5,000 units subQ q8h CODE STATUS: DNR  Dispo: Anticipated discharge pending clinical course.   Vanessa Horns, MD 10/30/2019, 12:08 PM Pager: [phone]

## 2019-10-30 NOTE — Progress Notes (Addendum)
Subjective:  Complains of back pain. She is tearful, says she is afraid that she is going to be dependent, as she is not able to do any activity without assistance.  She is unequivocal that she does not want any resuscitation measures to "keep her alive"  Objective:  Vital Signs in the last 24 hours: Temp:  [97.5 F (36.4 C)-97.9 F (36.6 C)] 97.9 F (36.6 C) (11/07 0802) Pulse Rate:  [74-87] 78 (11/07 0802) Resp:  [16-24] 18 (11/07 0802) BP: (100-146)/(61-80) 125/61 (11/07 0802) SpO2:  [91 %-100 %] 94 % (11/07 0841) FiO2 (%):  [93 %] 93 % (11/06 1544) Weight:  [91.8 kg] 91.8 kg (11/07 0049)  Intake/Output from previous day: No intake/output data recorded.  Physical Exam Constitutional: She is oriented to person, place, and time. She appears well-developed. She appears distressed (Mild respiratory distress).  Chronically ill appearing   HENT:  Head: Normocephalic and atraumatic.  Eyes: Pupils are equal, round, and reactive to light. Conjunctivae are normal.  Neck: JVD present.  Cardiovascular: Normal rate, regular rhythm and intact distal pulses.  Murmur heard.  Systolic murmur is present with a grade of 2/6 at the upper left sternal border. Loud P2  Pulmonary/Chest: Effort normal and breath sounds normal. She has no wheezes. She has no rales.  Abdominal: Soft. Bowel sounds are normal.Large volume ascites. Musculoskeletal:        General: Edema (3+) present.  Lymphadenopathy:    She has no cervical adenopathy.  Neurological: She is alert and oriented to person, place, and time. No cranial nerve deficit.  Skin: Skin is warm and dry.  Psychiatric: She has a normal mood and affect.  Nursing note and vitals reviewed.  Lab Results: BMP Recent Labs    10/29/19 1755 10/29/19 1855 10/29/19 2215 10/30/19 0328  NA 138  --  139 140  K 5.9*  --  5.9* 5.9*  CL 113*  --  114* 116*  CO2 18*  --  18* 15*  GLUCOSE 103*  --  108* 88  BUN 92*  --  96* 92*  CREATININE 2.93*  3.13* 3.08* 2.96*  CALCIUM 8.7*  --  8.6* 8.9  GFRNONAA 16* 15* 15* 16*  GFRAA 19* 17* 18* 19*    CBC Recent Labs  Lab 10/30/19 0518  WBC 4.3  RBC 2.85*  HGB 9.7*  HCT 29.6*  PLT 165  MCV 103.9*  MCH 34.0  MCHC 32.8  RDW 15.9*    BNP (last 3 results) Recent Labs    09/12/19 0623 09/13/19 0629 10/29/19 1358  BNP 715.6* 636.0* 125.0*    Lipid Panel     Component Value Date/Time   CHOL 157 07/20/2018 1452   TRIG 98 07/20/2018 1452   HDL 42 07/20/2018 1452   CHOLHDL 3.7 07/20/2018 1452   CHOLHDL 4.3 12/10/2013 0856   VLDL 18 12/10/2013 0856   LDLCALC 95 07/20/2018 1452     Hepatic Function Panel Recent Labs    07/20/19 1229 08/17/19 1318 09/10/19 1706  PROT  --   --  6.7  ALBUMIN 3.3* 3.2* 3.1*  AST  --   --  12*  ALT  --   --  9  ALKPHOS  --   --  49  BILITOT  --   --  1.1   CARDIAC STUDIES:  Telemetry 10/30/2019: 5 beat NSVT  EKG 10/29/2019: Sinus rhythm. Nonspecific ST-T changes.  Echocardiogram 07/20/2019:  1. The left ventricle has low normal systolic function, with an ejection fraction  of 50-55%. The cavity size was mildly dilated. Left ventricular diastolic Doppler parameters are consistent with impaired relaxation. There is right ventricular volume and pressure overload. 2. The right ventricle has moderately reduced systolic function. The cavity was mildly enlarged. There is no increase in right ventricular wall thickness. 3. Left atrial size was moderately dilated. 4. Right atrial size was moderately dilated. 5. Mild thickening of the mitral valve leaflet. Mitral valve regurgitation is moderate to severe by color flow Doppler. 6. The MV is moderately thickened. There is at least moderate to severe eccentric MR associated with pulmonary HTN and RV pressure/volume overlaod. RVSP 65-76mmHG. Would suggest TEE to further evaluate. 7. The aortic valve is tricuspid. Mild calcification of the aortic valve. 8. Pulmonic valve regurgitation is  moderate by color flow Doppler. 9. The aorta is normal in size and structure. 10. The inferior vena cava was dilated in size with >50% respiratory variability.   Assessment & Recommendations:  63 y.o. African American female  with WHO Grp I/III PAH NYHA class IV, COPD, former smoker, mild CAD, mod to severe MR, TR, admitted with worsening shortness of breath and overall functional status.  Acute on chronic RV failure, Grp I/III PH FC class IV: - Remains massively fluid overloaded. If she doe snot adequately diurese, she will need PICC line placed to monitor SVO2 and start dopamine. I am rather reluctant to do so, given her hyperkalemia, overnight NSVT, and risk of further tachyarrhythmias. It is vitally important to monitor her intake/output strictly.  - Hemodynamically stable. Lactic acid normal. She is not in shock. - Recommend reducing RV preload with diuresis. Agree with IV lasix 60 mg q6hr. Monitor I/O.  - If she has elevated lactic acid, inadequate urine output, or hemodynamic decompensation, she will need monitoring with RHC. With regards to vasopressor/inotrope therapy, I would avoid milrinone given her renal failure. If not hypotensive, recommend low dose dopamine will be a reasonable choice to improve RV contractility. If hypotensive, would consider norepinephrine. I do not think she will need mechanical support at this time.  -Since she has only taken 1 dose of Opsumit, okay to hold at this time, in the setting of severe leg edema, and to avoid acute hypotension.  - Recommend holding home meds including spironolactone, amlodipine, metoprolol, PO torsemide.  - Continue sildenafil 20 mg tid. - Check echocardiogram.  AKI/hyperkalemia: I suspect this is due to cardiorenal syndrome. Agree with K stabilizing management as per primary team. Lasix should also help.  Abdominal distension: Ascites from anasarca and fluid retention. She may benefit from paracentesis for symptomatic  relief.   Her functional capacity is limited. Her overall prognosis is poor. As we continue disease symptomatic management and modifying measures, it is reasonable to get palliative care involved. This does not mean that she will not be a candidate for [facility] therapy, but it will assist as in defining goals of care.   I have spoken to the patient regarding her overall condition and high risk for acute decompensation-including worsening renal dysfunction, cardiogenic shock. She understands the risks/benefits of all therapies discussed above.    I have discussed the recommendations with the primary team.  Nigel Mormon, M.D. 10/30/2019, 9:15 AM [facility] Pager: [phone] Office: [phone] If no answer: [phone]

## 2019-10-31 ENCOUNTER — Inpatient Hospital Stay (HOSPITAL_COMMUNITY): Payer: No Typology Code available for payment source

## 2019-10-31 ENCOUNTER — Inpatient Hospital Stay: Payer: Self-pay

## 2019-10-31 DIAGNOSIS — I2721 Secondary pulmonary arterial hypertension: Secondary | ICD-10-CM

## 2019-10-31 DIAGNOSIS — N179 Acute kidney failure, unspecified: Secondary | ICD-10-CM | POA: Diagnosis present

## 2019-10-31 DIAGNOSIS — Z515 Encounter for palliative care: Secondary | ICD-10-CM

## 2019-10-31 DIAGNOSIS — Z7189 Other specified counseling: Secondary | ICD-10-CM

## 2019-10-31 LAB — BASIC METABOLIC PANEL
Anion gap: 7 (ref 5–15)
Anion gap: 7 (ref 5–15)
BUN: 99 mg/dL — ABNORMAL HIGH (ref 8–23)
BUN: 100 mg/dL — ABNORMAL HIGH (ref 8–23)
CO2: 18 mmol/L — ABNORMAL LOW (ref 22–32)
CO2: 17 mmol/L — ABNORMAL LOW (ref 22–32)
Calcium: 8.5 mg/dL — ABNORMAL LOW (ref 8.9–10.3)
Calcium: 8.5 mg/dL — ABNORMAL LOW (ref 8.9–10.3)
Chloride: 113 mmol/L — ABNORMAL HIGH (ref 98–111)
Chloride: 113 mmol/L — ABNORMAL HIGH (ref 98–111)
Creatinine, Ser: 3.58 mg/dL — ABNORMAL HIGH (ref 0.44–1.00)
Creatinine, Ser: 3.32 mg/dL — ABNORMAL HIGH (ref 0.44–1.00)
GFR calc Af Amer: 15 mL/min — ABNORMAL LOW (ref >60)
GFR calc Af Amer: 16 mL/min — ABNORMAL LOW (ref >60)
GFR calc non Af Amer: 13 mL/min — ABNORMAL LOW (ref >60)
GFR calc non Af Amer: 14 mL/min — ABNORMAL LOW (ref >60)
Glucose, Bld: 98 mg/dL (ref 70–99)
Glucose, Bld: 97 mg/dL (ref 70–99)
Potassium: 5.6 mmol/L — ABNORMAL HIGH (ref 3.5–5.1)
Potassium: 5.3 mmol/L — ABNORMAL HIGH (ref 3.5–5.1)
Sodium: 138 mmol/L (ref 135–145)
Sodium: 137 mmol/L (ref 135–145)

## 2019-10-31 LAB — CBC
HCT: 27.7 % — ABNORMAL LOW (ref 36.0–46.0)
Hemoglobin: 9.2 g/dL — ABNORMAL LOW (ref 12.0–15.0)
MCH: 35.5 pg — ABNORMAL HIGH (ref 26.0–34.0)
MCHC: 33.2 g/dL (ref 30.0–36.0)
MCV: 106.9 fL — ABNORMAL HIGH (ref 80.0–100.0)
Platelets: 160 10*3/uL (ref 150–400)
RBC: 2.59 MIL/uL — ABNORMAL LOW (ref 3.87–5.11)
RDW: 16.8 % — ABNORMAL HIGH (ref 11.5–15.5)
WBC: 4.6 10*3/uL (ref 4.0–10.5)
nRBC: 0 % (ref 0.0–0.2)

## 2019-10-31 LAB — GLUCOSE, CAPILLARY
Glucose-Capillary: 140 mg/dL — ABNORMAL HIGH (ref 70–99)
Glucose-Capillary: 97 mg/dL (ref 70–99)

## 2019-10-31 LAB — VITAMIN B12: Vitamin B-12: 245 pg/mL (ref 180–914)

## 2019-10-31 MED ORDER — LIDOCAINE HCL (PF) 1 % IJ SOLN
INTRAMUSCULAR | Status: AC
  Filled 2019-10-31: qty 30

## 2019-10-31 MED ORDER — MILRINONE LACTATE IN DEXTROSE 20-5 MG/100ML-% IV SOLN
0.1250 ug/kg/min | INTRAVENOUS | Status: DC
  Administered 2019-10-31 – 2019-11-01 (×2): 0.125 ug/kg/min via INTRAVENOUS
  Filled 2019-10-31 (×2): qty 100

## 2019-10-31 MED ORDER — LIDOCAINE HCL URETHRAL/MUCOSAL 2 % EX GEL
1.0000 "application " | Freq: Once | CUTANEOUS | Status: DC
  Filled 2019-10-31: qty 20

## 2019-10-31 MED ORDER — CHLORHEXIDINE GLUCONATE CLOTH 2 % EX PADS
6.0000 | MEDICATED_PAD | Freq: Every day | CUTANEOUS | Status: DC
  Administered 2019-11-02 – 2019-11-13 (×11): 6 via TOPICAL

## 2019-10-31 MED ORDER — POLYETHYLENE GLYCOL 3350 17 G PO PACK: 17.0000 g | PACK | Freq: Every day | ORAL | Status: DC | PRN

## 2019-10-31 MED ORDER — FUROSEMIDE 10 MG/ML IJ SOLN
120.0000 mg | Freq: Three times a day (TID) | INTRAVENOUS | Status: DC
  Administered 2019-10-31 – 2019-11-02 (×5): 120 mg via INTRAVENOUS
  Filled 2019-10-31: qty 10
  Filled 2019-10-31: qty 12
  Filled 2019-10-31: qty 10
  Filled 2019-10-31 (×3): qty 12
  Filled 2019-10-31: qty 2

## 2019-10-31 MED ORDER — DOPAMINE-DEXTROSE 3.2-5 MG/ML-% IV SOLN: 5.0000 ug/kg/min | INTRAVENOUS | Status: DC

## 2019-10-31 NOTE — Consult Note (Addendum)
Renal Service Consult Note [facility]  Vanessa Williamson 10/31/2019 Vanessa Williamson Requesting Physician: Dr Heber Retreat  Reason for Consult: Renal failure HPI: The patient is a 63 y.o. year-old w/ hx of morbid obesity, COPD, pulm HTN, HTN and CKD 3 presented on 11/6 w/ worsening SOB and LE edema. In ED creat was up to 3, BUN 90's and marked ascites. Baseline creat was 1.8. Pt was admitted and started on IV lasix 60 qid. Only 100 cc UOP recorded yesterday. Milrinone started today.  Creat stable low 3's. Asked to see for renal failure.    Patient states she has had this problem one other time which was a Sept 2020 admit here for cor pulmonale related to COPD and severe primary pulm HTN.  R heart cath was down showing mod-severe pulm HTN, low PCWP and LVEDP.  She had COPD w/ hypoxemia w/o hypercapnea. Pt diuresed 5L, ARB was dc'd, isordil was dc'd. Seen by CHF team. Demadex 20 bid at dc.  Pt denies any nsaid use.   ROS  denies CP  no joint pain   no HA  no blurry vision  no rash  no diarrhea  no nausea/ vomiting  no dysuria  no difficulty voiding  no change in urine color    Past Medical History  Past Medical History:  Diagnosis Date  . Anemia   . Cancer ([place])    cervical  1970s   . Chronic kidney disease   . COPD (chronic obstructive pulmonary disease) ([place])   . GERD (gastroesophageal reflux disease)   . Hypertension    Past Surgical History  Past Surgical History:  Procedure Laterality Date  . BREAST BIOPSY Left    benign  . CERVIX LESION DESTRUCTION     frozen  . COLONOSCOPY  03/09/2008  . EYE SURGERY     laser  . FRACTURE SURGERY    . HUMERUS FRACTURE SURGERY     left  2005  . RIGHT/LEFT HEART CATH AND CORONARY ANGIOGRAPHY N/A 09/14/2019   Procedure: RIGHT/LEFT HEART CATH AND CORONARY ANGIOGRAPHY;  Surgeon: Adrian Prows, MD;  Location: [place] [facility];  Service: Cardiovascular;  Laterality: N/A;  . VENTRAL HERNIA REPAIR N/A 10/16/2017   Procedure:  [place];  Surgeon: Alphonsa Overall, MD;  Location: [place];  Service: General;  Laterality: N/A;   Family History  Family History  Problem Relation Age of Onset  . Hypertension Mother   . Heart disease Mother   . Hyperlipidemia Mother   . Heart disease Father   . Hypertension Father   . Heart disease Maternal Grandmother   . Heart disease Maternal Grandfather   . Cancer Sister   . Stroke Brother   . Hypertension Brother   . Colon cancer Neg Hx   . Colon polyps Neg Hx   . Esophageal cancer Neg Hx   . Stomach cancer Neg Hx   . Rectal cancer Neg Hx    Social History  reports that she quit smoking about 2 months ago. Her smoking use included cigarettes. She smoked 0.25 packs per day. She has never used smokeless tobacco. She reports current alcohol use. She reports that she does not use drugs. Allergies No Known Allergies Home medications Prior to Admission medications   Medication Sig Start Date End Date Taking? Authorizing Provider  acetaminophen (TYLENOL) 500 MG tablet Take 500-1,000 mg by mouth every 6 (six) hours as needed for mild pain or headache.   Yes [provider]  amLODipine (NORVASC) 5 MG tablet TAKE 1 TABLET BY MOUTH EVERY DAY Patient taking differently: Take 5 mg by mouth daily.  10/11/19  Yes Miquel Dunn, NP  aspirin EC 81 MG EC tablet Take 1 tablet (81 mg total) by mouth daily. 09/17/19  Yes Adrian Prows, MD  fluticasone (FLONASE) 50 MCG/ACT nasal spray Place 1-2 sprays into both nostrils daily as needed for allergies or rhinitis.   Yes [provider]  hydrALAZINE (APRESOLINE) 25 MG tablet Take 1 tablet (25 mg total) by mouth 3 (three) times daily. 09/16/19 12/15/19 Yes Adrian Prows, MD  loratadine (CLARITIN) 10 MG tablet Take 10 mg by mouth daily as needed for allergies or rhinitis.    Yes [provider]  macitentan (OPSUMIT) 10 MG tablet Take 1 tablet (10 mg total) by mouth daily. 10/04/19  Yes Miquel Dunn, NP  meloxicam (MOBIC) 7.5 MG tablet Take 7.5 mg by mouth daily as needed for pain.   Yes [provider]  metoprolol succinate (TOPROL-XL) 25 MG 24 hr tablet Take 1 tablet (25 mg total) by mouth daily. 10/28/19  Yes Miquel Dunn, NP  OXYGEN Inhale 3 L/min into the lungs continuous.   Yes [provider]  potassium chloride (KLOR-CON M10) 10 MEQ tablet Take 10 mEq by mouth 2 (two) times daily.   Yes [provider]  sildenafil (REVATIO) 20 MG tablet TAKE 1 TABLET BY MOUTH THREE TIMES A DAY Patient taking differently: Take 20 mg by mouth 3 (three) times daily.  10/11/19  Yes Miquel Dunn, NP  spironolactone (ALDACTONE) 50 MG tablet Take 1 tablet (50 mg total) by mouth daily. 10/28/19  Yes Miquel Dunn, NP  torsemide (DEMADEX) 20 MG tablet TAKE 1 TABLET (20 MG TOTAL) BY MOUTH 2 (TWO) TIMES DAILY WITH A MEAL. 10/11/19  Yes Miquel Dunn, NP  acetaminophen (TYLENOL) 325 MG tablet Take 2 tablets (650 mg total) by mouth every 4 (four) hours as needed for headache or mild pain. Patient not taking: Reported on 10/29/2019 09/16/19   Adrian Prows, MD  budesonide (PULMICORT) 0.25 MG/2ML nebulizer solution Take 2 mLs (0.25 mg total) by nebulization 2 (two) times daily. 09/16/19   Adrian Prows, MD   Liver Function Tests Recent Labs  Lab 10/30/19 1041  AST 14*  ALT 9  ALKPHOS 28*  BILITOT 0.7  PROT 6.2*  ALBUMIN 2.9*   No results for input(s): LIPASE, AMYLASE in the last 168 hours. CBC Recent Labs  Lab 10/29/19 1855 10/30/19 0518 10/31/19 0344  WBC 3.7* 4.3 4.6  HGB 10.1* 9.7* 9.2*  HCT 30.9* 29.6* 27.7*  MCV 103.7* 103.9* 106.9*  PLT 170 165 924   Basic Metabolic Panel Recent Labs  Lab 10/29/19 1755 10/29/19 1855 10/29/19 2215 10/30/19 0328 10/30/19 1041 10/30/19 1654 10/31/19 0344 10/31/19 1649  NA 138  --  139 140 138 139 138 137  K 5.9*  --  5.9* 5.9* 5.5* 5.8* 5.6* 5.3*  CL 113*  --  114* 116* 113* 112* 113*  113*  CO2 18*  --  18* 15* 17* 19* 18* 17*  GLUCOSE 103*  --  108* 88 103* 97 98 97  BUN 92*  --  96* 92* 95* 95* 99* 100*  CREATININE 2.93* 3.13* 3.08* 2.96* 3.24* 3.19* 3.58* 3.32*  CALCIUM 8.7*  --  8.6* 8.9 8.7* 8.9 8.5* 8.5*   Iron/TIBC/Ferritin/ %Sat    Component Value Date/Time   IRON 268 (H) 10/30/2019 2683  TIBC 224 (L) 10/30/2019 0328   FERRITIN 521 (H) 10/30/2019 0328   IRONPCTSAT 120 (H) 10/30/2019 0328    Vitals:   10/31/19 0940 10/31/19 0950 10/31/19 1000 10/31/19 1821  BP: 123/71 116/65 115/62 117/63  Pulse:    95  Resp:      Temp:      TempSrc:      SpO2:    99%  Weight:      Height:        Exam Gen ill appearing AAF, appears uncomfortable No rash, cyanosis or gangrene Sclera anicteric, throat clear  +JVD Chest clear bilat  RRR no MRG Abd soft massive ascites, nontender GU defer MS no joint effusions or deformity Ext diffuse 2-3+ bilat LE edema from ankles to hips Neuro mild gen'd weakness, nonfocal, Ox 3    Home meds:  - amlodipine 5 qd/ hydralazine 25 tid/ metoprolol xl 25 qd/ spironolactone 50 qd/ torsemide 20 bid/ Kdur  - sildenafil 20 tid  - budesonide nebs bid  - aspirin 81 qd  - home O2 3L   - prn's/ vitamins/ supplements    Date  Creat  eGFR  2013- 2014  0.88- 0.98  2015- 2019 0.88- 1.19 Jul 2019 1.1- 1.33 49- 62  Aug- Sept  1.18- 1.56 40- 56  Nov 6  3.12  Nov 7  3.24  Oct 31, 2019 3.58     CXR 11/6 > IMPRESSION: Underlying interstitial fibrosis with areas of atelectatic change. No frank edema or consolidation. Cardiomegaly, unchanged.  No adenopathy evident.    CT abd/ pelvis 11/6 > IMPRESSION:  Adrenals/Urinary Tract: Both adrenal glands appear normal. The kidneys and collecting system appear normal without evidence of urinary tract calculus or hydronephrosis. Bladder is unremarkable.   11/8 today > IMPRESSION: Successful ultrasound-guided paracentesis yielding 5 liters of peritoneal fluid   ECHO 11/7 > Left ventricular  ejection fraction, by visual estimation, is 55 to 60%. The left ventricle has normal function. There is mildly increased left ventricular hypertrophy.  2. Global right ventricle has low normal systolic function.The right ventricular size is moderately enlarged. Mildly increased right ventricular wall thickness.Marland KitchenMarland KitchenMarland KitchenHowever, enlarged right ventricle, and flattened interventricular septum and  bowing of interatrial septum to left suggest elevated right sided pressures    UA - not sent yet   Assessment/ Plan: 1. AKI - most likely cardiorenal from severe R heart failure as diagnosed on recent admit in September.  Massive ascites on exam today, 2days after 5 L paracentesis.  Recommend attempt at diuresis w/ high-dose diuretics. Get UA, renal US. Hopefully can improve volume status , however, kidneys either way remain very high risk for progressing to ESRD.  I don't think any type of dialysis would help this situation. With this severity of heart failure, conservative measures are warranted from a renal standpoint. Hopefully inotropes and diuretics can help in the short-term. Continue EOL discussions w/ patient that were started today by palliative care. Have d/w patient, she will think about all of this overnight. 2. R heart failure/ cor pulmonale  3. Severe COPD - on 3L [place] at home 4. DNR      Kelly Splinter  MD 10/31/2019, 7:03 PM

## 2019-10-31 NOTE — Procedures (Signed)
PROCEDURE SUMMARY:  Successful US guided paracentesis from right lateral abdomen.  Yielded 5 liters of clear yellow fluid.  No immediate complications.  Patient tolerated well.  EBL = trace   Anieya Helman S Layana Konkel PA-C 10/31/2019 11:23 AM

## 2019-10-31 NOTE — Plan of Care (Signed)
  Problem: Education: Goal: Knowledge of General Education information will improve Description: Including pain rating scale, medication(s)/side effects and non-pharmacologic comfort measures Outcome: Progressing   Problem: Health Behavior/Discharge Planning: Goal: Ability to manage health-related needs will improve Outcome: Progressing   Problem: Clinical Measurements: Goal: Ability to maintain clinical measurements within normal limits will improve Outcome: Progressing Goal: Will remain free from infection Outcome: Progressing Goal: Diagnostic test results will improve Outcome: Progressing   Problem: Activity: Goal: Risk for activity intolerance will decrease Outcome: Progressing   Problem: Nutrition: Goal: Adequate nutrition will be maintained Outcome: Progressing   Problem: Coping: Goal: Level of anxiety will decrease Outcome: Progressing   Problem: Pain Managment: Goal: General experience of comfort will improve Outcome: Progressing   Problem: Safety: Goal: Ability to remain free from injury will improve Outcome: Progressing   Problem: Skin Integrity: Goal: Risk for impaired skin integrity will decrease Outcome: Progressing   Elesa Hacker, RN

## 2019-10-31 NOTE — Progress Notes (Addendum)
Subjective: HD#2   Overnight: Bladder scan showed >1046mL of urine however this might have been an error as ascites might have interfered with volume status.  Nursing staff tried in and out cath as well as Foley several times without any improvement.  Today, Vanessa Williamson states she appears uncomfortable lying on her bed and asked me to assist her sitting up with her feet on the floor.  This provided her with some relief.  He acquired about her planned paracentesis and I gave her updates.  I also spoke with urology to assist with Foley catheter placement and she was examined at bedside with successful placement of Foley catheter with about 50 cc of urine output.  Objective:  Vital signs in last 24 hours: Vitals:   10/30/19 2225 10/31/19 0300 10/31/19 0411 10/31/19 0412  BP: 110/63  115/64 115/64  Pulse: 94  83 86  Resp: 18  20 (!) 21  Temp: 98.4 F (36.9 C)   97.9 F (36.6 C)  TempSrc: Oral   Oral  SpO2: 97%  91% 93%  Weight:  99.9 kg    Height:       Const: Grossly anasarcic HEENT: Nasal cannula in place Resp: Bibasilar crackles Abd: Very tight abdomen Ext: Lower extremity and abdominal wall edema still prevalent   Assessment/Plan:  Principal Problem:   Acute on chronic diastolic heart failure (HCC) Active Problems:   Anemia in chronic kidney disease (CKD)   Pulmonary arterial hypertension ([place])  [name] is a 63 year old F with significant PMH of HFpEF, WHO group 1 pulmonary pretension on OPSUMIT and Uptravi, morbid obesity, chronic kidney disease stage 3, COPD, hypertension, GERD, and iron deficiency anemia who presented with fluid overload and acute on chronic RV failure.     Acute on chronic diastolic heart failure with WHO group 1 PAH Cardiorenal syndrome Pt remains fluid overloaded on exam today with unchanged LE edema and abdominal distension. Most recent echo 06/2019 with LVEF 50-55%, impaired LV relaxation, RV with moderately reduced systolic function and  volume/pressure overload, right and left atrial dilation, and moderate to severe MR associated with pulmonary HTN and PV pressure/volume overload. RSVP 65-4mmHg. Repeat Echo from 11/7 with LVEF of 55-60%, global RV with low-normal systolic fuction  Cardiology consulted, appreciated their recommendations - diuresis with furosemide 60mg  IV q6h - if no adequately diurese, will need PICC line placed to monitor SVO2 and start dopamine - pt's overall prognosis is poor, consult to palliative care for defining goals of care - Strict Is/Os - Supplemental O2 prn - continue cardiac monitoring and pulse ox - continue sildenafil 20mg  TID - holding home spironolactone   Ascites 2/2 Decompensated HF Abdominal CT remarkable for large amount of abdominopelvic ascites and diffuse anasarca. - To IR for therapeutic paracentesis today   Acute on chronic CKD stage III-IV Concern for urinary retention  Cardiorenal syndrome sCr minimally this am 3.5 << 2.96, on admission 3.12 (baseline of 1.18).  Overnight, bladder scan had reviewed 1000 cc of urine however this was falsely elevated due to patient's anasarca.  Nursing attempt to place in and out cath as well as foley catheter unsiccessful.  Urology was consulted for assistance with Foley placement and was able to successfully place Foley catheter with only about 30 cc of urine output.  CT abdomen on admission did not reveal any evidence of urinary obstruction.  I believe the reason she is not having adequate urine output is inability to properly perfused her kidneys from ongoing  decompensated heart failure - will continue to monitor with follow-up BMPs   Hyperkalemia secondary to above- improving  K 5.6 < 5.9 (6.0 on admission).   She has still not had a bowel movement limiting the pharmacologic effect of Lokelma as well as lactulose.  This is in part due to her grossly large volume ascites.  We will try another dose of lactulose after her IR procedure.  If no  improvement, will give D50 and NovoLog 5 units - Lokelma 5g TID - f/u BMP    Hypertension - holding home amlodipine, hydralazine, and metoprolol   Iron deficiency anemia Macrocytic Anemia Likely due to medical renal disease. Iron studies obtained not reliable and she has already received IV feraheme. Hgb 9.2 << 9.7 today. B12 unremarkable.  - s/p  feraheme 510mg   - Pending Folic acid    Diet: low-Na diet Fluids: none DVT ppx: heparin 5,000 units subQ q8h CODE STATUS: DNR   Dispo: Anticipated discharge pending clinical course.   Vanessa Rosenthal, MD 10/31/2019, 6:27 AM Pager: [phone] Internal Medicine Teaching Service

## 2019-10-31 NOTE — Progress Notes (Signed)
[facility] NOTE   Pharmacy Consult for Milrinone Indication: acute inotropic support  No Known Allergies  Patient Measurements: Height: 5\' 4"  (162.6 cm) Weight: 220 lb 4.8 oz (99.9 kg) IBW/kg (Calculated) : 54.7  Vital Signs: BP: 115/62 (11/08 1000) Intake/Output from previous day: 11/07 0701 - 11/08 0700 In: 240 [P.O.:240] Out: 100 [Urine:100] Intake/Output from this shift: No intake/output data recorded.  Labs: Recent Labs    10/29/19 1855  10/30/19 0328 10/30/19 0518 10/30/19 1041 10/30/19 1654 10/31/19 0344  WBC 3.7*  --   --  4.3  --   --  4.6  HGB 10.1*  --   --  9.7*  --   --  9.2*  HCT 30.9*  --   --  29.6*  --   --  27.7*  PLT 170  --   --  165  --   --  160  CREATININE 3.13*   < > 2.96*  --  3.24* 3.19* 3.58*  MG  --   --  2.1  --   --   --   --   ALBUMIN  --   --   --   --  2.9*  --   --   PROT  --   --   --   --  6.2*  --   --   AST  --   --   --   --  14*  --   --   ALT  --   --   --   --  9  --   --   ALKPHOS  --   --   --   --  28*  --   --   BILITOT  --   --   --   --  0.7  --   --    < > = values in this interval not displayed.   Estimated Creatinine Clearance: 18.2 mL/min (A) (by C-G formula based on SCr of 3.58 mg/dL (H)).   Microbiology: Recent Results (from the past 720 hour(s))  SARS CORONAVIRUS 2 (TAT 6-24 HRS) Nasopharyngeal Nasopharyngeal Swab     Status: None   Collection Time: 10/29/19  2:39 PM   Specimen: Nasopharyngeal Swab  Result Value Ref Range Status   SARS Coronavirus 2 NEGATIVE NEGATIVE Final    Comment: (NOTE) SARS-CoV-2 target nucleic acids are NOT DETECTED. The SARS-CoV-2 RNA is generally detectable in upper and lower respiratory specimens during the acute phase of infection. Negative results do not preclude SARS-CoV-2 infection, do not rule out co-infections with other pathogens, and should not be used as the sole basis for treatment or other patient management decisions. Negative results must be  combined with clinical observations, patient history, and epidemiological information. The expected result is Negative. Fact Sheet for Patients: SugarRoll.be Fact Sheet for Healthcare Providers: https://www.woods-mathews.com/ This test is not yet approved or cleared by the [place] FDA and  has been authorized for detection and/or diagnosis of SARS-CoV-2 by FDA under an Emergency Use Authorization (EUA). This EUA will remain  in effect (meaning this test can be used) for the duration of the COVID-19 declaration under Section 56 4(b)(1) of the Act, 21 U.S.C. section 360bbb-3(b)(1), unless the authorization is terminated or revoked sooner. Performed at [facility], [place] [address] [place], [place] [address]     Medications:  Scheduled:  . budesonide  0.25 mg Nebulization BID  . heparin  5,000 Units Subcutaneous Q8H  . lidocaine (PF)      .  lidocaine  1 application Urethral Once  . sildenafil  20 mg Oral TID  . sodium zirconium cyclosilicate  5 g Oral TID   Infusions:  . ferumoxytol 510 mg (10/29/19 2308)  . milrinone      Assessment: 63 yo F admitted 11/6 with complaints of back and sacral pain.  Pt now being managed for HF exacerbation, PAH, and AKI.  Pharmacy asked to renal dose Milrinone for inotropic support.  Noted SCr up to 3.5 (baseline 1.18).  Goal of Therapy:  Inotropic support, effective diuresis  Plan:  Initiate Milrinone at 0.125 mcg/kg/min. Lower dose started based on BP (110s/60s) and AKI Further dose titration per MD.  Can increase dose to 0.25 mcg/kg/min based on clinical response.  Max recommended dose is 0.375 mcg/kg/min.   [employer], Pharm.D., BCPS Clinical Pharmacist  **Pharmacist phone directory can now be found on [email or web address] ([email or web address]).  Listed under [place].  10/31/2019 5:24 PM

## 2019-10-31 NOTE — Consult Note (Signed)
Urology Consult Note   Requesting Attending Physician:  Lucious Groves, DO Service Providing Consult: Urology   Reason for Consult:  Difficult Female Urethral Catheterization  HPI: Vanessa Williamson is seen in consultation for reasons noted above at the request of Lucious Groves, DO for evaluation of difficult female urethral catheterization.  This is a 63 y.o. female with CHF, morbid obesity, CKD, COPD, HTN, GERD, anemia who presented in fluid overload. Urology was consulted for Foley catheter placement.   Creatinine had been uptrending; she was receiving IV lasix with minimal UOP. Bladder scanned for ~1086ml. RN attempted I/O catheterization however only minimal return of fluid. They reported a "few drops of urine" and then reported resistance with Foley placement. Of note, planning for paracentesis.   Patient reports no prior urologic history. Denies prior pelvic surgery, vaginal deliveries/C-section. Denies nephrolithiasis, recurrent UTIs. Does endorse occasional urinary urgency. She voids 'normally' at home, without dysuria. Never seen gross hematuria   Past Medical History: Past Medical History:  Diagnosis Date   Anemia    Cancer ([place])    cervical  1970s    Chronic kidney disease    COPD (chronic obstructive pulmonary disease) ([facility])    GERD (gastroesophageal reflux disease)    Hypertension     Past Surgical History:  Past Surgical History:  Procedure Laterality Date   BREAST BIOPSY Left    benign   CERVIX LESION DESTRUCTION     frozen   COLONOSCOPY  03/09/2008   EYE SURGERY     laser   FRACTURE SURGERY     HUMERUS FRACTURE SURGERY     left  2005   RIGHT/LEFT HEART CATH AND CORONARY ANGIOGRAPHY N/A 09/14/2019   Procedure: RIGHT/LEFT HEART CATH AND CORONARY ANGIOGRAPHY;  Surgeon: Adrian Prows, MD;  Location: [facility];  Service: Cardiovascular;  Laterality: N/A;   VENTRAL HERNIA REPAIR N/A 10/16/2017   Procedure: OPEN VENTRAL HERNIA  REPAIR ERAS  PATHWAY;  Surgeon: Alphonsa Overall, MD;  Location: [facility];  Service: General;  Laterality: N/A;    Medication: Current Facility-Administered Medications  Medication Dose Route Frequency Provider Last Rate Last Dose   acetaminophen (TYLENOL) tablet 650 mg  650 mg Oral Q6H PRN Katherine Roan, MD   650 mg at 10/30/19 2007   budesonide (PULMICORT) nebulizer solution 0.25 mg  0.25 mg Nebulization BID Jean Rosenthal, MD   0.25 mg at 10/30/19 2039   diclofenac sodium (VOLTAREN) 1 % transdermal gel 2 g  2 g Topical TID PRN Earlene Plater, MD       ferumoxytol [facility]) 510 mg in sodium chloride 0.9 % 100 mL IVPB  510 mg Intravenous Weekly Lucious Groves, DO 468 mL/hr at 10/29/19 2308 510 mg at 10/29/19 2308   furosemide (LASIX) injection 60 mg  60 mg Intravenous Q6H Agyei, Obed K, MD   60 mg at 10/31/19 0255   heparin injection 5,000 Units  5,000 Units Subcutaneous Q8H Agyei, Obed K, MD   5,000 Units at 10/31/19 8502   lidocaine (LIDODERM) 5 % 1 patch  1 patch Transdermal Daily PRN Earlene Plater, MD       lidocaine (XYLOCAINE) 2 % jelly 1 application  1 application Urethral Once Lucious Groves, DO       sildenafil (REVATIO) tablet 20 mg  20 mg Oral TID Jean Rosenthal, MD   20 mg at 10/30/19 2210   sodium zirconium cyclosilicate (LOKELMA) packet 5 g  5 g Oral TID Agyei, Obed K,  MD   5 g at 10/30/19 2210    Allergies: No Known Allergies  Social History: Social History   Tobacco Use   Smoking status: Former Smoker    Packs/day: 0.25    Types: Cigarettes    Quit date: 08/2019    Years since quitting: 0.1   Smokeless tobacco: Never Used   Tobacco comment: been smoking since age 33  Substance Use Topics   Alcohol use: Yes    Comment: rarely   Drug use: No    Comment: 2005    Family History Family History  Problem Relation Age of Onset   Hypertension Mother    Heart disease Mother    Hyperlipidemia Mother    Heart disease Father    Hypertension Father     Heart disease Maternal Grandmother    Heart disease Maternal Grandfather    Cancer Sister    Stroke Brother    Hypertension Brother    Colon cancer Neg Hx    Colon polyps Neg Hx    Esophageal cancer Neg Hx    Stomach cancer Neg Hx    Rectal cancer Neg Hx     Review of Systems 10 systems were reviewed and are negative except as noted specifically in the HPI.  Objective   Vital signs in last 24 hours: BP 115/64    Pulse 86    Temp 97.9 F (36.6 C) (Oral)    Resp (!) 21    Ht 5\' 4"  (1.626 m)    Wt 99.9 kg    SpO2 93%    BMI 37.81 kg/m   Physical Exam General: NAD, A&O, resting, appropriate HEENT: /AT, EOMI, MMM Pulmonary: Normal work of breathing Cardiovascular: HDS, adequate peripheral perfusion Abdomen: Soft, morbidly obese. GU: Normal external labia, urethral meatus easily identified, 16Fr Foley catheter placed without resistance with return of clear yellow urine (Only ~35cc), no CVA tenderness Extremities: warm and well perfused Neuro: Appropriate, no focal neurological deficits  Most Recent Labs: Lab Results  Component Value Date   WBC 4.6 10/31/2019   HGB 9.2 (L) 10/31/2019   HCT 27.7 (L) 10/31/2019   PLT 160 10/31/2019    Lab Results  Component Value Date   NA 138 10/31/2019   K 5.6 (H) 10/31/2019   CL 113 (H) 10/31/2019   CO2 18 (L) 10/31/2019   BUN 99 (H) 10/31/2019   CREATININE 3.58 (H) 10/31/2019   CALCIUM 8.5 (L) 10/31/2019   MG 2.1 10/30/2019   PHOS 4.6 08/17/2019   IMAGING: Ct Abdomen Pelvis Wo Contrast  Result Date: 10/29/2019 CLINICAL DATA:  Abdominal distension, constipation EXAM: CT ABDOMEN AND PELVIS WITHOUT CONTRAST TECHNIQUE: Multidetector CT imaging of the abdomen and pelvis was performed following the standard protocol without IV contrast. COMPARISON:  CT abdomen pelvis September 20 06/11/2017, CT chest September 13, 2019 FINDINGS: Lower chest: There is mild cardiomegaly. Streaky opacities, likely atelectasis or scarring seen at  both lung bases. No hiatal hernia. Hepatobiliary: Although limited due to the lack of intravenous contrast, normal in appearance without gross focal abnormality. No evidence of calcified gallstones or biliary ductal dilatation. Pancreas:  Limited visualization due to massive ascites. Spleen: Normal in size. Although limited due to the lack of intravenous contrast, normal in appearance. Adrenals/Urinary Tract: Both adrenal glands appear normal. The kidneys and collecting system appear normal without evidence of urinary tract calculus or hydronephrosis. Bladder is unremarkable. Stomach/Bowel: The stomach, small bowel, and colon are grossly unremarkable, however limited due to ascites. There is a  moderate amount colonic stool. Vascular/Lymphatic: There is a large amount of abdominopelvic ascites which limits evaluation of the solid organs. Scattered aortic atherosclerotic calcifications are seen without aneurysmal dilatation. Reproductive: Limited visualization. The uterus appears grossly unremarkable. Other: Diffuse anasarca seen throughout. Musculoskeletal: No acute or significant osseous findings. Grade 1 anterolisthesis of L4 on L5 is seen. IMPRESSION: Large amount of abdominopelvic ascites and extensive diffuse anasarca. due to the ascites there is limited visualization of abdominopelvic or in, however no definite gross abnormalities. Aortic Atherosclerosis (ICD10-I70.0). Streaky atelectasis seen at both lung bases. Electronically Signed   By: Prudencio Pair M.D.   On: 10/29/2019 21:54   Dg Chest 2 View  Result Date: 10/29/2019 CLINICAL DATA:  Shortness of breath. History of congestive heart failure. EXAM: CHEST - 2 VIEW COMPARISON:  September 15, 2019 chest radiograph; chest CT September 13, 2019 FINDINGS: There is underlying fibrotic type change. There is atelectatic change in portions of each upper lobe and left base. There is no frank edema or consolidation. There is cardiomegaly with pulmonary vascularity  within normal limits. No adenopathy. There is a total shoulder replacement on the left. IMPRESSION: Underlying interstitial fibrosis with areas of atelectatic change. No frank edema or consolidation. Cardiomegaly, unchanged.  No adenopathy evident. Electronically Signed   By: Lowella Grip III M.D.   On: 10/29/2019 15:21   Dg Ankle Complete Left  Result Date: 10/29/2019 CLINICAL DATA:  Pain following fall EXAM: LEFT ANKLE COMPLETE - 3+ VIEW COMPARISON:  None. FINDINGS: Frontal, oblique, and lateral views were obtained. There is generalized soft tissue swelling. There is no demonstrable fracture or joint effusion. There is no joint space narrowing or erosion. The ankle mortise appears intact. There is a small inferior calcaneal spur. IMPRESSION: Soft tissue swelling. No appreciable fracture or joint space narrowing. Ankle mortise appears intact. There is a small inferior calcaneal spur. Electronically Signed   By: Lowella Grip III M.D.   On: 10/29/2019 15:22    ------  Assessment:  63 y.o. female with CHF, morbid obesity, CKD, COPD, HTN, GERD, anemia who presented in fluid overload. Urology was consulted for Foley catheter placement.   Urethral catheters were likely placed correctly initially. No contraindication to RN catheterizations in the future. Normal external female GU anatomy.   Catheter placed with only 35cc of urine returned. Bladder scan likely inaccurate in the setting of anasarca. CT from 11/6 with no hydronephrosis, normal appearing bladder, and large volume of ascites. There is discussion of paracentesis. Creatinine has been rising; lasix does not appear to be having desired effect.    Recommendations: - Foley catheter per primary team. - No contraindication to RN urethral catheterizations in the future. - No urology follow up required.   Thank you for this consult.

## 2019-10-31 NOTE — Progress Notes (Addendum)
Subjective:  Feels much better this morning, after 5 L paracentesis. Minimal urine output through Foley cathter.   Objective:  Vital Signs in the last 24 hours: Temp:  [97.9 F (36.6 C)-98.4 F (36.9 C)] 97.9 F (36.6 C) (11/08 0412) Pulse Rate:  [79-94] 86 (11/08 0412) Resp:  [18-21] 21 (11/08 0412) BP: (110-123)/(62-73) 115/62 (11/08 1000) SpO2:  [91 %-98 %] 93 % (11/08 0412) Weight:  [99.9 kg] 99.9 kg (11/08 0300)  Intake/Output from previous day: 11/07 0701 - 11/08 0700 In: 240 [P.O.:240] Out: 100 [Urine:100]  Physical Exam Constitutional: She is oriented to person, place, and time. She appears well-developed. She appears distressed (Mild respiratory distress).  Chronically ill appearing HENT:  Head: Normocephalic and atraumatic.  Eyes: Pupils are equal, round, and reactive to light. Conjunctivae are normal.  Neck: JVD present.  Cardiovascular: Normal rate, regular rhythm and intact distal pulses.  Murmur heard.  Systolic murmur is present with a grade of 2/6 at the upper left sternal border. Loud P2  Pulmonary/Chest: Effort normal and breath sounds normal. She has no wheezes. She has no rales.  Abdominal: Soft. Bowel sounds are normal.Large volume ascites. Musculoskeletal:        General: Edema (3+) present.  Lymphadenopathy:    She has no cervical adenopathy.  Neurological: She is alert and oriented to person, place, and time. No cranial nerve deficit.  Skin: Skin is warm and dry.  Psychiatric: She has a normal mood and affect.  Nursing note and vitals reviewed.  Lab Results: BMP Recent Labs    10/30/19 1041 10/30/19 1654 10/31/19 0344  NA 138 139 138  K 5.5* 5.8* 5.6*  CL 113* 112* 113*  CO2 17* 19* 18*  GLUCOSE 103* 97 98  BUN 95* 95* 99*  CREATININE 3.24* 3.19* 3.58*  CALCIUM 8.7* 8.9 8.5*  GFRNONAA 14* 15* 13*  GFRAA 17* 17* 15*    CBC Recent Labs  Lab 10/31/19 0344  WBC 4.6  RBC 2.59*  HGB 9.2*  HCT 27.7*  PLT 160  MCV 106.9*  MCH  35.5*  MCHC 33.2  RDW 16.8*    BNP (last 3 results) Recent Labs    09/12/19 0623 09/13/19 0629 10/29/19 1358  BNP 715.6* 636.0* 125.0*    Lipid Panel     Component Value Date/Time   CHOL 157 07/20/2018 1452   TRIG 98 07/20/2018 1452   HDL 42 07/20/2018 1452   CHOLHDL 3.7 07/20/2018 1452   CHOLHDL 4.3 12/10/2013 0856   VLDL 18 12/10/2013 0856   LDLCALC 95 07/20/2018 1452     Hepatic Function Panel Recent Labs    08/17/19 1318 09/10/19 1706 10/30/19 1041  PROT  --  6.7 6.2*  ALBUMIN 3.2* 3.1* 2.9*  AST  --  12* 14*  ALT  --  9 9  ALKPHOS  --  49 28*  BILITOT  --  1.1 0.7   CARDIAC STUDIES:  Telemetry 10/30/2019: 5 beat NSVT  EKG 10/29/2019: Sinus rhythm. Nonspecific ST-T changes.  Echocardiogram 07/20/2019:  1. The left ventricle has low normal systolic function, with an ejection fraction of 50-55%. The cavity size was mildly dilated. Left ventricular diastolic Doppler parameters are consistent with impaired relaxation. There is right ventricular volume and pressure overload. 2. The right ventricle has moderately reduced systolic function. The cavity was mildly enlarged. There is no increase in right ventricular wall thickness. 3. Left atrial size was moderately dilated. 4. Right atrial size was moderately dilated. 5. Mild thickening of the mitral  valve leaflet. Mitral valve regurgitation is moderate to severe by color flow Doppler. 6. The MV is moderately thickened. There is at least moderate to severe eccentric MR associated with pulmonary HTN and RV pressure/volume overlaod. RVSP 65-42mmHG. Would suggest TEE to further evaluate. 7. The aortic valve is tricuspid. Mild calcification of the aortic valve. 8. Pulmonic valve regurgitation is moderate by color flow Doppler. 9. The aorta is normal in size and structure. 10. The inferior vena cava was dilated in size with >50% respiratory variability.   Assessment & Recommendations:  63 y.o. African  American female  with WHO Grp I/III PAH NYHA class IV, COPD, former smoker, mild CAD, mod to severe MR, TR, admitted with worsening shortness of breath and overall functional status.  Acute on chronic RV failure, Grp I/III PH FC class IV: Remains massively fluid overloaded, now oliguric.  I am hopeful that large-volume paracentesis will help decompression.  Continue to monitor intake and output closely.  Recommend holding Lasix today.  Recommend placing PICC line and starting on low-dose dopamine.  If PICC line not possible due to access issues, could start on renally doses milrinone through large bore IV. Eventually, if she remains anuric/oliguric, she may need renal replacement therapy. - Hemodynamically stable. Lactic acid normal. She is not in shock. -Since she has only taken 1 dose of Opsumit, okay to hold at this time, in the setting of severe leg edema, and to avoid acute hypotension.  - Recommend holding home meds including spironolactone, amlodipine, metoprolol, PO torsemide.  - Continue sildenafil 20 mg tid.  AKI/hyperkalemia: I suspect this is due to cardiorenal syndrome. Agree with K stabilizing management as per primary team. Consider getting nephrology on board. I am not sure she will be a long term dialysis candidate with her comorbidities, but would appreciate their input regarding acute management.  Ascites: S/p large-volume paracentesis 5 L 10/31/2019.  Her functional capacity is limited. Her overall prognosis is poor. As we continue symptomatic management and disease modifying measures, it is reasonable to get palliative care involved. This does not mean that she will not be a candidate for [facility] therapy, but it will assist as in defining goals of care.   I have spoken to the patient regarding her overall condition and high risk for acute decompensation-including worsening renal dysfunction, cardiogenic shock. She understands the risks/benefits of all therapies discussed above.     I have discussed the recommendations with the primary team.  Nigel Mormon, M.D. 10/31/2019, 11:58 AM [facility] Pager: [phone] Office: [phone] If no answer: [phone]

## 2019-10-31 NOTE — Progress Notes (Signed)
Spoke with Yoko, RN - states pt says has not had sx on lymph nodes.  Pt also to be on Milrinone gtt.

## 2019-10-31 NOTE — Progress Notes (Signed)
Spoke with Yoko RN re  PICC order.  Notified that needs nephrology approval due to cr clearance of 18 and history  Per chart hx of CKD III, and mammography indicates lymphadenopathy bil.  Need renal clearance and if lymph nodes have been removed, need clearance re if either arm is restricted.

## 2019-10-31 NOTE — Progress Notes (Signed)
Patient has had minimal urine output since receiving IV lasix.  States she normally voids more at home.  Bladder scan shows >1000 mL.  Dr. Charleen Kirks notified.  Order received for I/O cath.  I/O cath attempted x 2 by myself and Gerarda Fraction, RN with no success.  Urine noted in catheter tube, but unable to drain more than a few drops of urine.  Dr. Charleen Kirks again notified, and order received for foley catheter.  Foley insertion attempted x 2 by Yetta Glassman, RN and Marcelyn Ditty, RN.  Foley insertion unsuccessful d/t inability to pass catheter into bladder secondary to resistance with insertion.  Dr. Charleen Kirks notified of inability to place foley.  Jodell Cipro
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
## 2019-10-31 NOTE — Procedures (Addendum)
Foley Catheter Placement Note  Indications: 63 y.o. female with rising creatinine, fluid overload, on lasix, severe ascites, with prior inability to perform urethral catheterization  Pre-operative Diagnosis: Oliguria, difficult urethral catheterization  Post-operative Diagnosis: Same  Physician: Nancee Liter, MD  Assistants: None  Procedure Details  Patient was placed in the supine position, prepped with Betadine and draped in the usual sterile fashion.  We injected lidocaine jelly per urethra prior to the procedure.  We then inserted a 16 French straight catheter per urethra which easily passed into the bladder.  We achieved return of clear yellow urine and then proceeded to insert 10 mL of sterile water into the Foley balloon.  The catheter was attached to a drainage bag and secured with a StatLock.  Placement of the catheter had return of only 35 mL of clear yellow urine.               Complications: None; patient tolerated the procedure well.  Plan:   1.  Foley catheter per primary team 2.  See consult note.       Attending Attestation: Dr. Gloriann Loan was available.

## 2019-11-01 ENCOUNTER — Inpatient Hospital Stay: Payer: Self-pay

## 2019-11-01 DIAGNOSIS — D539 Nutritional anemia, unspecified: Secondary | ICD-10-CM

## 2019-11-01 LAB — FOLATE RBC
Folate, Hemolysate: 208 ng/mL
Folate, RBC: 791 ng/mL (ref >498)
Hematocrit: 26.3 % — ABNORMAL LOW (ref 34.0–46.6)

## 2019-11-01 LAB — CBC
HCT: 25.7 % — ABNORMAL LOW (ref 36.0–46.0)
Hemoglobin: 8.5 g/dL — ABNORMAL LOW (ref 12.0–15.0)
MCH: 35.1 pg — ABNORMAL HIGH (ref 26.0–34.0)
MCHC: 33.1 g/dL (ref 30.0–36.0)
MCV: 106.2 fL — ABNORMAL HIGH (ref 80.0–100.0)
Platelets: 144 10*3/uL — ABNORMAL LOW (ref 150–400)
RBC: 2.42 MIL/uL — ABNORMAL LOW (ref 3.87–5.11)
RDW: 17.5 % — ABNORMAL HIGH (ref 11.5–15.5)
WBC: 4.6 10*3/uL (ref 4.0–10.5)
nRBC: 0 % (ref 0.0–0.2)

## 2019-11-01 LAB — BASIC METABOLIC PANEL
Anion gap: 8 (ref 5–15)
BUN: 100 mg/dL — ABNORMAL HIGH (ref 8–23)
CO2: 18 mmol/L — ABNORMAL LOW (ref 22–32)
Calcium: 8.5 mg/dL — ABNORMAL LOW (ref 8.9–10.3)
Chloride: 114 mmol/L — ABNORMAL HIGH (ref 98–111)
Creatinine, Ser: 3.29 mg/dL — ABNORMAL HIGH (ref 0.44–1.00)
GFR calc Af Amer: 16 mL/min — ABNORMAL LOW (ref >60)
GFR calc non Af Amer: 14 mL/min — ABNORMAL LOW (ref >60)
Glucose, Bld: 103 mg/dL — ABNORMAL HIGH (ref 70–99)
Potassium: 5.2 mmol/L — ABNORMAL HIGH (ref 3.5–5.1)
Sodium: 140 mmol/L (ref 135–145)

## 2019-11-01 LAB — GLUCOSE, CAPILLARY
Glucose-Capillary: 102 mg/dL — ABNORMAL HIGH (ref 70–99)
Glucose-Capillary: 111 mg/dL — ABNORMAL HIGH (ref 70–99)
Glucose-Capillary: 112 mg/dL — ABNORMAL HIGH (ref 70–99)

## 2019-11-01 MED ORDER — SODIUM CHLORIDE 0.9% FLUSH
3.0000 mL | Freq: Two times a day (BID) | INTRAVENOUS | Status: DC
  Administered 2019-11-01 – 2019-11-13 (×16): 3 mL via INTRAVENOUS

## 2019-11-01 MED ORDER — SODIUM CHLORIDE 0.9% FLUSH
10.0000 mL | INTRAVENOUS | Status: DC | PRN
  Administered 2019-11-11: 10 mL
  Filled 2019-11-01: qty 40

## 2019-11-01 MED ORDER — SODIUM CHLORIDE 0.9 % IV SOLN: INTRAVENOUS | Status: DC | PRN

## 2019-11-01 MED ORDER — DOBUTAMINE IN D5W 4-5 MG/ML-% IV SOLN
2.5000 ug/kg/min | INTRAVENOUS | Status: DC
  Administered 2019-11-01: 2.5 ug/kg/min via INTRAVENOUS
  Filled 2019-11-01: qty 250

## 2019-11-01 MED ORDER — PATIROMER SORBITEX CALCIUM 8.4 G PO PACK
8.4000 g | PACK | Freq: Every day | ORAL | Status: DC
  Administered 2019-11-02: 8.4 g via ORAL
  Filled 2019-11-01 (×5): qty 1

## 2019-11-01 MED ORDER — SODIUM CHLORIDE 0.9% FLUSH
10.0000 mL | Freq: Two times a day (BID) | INTRAVENOUS | Status: DC
  Administered 2019-11-01 – 2019-11-12 (×16): 10 mL

## 2019-11-01 NOTE — Progress Notes (Signed)
   Subjective:   Overnight: She was started on a milrinone infusion.   Today, Vanessa Williamson reported that she was uncomfortable lying in 1 position on the bed and has been rolling back and forth to decrease her level of discomfort next.  In regards to symptoms, there have been essentially no change.  We are working closely with the cardiology and nephrology team to give her the best care.  She is well aware of our efforts.  Objective:  Vital signs in last 24 hours: Vitals:   10/31/19 2154 10/31/19 2335 11/01/19 0553 11/01/19 0600  BP: 120/68 (!) 102/54 100/62   Pulse: 93 97 93   Resp:      Temp: 98 F (36.7 C) 97.6 F (36.4 C) 98.4 F (36.9 C)   TempSrc: Oral Oral Oral   SpO2: 93% 91% 91%   Weight:    96 kg  Height:       Physical Exam  Constitutional:  In mild distress secondary to tailbone pain from lying in 1 position.  Cardiovascular: Normal rate, regular rhythm and normal heart sounds.  Abdominal: She exhibits distension (Due to large volume ascites).  Musculoskeletal:        General: Edema present.    Assessment/Plan:  Principal Problem:   Acute on chronic diastolic heart failure (HCC) Active Problems:   Anemia in chronic kidney disease (CKD)   Pulmonary arterial hypertension ([place])   AKI (acute kidney injury) ([place])   Palliative care encounter   Ms. Staurtis a57 year oldFwith significantPMH of HFpEF,WHO group 1pulmonary pretension on OPSUMIT and Uptravi, morbid obesity, chronic kidney disease stage 3, COPD, hypertension, GERD,andiron deficiency anemia who presented withfluid overload and acute on chronic RV failure.   Acute on chronic diastolic heart failure with WHO group 1 PAH Cardiorenal syndrome Pt remains fluid overloaded on exam today with unchanged LE edema and abdominal distension.Most recent echo 06/2019 with LVEF 50-55%, impaired LV relaxation, RV with moderately reduced systolic function and volume/pressure overload, right and left atrial  dilation, and moderate to severe MR associated with pulmonary HTN and PV pressure/volume overload. RSVP 65-28mmHg.Repeat Echo from 11/7 with LVEF of 55-60%, global RV with low-normal systolic fuction  Cardiology consulted, appreciated their recommendations - On IV Lasix 120mg  q8h, dobutamine infusion, milrinone infusion  - strict I/Os - supplemental O2 as needed - continue cardiac monitoring and pulse ox - continue sildenafil 20mg  TID - holding home spironolactone   Ascites 2/2 Decompensated HF s/p therapeutic paracentesis Abdominal CT remarkable for large amount of abdominopelvic ascites and diffuse anasarca.  She underwent therapeutic paracentesis on 10/31/2019 with 5 L output.  Acute on chronic CKD stage III-IV Cardiorenal syndrome sCr  3.2<<3.3 on admission 3.12 (baseline of 1.18). - Appreciate nephrology recommendation - will continue to monitor withfollow-upBMPs  Hyperkalemia secondary to above- improving  - Lokelma5g TID - f/u BMP   Hypertension - holding home amlodipine, hydralazine, and metoprolol  Iron deficiency anemia Macrocytic Anemia Her B12 was unremarkable at 245.  She is status post IV Feraheme infusion - Pending Folic acid  - Pending methylmalonic acid level  Diet: low-Na diet Fluids: none DVTppx:heparin 5,000 units subQ q8h CODE STATUS:DNR  Dispo: Anticipated discharge pending improvement in clinical status  [name], MD 11/01/2019, 6:22 AM Pager: [phone]

## 2019-11-01 NOTE — Progress Notes (Signed)
Subjective:  Feels improved with regards to dyspnea, abdominal distention persists.   Intake/Output from previous day:  I/O last 3 completed shifts: In: 934.4 [P.O.:840; I.V.:32.4; IV Piggyback:62] Out: 576 [Urine:576] No intake/output data recorded.  Blood pressure 116/64, pulse 93, temperature 98.4 F (36.9 C), temperature source Oral, resp. rate 18, height _0  (1.626 m), weight 96 kg, SpO2 93 %. Physical Exam  Constitutional: She is oriented to person, place, and time. Vital signs are normal.  She is moderately built and well-nourished, appears chronically ill-looking  HENT:  Head: Normocephalic and atraumatic.  Neck: Normal range of motion. JVD present.  Cardiovascular: Normal rate, regular rhythm, normal heart sounds and intact distal pulses.  Pulses:      Femoral pulses are 0 on the right side and 0 on the left side.      Popliteal pulses are 0 on the right side and 0 on the left side.       Dorsalis pedis pulses are 0 on the right side and 0 on the left side.       Posterior tibial pulses are 0 on the right side and 0 on the left side.  2-3 + bilateral pitting edema. No tenderness  Pulmonary/Chest: Effort normal and breath sounds normal. No accessory muscle usage. No respiratory distress.  Abdominal: Soft. Bowel sounds are normal. She exhibits distension. There is no hepatosplenomegaly.  Umbilical hernia repair. Ascites present  Musculoskeletal: Normal range of motion.  Neurological: She is alert and oriented to person, place, and time.  Skin: Skin is warm and dry.    Lab Results: BMP BNP (last 3 results) Recent Labs    09/12/19 0623 09/13/19 0629 10/29/19 1358  BNP 715.6* 636.0* 125.0*    ProBNP (last 3 results) No results for input(s): PROBNP in the last 8760 hours. BMP Latest Ref Rng & Units 11/01/2019 10/31/2019 10/31/2019  Glucose 70 - 99 mg/dL 103(H) 97 98  BUN 8 - 23 mg/dL 100(H) 100(H) 99(H)  Creatinine 0.44 - 1.00 mg/dL 3.29(H) 3.32(H) 3.58(H)   BUN/Creat Ratio 12 - 28 - - -  Sodium 135 - 145 mmol/L 140 137 138  Potassium 3.5 - 5.1 mmol/L 5.2(H) 5.3(H) 5.6(H)  Chloride 98 - 111 mmol/L 114(H) 113(H) 113(H)  CO2 22 - 32 mmol/L 18(L) 17(L) 18(L)  Calcium 8.9 - 10.3 mg/dL 8.5(L) 8.5(L) 8.5(L)   Hepatic Function Latest Ref Rng & Units 10/30/2019 09/10/2019 08/17/2019  Total Protein 6.5 - 8.1 g/dL 6.2(L) 6.7 -  Albumin 3.5 - 5.0 g/dL 2.9(L) 3.1(L) 3.2(L)  AST 15 - 41 U/L 14(L) 12(L) -  ALT 0 - 44 U/L 9 9 -  Alk Phosphatase 38 - 126 U/L 28(L) 49 -  Total Bilirubin 0.3 - 1.2 mg/dL 0.7 1.1 -   CBC Latest Ref Rng & Units 11/01/2019 10/31/2019 10/30/2019  WBC 4.0 - 10.5 K/uL 4.6 4.6 4.3  Hemoglobin 12.0 - 15.0 g/dL 8.5(L) 9.2(L) 9.7(L)  Hematocrit 36.0 - 46.0 % 25.7(L) 27.7(L) 29.6(L)  Platelets 150 - 400 K/uL 144(L) 160 165   Lipid Panel     Component Value Date/Time   CHOL 157 07/20/2018 1452   TRIG 98 07/20/2018 1452   HDL 42 07/20/2018 1452   CHOLHDL 3.7 07/20/2018 1452   CHOLHDL 4.3 12/10/2013 0856   VLDL 18 12/10/2013 0856   LDLCALC 95 07/20/2018 1452   Cardiac Panel (last 3 results) No results for input(s): CKTOTAL, CKMB, TROPONINI, RELINDX in the last 72 hours.  HEMOGLOBIN A1C No results found for: HGBA1C, MPG  TSH No results for input(s): TSH in the last 8760 hours. Imaging: US Renal  Result Date: 10/31/2019 CLINICAL DATA:  Acute kidney injury, increased BUN and creatinine, history of hypertension and CKD EXAM: RENAL / URINARY TRACT ULTRASOUND COMPLETE COMPARISON:  CT abdomen pelvis 10/29/2019 FINDINGS: Right Kidney: Renal measurements: 9.8 x 4.6 x 6.1 cm = volume: 143.3 mL. Increased renal cortical echogenicity. No concerning renal mass, urolithiasis or hydronephrosis. Left Kidney: Poorly visualized due to bowel gas. Renal measurements: 8.8 x 5.1 x 5.4 cm = volume: 125.7 mL. Increased renal echogenicity. No renal mass, calculus or hydronephrosis in the visualized portions of the left kidney. Bladder: Appears normal for  degree of bladder distention. Other: Nodular hepatic surface contour and moderate volume ascites. IMPRESSION: Bilateral increased renal cortical echogenicity compatible with medical renal disease. Nodular opacity surface contour, can be seen with cirrhosis. Moderate volume ascites. Electronically Signed   By: Lovena Le M.D.   On: 10/31/2019 22:53   US Paracentesis  Result Date: 10/31/2019 INDICATION: Chronic congestive heart failure. Anasarca. Ascites. Request for therapeutic paracentesis up to 5 L. EXAM: ULTRASOUND GUIDED PARACENTESIS MEDICATIONS: 1% lidocaine 15 mL COMPLICATIONS: None immediate. PROCEDURE: Informed written consent was obtained from the patient after a discussion of the risks, benefits and alternatives to treatment. A timeout was performed prior to the initiation of the procedure. Initial ultrasound scanning demonstrates a large amount of ascites within the right lateral abdomen. The right lateral abdomen was prepped and draped in the usual sterile fashion. 1% lidocaine was used for local anesthesia. Following this, a 19 gauge, 10-cm, Yueh catheter was introduced. An ultrasound image was saved for documentation purposes. The paracentesis was performed. The catheter was removed and a dressing was applied. The patient tolerated the procedure well without immediate post procedural complication. FINDINGS: A total of approximately 5 L of clear yellow fluid was removed. IMPRESSION: Successful ultrasound-guided paracentesis yielding 5 liters of peritoneal fluid. Read by: Gareth Eagle, PA-C Electronically Signed   By: Lucrezia Europe M.D.   On: 10/31/2019 10:39   [place] Ekg Site Rite  Result Date: 11/01/2019 If Site Rite image not attached, placement could not be confirmed due to current cardiac rhythm.  [place] Ekg Site Rite  Result Date: 10/31/2019 If Site Rite image not attached, placement could not be confirmed due to current cardiac rhythm.   Cardiac Studies:  EKG 10/29/2019: Normal sinus rhythm  at rate of 80 bpm, normal axis, low voltage complexes otherwise normal EKG.  Echocardiogram 10/30/2019:   1. Left ventricular ejection fraction, by visual estimation, is 55 to 60%. The left ventricle has normal function. There is mildly increased left ventricular hypertrophy.  2. Global right ventricle has low normal systolic function.The right ventricular size is moderately enlarged. Mildly increased right ventricular wall thickness.  3. Left atrial size was normal.  4. Right atrial size was mildly dilated.  5. Compared to previous study in 06/2019, mitral and tricuspid regurgitation not well visualized. IVC not well visualized. Unable to assess pulmonary artery systolic pressure. However, enlarged right ventricle, and flattened interventricular septum and  bowing of interatrial septum to left suggest elevated right sided pressures.  Scheduled Meds: . budesonide  0.25 mg Nebulization BID  . Chlorhexidine Gluconate Cloth  6 each Topical Daily  . heparin  5,000 Units Subcutaneous Q8H  . lidocaine  1 application Urethral Once  . patiromer  8.4 g Oral Daily  . sildenafil  20 mg Oral TID  . sodium chloride flush  10-40 mL Intracatheter Q12H  Continuous Infusions: . sodium chloride    . ferumoxytol 510 mg (10/29/19 2308)  . furosemide 120 mg (11/01/19 0542)  . milrinone 0.125 mcg/kg/min (10/31/19 1828)   PRN Meds:.sodium chloride, acetaminophen, diclofenac sodium, lidocaine, polyethylene glycol, sodium chloride flush  Assessment/Plan:  1.  Acute on chronic cor pulmonale with severe primary pulmonary hypertension. 2.  Acute renal failure secondary to low cardiac output with RV failure  Recommendation: Patient now has a PICC line in place.  I will start her on dobutamine and monitor her urine output closely.  Extremely guarded prognosis.  She will need right heart catheterization which I will schedule either later during the day or tomorrow morning.   Adrian Prows, M.D. 11/01/2019, 1:19  PM [facility], [place] Pager: [phone] Office: [phone] If no answer: [phone]

## 2019-11-01 NOTE — H&P (View-Only) (Signed)
Subjective:  Feels improved with regards to dyspnea, abdominal distention persists.   Intake/Output from previous day:  I/O last 3 completed shifts: In: 934.4 [P.O.:840; I.V.:32.4; IV Piggyback:62] Out: 576 [Urine:576] No intake/output data recorded.  Blood pressure 116/64, pulse 93, temperature 98.4 F (36.9 C), temperature source Oral, resp. rate 18, height _0  (1.626 m), weight 96 kg, SpO2 93 %. Physical Exam  Constitutional: She is oriented to person, place, and time. Vital signs are normal.  She is moderately built and well-nourished, appears chronically ill-looking  HENT:  Head: Normocephalic and atraumatic.  Neck: Normal range of motion. JVD present.  Cardiovascular: Normal rate, regular rhythm, normal heart sounds and intact distal pulses.  Pulses:      Femoral pulses are 0 on the right side and 0 on the left side.      Popliteal pulses are 0 on the right side and 0 on the left side.       Dorsalis pedis pulses are 0 on the right side and 0 on the left side.       Posterior tibial pulses are 0 on the right side and 0 on the left side.  2-3 + bilateral pitting edema. No tenderness  Pulmonary/Chest: Effort normal and breath sounds normal. No accessory muscle usage. No respiratory distress.  Abdominal: Soft. Bowel sounds are normal. She exhibits distension. There is no hepatosplenomegaly.  Umbilical hernia repair. Ascites present  Musculoskeletal: Normal range of motion.  Neurological: She is alert and oriented to person, place, and time.  Skin: Skin is warm and dry.    Lab Results: BMP BNP (last 3 results) Recent Labs    09/12/19 0623 09/13/19 0629 10/29/19 1358  BNP 715.6* 636.0* 125.0*    ProBNP (last 3 results) No results for input(s): PROBNP in the last 8760 hours. BMP Latest Ref Rng & Units 11/01/2019 10/31/2019 10/31/2019  Glucose 70 - 99 mg/dL 103(H) 97 98  BUN 8 - 23 mg/dL 100(H) 100(H) 99(H)  Creatinine 0.44 - 1.00 mg/dL 3.29(H) 3.32(H) 3.58(H)   BUN/Creat Ratio 12 - 28 - - -  Sodium 135 - 145 mmol/L 140 137 138  Potassium 3.5 - 5.1 mmol/L 5.2(H) 5.3(H) 5.6(H)  Chloride 98 - 111 mmol/L 114(H) 113(H) 113(H)  CO2 22 - 32 mmol/L 18(L) 17(L) 18(L)  Calcium 8.9 - 10.3 mg/dL 8.5(L) 8.5(L) 8.5(L)   Hepatic Function Latest Ref Rng & Units 10/30/2019 09/10/2019 08/17/2019  Total Protein 6.5 - 8.1 g/dL 6.2(L) 6.7 -  Albumin 3.5 - 5.0 g/dL 2.9(L) 3.1(L) 3.2(L)  AST 15 - 41 U/L 14(L) 12(L) -  ALT 0 - 44 U/L 9 9 -  Alk Phosphatase 38 - 126 U/L 28(L) 49 -  Total Bilirubin 0.3 - 1.2 mg/dL 0.7 1.1 -   CBC Latest Ref Rng & Units 11/01/2019 10/31/2019 10/30/2019  WBC 4.0 - 10.5 K/uL 4.6 4.6 4.3  Hemoglobin 12.0 - 15.0 g/dL 8.5(L) 9.2(L) 9.7(L)  Hematocrit 36.0 - 46.0 % 25.7(L) 27.7(L) 29.6(L)  Platelets 150 - 400 K/uL 144(L) 160 165   Lipid Panel     Component Value Date/Time   CHOL 157 07/20/2018 1452   TRIG 98 07/20/2018 1452   HDL 42 07/20/2018 1452   CHOLHDL 3.7 07/20/2018 1452   CHOLHDL 4.3 12/10/2013 0856   VLDL 18 12/10/2013 0856   LDLCALC 95 07/20/2018 1452   Cardiac Panel (last 3 results) No results for input(s): CKTOTAL, CKMB, TROPONINI, RELINDX in the last 72 hours.  HEMOGLOBIN A1C No results found for: HGBA1C, MPG  TSH No results for input(s): TSH in the last 8760 hours. Imaging: US Renal  Result Date: 10/31/2019 CLINICAL DATA:  Acute kidney injury, increased BUN and creatinine, history of hypertension and CKD EXAM: RENAL / URINARY TRACT ULTRASOUND COMPLETE COMPARISON:  CT abdomen pelvis 10/29/2019 FINDINGS: Right Kidney: Renal measurements: 9.8 x 4.6 x 6.1 cm = volume: 143.3 mL. Increased renal cortical echogenicity. No concerning renal mass, urolithiasis or hydronephrosis. Left Kidney: Poorly visualized due to bowel gas. Renal measurements: 8.8 x 5.1 x 5.4 cm = volume: 125.7 mL. Increased renal echogenicity. No renal mass, calculus or hydronephrosis in the visualized portions of the left kidney. Bladder: Appears normal for  degree of bladder distention. Other: Nodular hepatic surface contour and moderate volume ascites. IMPRESSION: Bilateral increased renal cortical echogenicity compatible with medical renal disease. Nodular opacity surface contour, can be seen with cirrhosis. Moderate volume ascites. Electronically Signed   By: Lovena Le M.D.   On: 10/31/2019 22:53   US Paracentesis  Result Date: 10/31/2019 INDICATION: Chronic congestive heart failure. Anasarca. Ascites. Request for therapeutic paracentesis up to 5 L. EXAM: ULTRASOUND GUIDED PARACENTESIS MEDICATIONS: 1% lidocaine 15 mL COMPLICATIONS: None immediate. PROCEDURE: Informed written consent was obtained from the patient after a discussion of the risks, benefits and alternatives to treatment. A timeout was performed prior to the initiation of the procedure. Initial ultrasound scanning demonstrates a large amount of ascites within the right lateral abdomen. The right lateral abdomen was prepped and draped in the usual sterile fashion. 1% lidocaine was used for local anesthesia. Following this, a 19 gauge, 10-cm, Yueh catheter was introduced. An ultrasound image was saved for documentation purposes. The paracentesis was performed. The catheter was removed and a dressing was applied. The patient tolerated the procedure well without immediate post procedural complication. FINDINGS: A total of approximately 5 L of clear yellow fluid was removed. IMPRESSION: Successful ultrasound-guided paracentesis yielding 5 liters of peritoneal fluid. Read by: Gareth Eagle, PA-C Electronically Signed   By: Lucrezia Europe M.D.   On: 10/31/2019 10:39   [place] Ekg Site Rite  Result Date: 11/01/2019 If Site Rite image not attached, placement could not be confirmed due to current cardiac rhythm.  [place] Ekg Site Rite  Result Date: 10/31/2019 If Site Rite image not attached, placement could not be confirmed due to current cardiac rhythm.   Cardiac Studies:  EKG 10/29/2019: Normal sinus rhythm  at rate of 80 bpm, normal axis, low voltage complexes otherwise normal EKG.  Echocardiogram 10/30/2019:   1. Left ventricular ejection fraction, by visual estimation, is 55 to 60%. The left ventricle has normal function. There is mildly increased left ventricular hypertrophy.  2. Global right ventricle has low normal systolic function.The right ventricular size is moderately enlarged. Mildly increased right ventricular wall thickness.  3. Left atrial size was normal.  4. Right atrial size was mildly dilated.  5. Compared to previous study in 06/2019, mitral and tricuspid regurgitation not well visualized. IVC not well visualized. Unable to assess pulmonary artery systolic pressure. However, enlarged right ventricle, and flattened interventricular septum and  bowing of interatrial septum to left suggest elevated right sided pressures.  Scheduled Meds: . budesonide  0.25 mg Nebulization BID  . Chlorhexidine Gluconate Cloth  6 each Topical Daily  . heparin  5,000 Units Subcutaneous Q8H  . lidocaine  1 application Urethral Once  . patiromer  8.4 g Oral Daily  . sildenafil  20 mg Oral TID  . sodium chloride flush  10-40 mL Intracatheter Q12H  Continuous Infusions: . sodium chloride    . ferumoxytol 510 mg (10/29/19 2308)  . furosemide 120 mg (11/01/19 0542)  . milrinone 0.125 mcg/kg/min (10/31/19 1828)   PRN Meds:.sodium chloride, acetaminophen, diclofenac sodium, lidocaine, polyethylene glycol, sodium chloride flush  Assessment/Plan:  1.  Acute on chronic cor pulmonale with severe primary pulmonary hypertension. 2.  Acute renal failure secondary to low cardiac output with RV failure  Recommendation: Patient now has a PICC line in place.  I will start her on dobutamine and monitor her urine output closely.  Extremely guarded prognosis.  She will need right heart catheterization which I will schedule either later during the day or tomorrow morning.   Adrian Prows, M.D. 11/01/2019, 1:19  PM [facility], [place] Pager: [phone] Office: [phone] If no answer: [phone]

## 2019-11-01 NOTE — Progress Notes (Signed)
Peripherally Inserted Central Catheter/Midline Placement  The IV Nurse has discussed with the patient and/or persons authorized to consent for the patient, the purpose of this procedure and the potential benefits and risks involved with this procedure.  The benefits include less needle sticks, lab draws from the catheter, and the patient may be discharged home with the catheter. Risks include, but not limited to, infection, bleeding, blood clot (thrombus formation), and puncture of an artery; nerve damage and irregular heartbeat and possibility to perform a PICC exchange if needed/ordered by physician.  Alternatives to this procedure were also discussed.  Bard Power PICC patient education guide, fact sheet on infection prevention and patient information card has been provided to patient /or left at bedside.    PICC/Midline Placement Documentation  PICC Double Lumen 11/01/19 PICC Right Brachial 36 cm 0 cm (Active)  Indication for Insertion or Continuance of Line Vasoactive infusions 11/01/19 1149  Exposed Catheter (cm) 0 cm 11/01/19 1149  Site Assessment Clean;Dry;Intact 11/01/19 1149  Lumen #1 Status Flushed;Blood return noted 11/01/19 1149  Lumen #2 Status Flushed;Blood return noted 11/01/19 1149  Dressing Type Transparent 11/01/19 1149  Dressing Status Clean;Dry;Intact;Antimicrobial disc in place;Other (Comment) 11/01/19 1149  Dressing Intervention New dressing 11/01/19 1149  Dressing Change Due 11/08/19 11/01/19 1149       Christella Noa Albarece 11/01/2019, 11:51 AM

## 2019-11-01 NOTE — Progress Notes (Signed)
Admit: 10/29/2019 LOS: 3  30F AoCKD 3, COPD with pulmonary hypertension and right-sided heart failure, decompensated.  Subjective:  . Followed by palliative care . 5 L paracentesis yesterday . 0.5 L UOP yesterday . Currently on Lasix 120 IV 3 times daily . Weight down 3 kg yesterday . Discussed long-term dialysis with patient, as a follow-up to her conversation yesterday evening with Dr. Jonnie Finner, she acknowledges that long-term HD would be difficult/challenging/unlikely to be restorative.  11/08 0701 - 11/09 0700 In: 934.4 [P.O.:840; I.V.:32.4; IV Piggyback:62] Out: 476 [Urine:476]  Filed Weights   10/30/19 0049 10/31/19 0300 11/01/19 0600  Weight: 91.8 kg 99.9 kg 96 kg    Scheduled Meds: . budesonide  0.25 mg Nebulization BID  . Chlorhexidine Gluconate Cloth  6 each Topical Daily  . heparin  5,000 Units Subcutaneous Q8H  . lidocaine  1 application Urethral Once  . sildenafil  20 mg Oral TID  . sodium chloride flush  10-40 mL Intracatheter Q12H  . sodium zirconium cyclosilicate  5 g Oral TID   Continuous Infusions: . sodium chloride    . ferumoxytol 510 mg (10/29/19 2308)  . furosemide 120 mg (11/01/19 0542)  . milrinone 0.125 mcg/kg/min (10/31/19 1828)   PRN Meds:.sodium chloride, acetaminophen, diclofenac sodium, lidocaine, polyethylene glycol, sodium chloride flush  Current Labs: reviewed    Physical Exam:  Blood pressure 116/64, pulse 93, temperature 98.4 F (36.9 C), temperature source Oral, resp. rate 18, height 5\' 4"  (1.626 m), weight 96 kg, SpO2 93 %. Chronically ill-appearing, NAD, lying flat in bed Abdomen soft, distended, ascites present Regular, no murmur or rub Clear breath sounds Foley catheter in place 3+ peripheral edema to the hips Nonfocal, CN II through XII grossly intact  A/P 1. AO CKD 3, likely cardiorenal from severe right heart failure.  Stable BUN and creatinine, K5.2 today.  On 3 times daily Lasix and milrinone, continue to follow.  Patient  poor candidate for outpatient dialysis given her right heart failure, discussed again with patient who seems to understand.  Renal ultrasound medical renal disease, no acute structural issues. 2. Right heart failure/cor pulmonale, on milrinone, trying to decompress her diuretics, status post paracentesis, cardiology following 3. COPD, on chronic oxygen 4. Mild hyperkalemia, on Lokelma, will switch to [facility] given that low, has a significant sodium load. 5.   Pearson Grippe MD 11/01/2019, 12:56 PM  Recent Labs  Lab 10/31/19 0344 10/31/19 1649 11/01/19 0406  NA 138 137 140  K 5.6* 5.3* 5.2*  CL 113* 113* 114*  CO2 18* 17* 18*  GLUCOSE 98 97 103*  BUN 99* 100* 100*  CREATININE 3.58* 3.32* 3.29*  CALCIUM 8.5* 8.5* 8.5*   Recent Labs  Lab 10/30/19 0518 10/31/19 0344 11/01/19 0406  WBC 4.3 4.6 4.6  HGB 9.7* 9.2* 8.5*  HCT 29.6* 27.7* 25.7*  MCV 103.9* 106.9* 106.2*  PLT 165 160 144*

## 2019-11-02 ENCOUNTER — Encounter (HOSPITAL_COMMUNITY)
Admission: EM | Disposition: A | Discharge status: Skilled Nursing Facility | Payer: Self-pay | Source: Home / Self Care | Attending: Student in an Organized Health Care Education/Training Program

## 2019-11-02 ENCOUNTER — Inpatient Hospital Stay (HOSPITAL_COMMUNITY): Payer: No Typology Code available for payment source

## 2019-11-02 DIAGNOSIS — I272 Pulmonary hypertension, unspecified: Secondary | ICD-10-CM

## 2019-11-02 DIAGNOSIS — K769 Liver disease, unspecified: Secondary | ICD-10-CM

## 2019-11-02 DIAGNOSIS — I509 Heart failure, unspecified: Secondary | ICD-10-CM

## 2019-11-02 DIAGNOSIS — I5033 Acute on chronic diastolic (congestive) heart failure: Secondary | ICD-10-CM

## 2019-11-02 DIAGNOSIS — N182 Chronic kidney disease, stage 2 (mild): Secondary | ICD-10-CM

## 2019-11-02 DIAGNOSIS — K7290 Hepatic failure, unspecified without coma: Secondary | ICD-10-CM | POA: Diagnosis present

## 2019-11-02 DIAGNOSIS — K7469 Other cirrhosis of liver: Secondary | ICD-10-CM | POA: Diagnosis present

## 2019-11-02 DIAGNOSIS — N179 Acute kidney failure, unspecified: Secondary | ICD-10-CM

## 2019-11-02 HISTORY — PX: RIGHT HEART CATH: CATH118263

## 2019-11-02 LAB — CBC
HCT: 23.6 % — ABNORMAL LOW (ref 36.0–46.0)
Hemoglobin: 8 g/dL — ABNORMAL LOW (ref 12.0–15.0)
MCH: 35.9 pg — ABNORMAL HIGH (ref 26.0–34.0)
MCHC: 33.9 g/dL (ref 30.0–36.0)
MCV: 105.8 fL — ABNORMAL HIGH (ref 80.0–100.0)
Platelets: 123 10*3/uL — ABNORMAL LOW (ref 150–400)
RBC: 2.23 MIL/uL — ABNORMAL LOW (ref 3.87–5.11)
RDW: 17.2 % — ABNORMAL HIGH (ref 11.5–15.5)
WBC: 4.3 10*3/uL (ref 4.0–10.5)
nRBC: 0 % (ref 0.0–0.2)

## 2019-11-02 LAB — BASIC METABOLIC PANEL
Anion gap: 8 (ref 5–15)
BUN: 100 mg/dL — ABNORMAL HIGH (ref 8–23)
CO2: 17 mmol/L — ABNORMAL LOW (ref 22–32)
Calcium: 8.4 mg/dL — ABNORMAL LOW (ref 8.9–10.3)
Chloride: 113 mmol/L — ABNORMAL HIGH (ref 98–111)
Creatinine, Ser: 3.06 mg/dL — ABNORMAL HIGH (ref 0.44–1.00)
GFR calc Af Amer: 18 mL/min — ABNORMAL LOW (ref >60)
GFR calc non Af Amer: 15 mL/min — ABNORMAL LOW (ref >60)
Glucose, Bld: 100 mg/dL — ABNORMAL HIGH (ref 70–99)
Potassium: 4.9 mmol/L (ref 3.5–5.1)
Sodium: 138 mmol/L (ref 135–145)

## 2019-11-02 LAB — POCT I-STAT EG7
Acid-base deficit: 7 mmol/L — ABNORMAL HIGH (ref 0.0–2.0)
Acid-base deficit: 8 mmol/L — ABNORMAL HIGH (ref 0.0–2.0)
Acid-base deficit: 8 mmol/L — ABNORMAL HIGH (ref 0.0–2.0)
Acid-base deficit: 8 mmol/L — ABNORMAL HIGH (ref 0.0–2.0)
Bicarbonate: 18.4 mmol/L — ABNORMAL LOW (ref 20.0–28.0)
Bicarbonate: 18.7 mmol/L — ABNORMAL LOW (ref 20.0–28.0)
Bicarbonate: 18.7 mmol/L — ABNORMAL LOW (ref 20.0–28.0)
Bicarbonate: 18.9 mmol/L — ABNORMAL LOW (ref 20.0–28.0)
Calcium, Ion: 1.21 mmol/L (ref 1.15–1.40)
Calcium, Ion: 1.27 mmol/L (ref 1.15–1.40)
Calcium, Ion: 1.29 mmol/L (ref 1.15–1.40)
Calcium, Ion: 1.31 mmol/L (ref 1.15–1.40)
HCT: 19 % — ABNORMAL LOW (ref 36.0–46.0)
HCT: 19 % — ABNORMAL LOW (ref 36.0–46.0)
HCT: 20 % — ABNORMAL LOW (ref 36.0–46.0)
HCT: 23 % — ABNORMAL LOW (ref 36.0–46.0)
Hemoglobin: 6.5 g/dL — CL (ref 12.0–15.0)
Hemoglobin: 6.5 g/dL — CL (ref 12.0–15.0)
Hemoglobin: 6.8 g/dL — CL (ref 12.0–15.0)
Hemoglobin: 7.8 g/dL — ABNORMAL LOW (ref 12.0–15.0)
O2 Saturation: 66 %
O2 Saturation: 66 %
O2 Saturation: 67 %
O2 Saturation: 72 %
Potassium: 4.5 mmol/L (ref 3.5–5.1)
Potassium: 4.8 mmol/L (ref 3.5–5.1)
Potassium: 4.8 mmol/L (ref 3.5–5.1)
Potassium: 4.8 mmol/L (ref 3.5–5.1)
Sodium: 141 mmol/L (ref 135–145)
Sodium: 141 mmol/L (ref 135–145)
Sodium: 142 mmol/L (ref 135–145)
Sodium: 143 mmol/L (ref 135–145)
TCO2: 20 mmol/L — ABNORMAL LOW (ref 22–32)
TCO2: 20 mmol/L — ABNORMAL LOW (ref 22–32)
TCO2: 20 mmol/L — ABNORMAL LOW (ref 22–32)
TCO2: 20 mmol/L — ABNORMAL LOW (ref 22–32)
pCO2, Ven: 40.5 mmHg — ABNORMAL LOW (ref 44.0–60.0)
pCO2, Ven: 40.6 mmHg — ABNORMAL LOW (ref 44.0–60.0)
pCO2, Ven: 40.7 mmHg — ABNORMAL LOW (ref 44.0–60.0)
pCO2, Ven: 41.6 mmHg — ABNORMAL LOW (ref 44.0–60.0)
pH, Ven: 7.259 (ref 7.250–7.430)
pH, Ven: 7.265 (ref 7.250–7.430)
pH, Ven: 7.272 (ref 7.250–7.430)
pH, Ven: 7.275 (ref 7.250–7.430)
pO2, Ven: 39 mmHg (ref 32.0–45.0)
pO2, Ven: 39 mmHg (ref 32.0–45.0)
pO2, Ven: 40 mmHg (ref 32.0–45.0)
pO2, Ven: 43 mmHg (ref 32.0–45.0)

## 2019-11-02 LAB — POCT I-STAT, CHEM 8
BUN: 117 mg/dL — ABNORMAL HIGH (ref 8–23)
Calcium, Ion: 1.29 mmol/L (ref 1.15–1.40)
Chloride: 110 mmol/L (ref 98–111)
Creatinine, Ser: 3.2 mg/dL — ABNORMAL HIGH (ref 0.44–1.00)
Glucose, Bld: 95 mg/dL (ref 70–99)
HCT: 20 % — ABNORMAL LOW (ref 36.0–46.0)
Hemoglobin: 6.8 g/dL — CL (ref 12.0–15.0)
Potassium: 4.8 mmol/L (ref 3.5–5.1)
Sodium: 142 mmol/L (ref 135–145)
TCO2: 19 mmol/L — ABNORMAL LOW (ref 22–32)

## 2019-11-02 LAB — GLUCOSE, CAPILLARY
Glucose-Capillary: 96 mg/dL (ref 70–99)
Glucose-Capillary: 95 mg/dL (ref 70–99)
Glucose-Capillary: 97 mg/dL (ref 70–99)
Glucose-Capillary: 97 mg/dL (ref 70–99)

## 2019-11-02 LAB — COOXEMETRY PANEL
Carboxyhemoglobin: 1.5 % (ref 0.5–1.5)
Methemoglobin: 0.6 % (ref 0.0–1.5)
O2 Saturation: 82.8 %
Total hemoglobin: 8.2 g/dL — ABNORMAL LOW (ref 12.0–16.0)

## 2019-11-02 SURGERY — RIGHT HEART CATH

## 2019-11-02 MED ORDER — METOLAZONE 5 MG PO TABS
10.0000 mg | ORAL_TABLET | Freq: Every day | ORAL | Status: DC
  Administered 2019-11-02 – 2019-11-07 (×6): 10 mg via ORAL
  Filled 2019-11-02 (×6): qty 2

## 2019-11-02 MED ORDER — SODIUM CHLORIDE 0.9% FLUSH: 3.0000 mL | INTRAVENOUS | Status: DC | PRN

## 2019-11-02 MED ORDER — LIDOCAINE HCL (PF) 1 % IJ SOLN
INTRAMUSCULAR | Status: DC | PRN
  Administered 2019-11-02: 2 mL

## 2019-11-02 MED ORDER — DOBUTAMINE IN D5W 4-5 MG/ML-% IV SOLN: 5.0000 ug/kg/min | INTRAVENOUS | Status: DC

## 2019-11-02 MED ORDER — SODIUM CHLORIDE 0.9 % IV SOLN: INTRAVENOUS | Status: DC

## 2019-11-02 MED ORDER — HEPARIN (PORCINE) IN NACL 1000-0.9 UT/500ML-% IV SOLN
INTRAVENOUS | Status: AC
  Filled 2019-11-02: qty 500

## 2019-11-02 MED ORDER — FUROSEMIDE 10 MG/ML IJ SOLN
160.0000 mg | Freq: Three times a day (TID) | INTRAVENOUS | Status: DC
  Administered 2019-11-02 – 2019-11-07 (×13): 160 mg via INTRAVENOUS
  Filled 2019-11-02 (×3): qty 16
  Filled 2019-11-02: qty 10
  Filled 2019-11-02 (×6): qty 16
  Filled 2019-11-02: qty 14
  Filled 2019-11-02 (×4): qty 16
  Filled 2019-11-02: qty 10

## 2019-11-02 MED ORDER — LIDOCAINE HCL (PF) 1 % IJ SOLN
INTRAMUSCULAR | Status: AC
  Filled 2019-11-02: qty 30

## 2019-11-02 MED ORDER — SODIUM CHLORIDE 0.9 % IV SOLN: 250.0000 mL | INTRAVENOUS | Status: DC | PRN

## 2019-11-02 SURGICAL SUPPLY — 6 items
CATH BALLN WEDGE 5F 110CM (CATHETERS) ×1 IMPLANT
PACK CARDIAC CATHETERIZATION (CUSTOM PROCEDURE TRAY) ×1 IMPLANT
SHEATH GLIDE SLENDER 4/5FR (SHEATH) ×1 IMPLANT
TRANSDUCER W/STOPCOCK (MISCELLANEOUS) ×1 IMPLANT
TUBING ART PRESS 72  MALE/FEM (TUBING) ×1
TUBING ART PRESS 72 MALE/FEM (TUBING) IMPLANT

## 2019-11-02 NOTE — Consult Note (Addendum)
Advanced Heart Failure Team Consult Note   Primary Physician: Patient, No Pcp Per PCP-Cardiologist:  Dr Nadyne Coombes Nephrology: Dr Moshe Cipro  Reason for Consultation: Heart Failure   HPI:    Vanessa Williamson is seen today for evaluation of heart failure at the request of  Dr Nadyne Coombes.   Ms Harling is a 63 year old with a history of pulmonary hypertension, obesity, CKD Stage III, COPD on 3 liters oxygen, HTN, GERD, iron deficiency admitted with increased dyspnea. She was on sildenafil for PAH and had recently started on opsumit but had only had one dose.     Admitted in 9/50/93267 with A/C Diastolic HF. Diuresed with IV lasix + dobutamine. Had LHC/RHC with elevated PVR. Thought to have primary HTN suspected WHO Group I. Placed on sildenafil 20 mg three times a day. VQ was negative for PE. Discharged on home oxygen.  Home diuretic regimen was torsemide 20 mg twice a day. Discharge weight was 212 pounds.   Prior to admit she had progressive functional decline. Says she was having multiple falls at home and was unable to get back up. Last week she cut back her medication dosages. Lives alone. Works at [employer] and has been working  at home.   Presented to [facility] ED with increased dyspnea and edema. CXR on admit underlying interstitial fibrosis but negative for edema. Pertinent admission labs included K 6, creatinine 3.12, BNP 125, SARS Covid 19 negative, and hgb 9.7. Cardiology consulted. Started on IV lasix. Opsumit was stopped on admit due to hypotension. Lasix was stopped on 11/8 due to oliguria. Nephrology was consulted with recommendations for inotropes and diuretics. Renal US showed moderate ascites, renal cortical echogenicity, and possible cirrhosis. Also had  paracentesis with 5 liters removed. Switched to milrinone and has been diuresing with high dose IV lasix + metolazone. Today she had RHC as noted below with preserved cardiac output and stable filling pressures. Milrinone was stopped post  cath.   Complaining of weakness. SOB with exertion.   RHC 11/02/19  RA 8 PA 47/16 (30) PCWP 15 PVR 2 CO 7.3 CI 3.6   RHC [facility] 09/14/19 Nonobstructive CAD RA 7 PA 63/24 (39)  PCWP 13 PVR 4.3  CO 6 CI 3   Echo 06/2019  EF 50-55%, RV moderately reduced, LA/RA moderately dilated.   Review of Systems: [y] = yes, [ ]  = no    General: Weight gain [ ] ; Weight loss [ ] ; Anorexia [ ] ; Fatigue [Y ]; Fever [ ] ; Chills [ ] ; Weakness [ ]    Cardiac: Chest pain/pressure [ ] ; Resting SOB [ ] ; Exertional SOB [Y ]; Orthopnea [ ] ; Pedal Edema [Y ]; Palpitations [ ] ; Syncope [ ] ; Presyncope [ ] ; Paroxysmal nocturnal dyspnea[ ]    Pulmonary: Cough [ ] ; Wheezing[ ] ; Hemoptysis[ ] ; Sputum [ ] ; Snoring [ ]    GI: Vomiting[ ] ; Dysphagia[ ] ; Melena[ ] ; Hematochezia [ ] ; Heartburn[ ] ; Abdominal pain [ ] ; Constipation [ ] ; Diarrhea [ ] ; BRBPR [ ]    GU: Hematuria[ ] ; Dysuria [ ] ; Nocturia[ ]    Vascular: Pain in legs with walking [ ] ; Pain in feet with lying flat [ ] ; Non-healing sores [ ] ; Stroke [ ] ; TIA [ ] ; Slurred speech [ ] ;   Neuro: Headaches[ ] ; Vertigo[ ] ; Seizures[ ] ; Paresthesias[ ] ;Blurred vision [ ] ; Diplopia [ ] ; Vision changes [ ]    Ortho/Skin: Arthritis [ ] ; Joint pain [ ] ; Muscle pain [ ] ; Joint swelling [ ] ; Back Pain [ Y];  Rash [ ]    Psych: Depression[Y ]; Anxiety[ ]    Heme: Bleeding problems [ ] ; Clotting disorders [ ] ; Anemia [ ]    Endocrine: Diabetes [ ] ; Thyroid dysfunction[ ]   Home Medications Prior to Admission medications   Medication Sig Start Date End Date Taking? Authorizing Provider  acetaminophen (TYLENOL) 500 MG tablet Take 500-1,000 mg by mouth every 6 (six) hours as needed for mild pain or headache.   Yes [provider]  amLODipine (NORVASC) 5 MG tablet TAKE 1 TABLET BY MOUTH EVERY DAY Patient taking differently: Take 5 mg by mouth daily.  10/11/19  Yes Miquel Dunn, NP  aspirin EC 81 MG EC tablet Take 1 tablet (81 mg total) by mouth daily.  09/17/19  Yes Adrian Prows, MD  fluticasone (FLONASE) 50 MCG/ACT nasal spray Place 1-2 sprays into both nostrils daily as needed for allergies or rhinitis.   Yes [provider]  hydrALAZINE (APRESOLINE) 25 MG tablet Take 1 tablet (25 mg total) by mouth 3 (three) times daily. 09/16/19 12/15/19 Yes Adrian Prows, MD  loratadine (CLARITIN) 10 MG tablet Take 10 mg by mouth daily as needed for allergies or rhinitis.    Yes [provider]  macitentan (OPSUMIT) 10 MG tablet Take 1 tablet (10 mg total) by mouth daily. 10/04/19  Yes Miquel Dunn, NP  meloxicam (MOBIC) 7.5 MG tablet Take 7.5 mg by mouth daily as needed for pain.   Yes [provider]  metoprolol succinate (TOPROL-XL) 25 MG 24 hr tablet Take 1 tablet (25 mg total) by mouth daily. 10/28/19  Yes Miquel Dunn, NP  OXYGEN Inhale 3 L/min into the lungs continuous.   Yes [provider]  potassium chloride (KLOR-CON M10) 10 MEQ tablet Take 10 mEq by mouth 2 (two) times daily.   Yes [provider]  sildenafil (REVATIO) 20 MG tablet TAKE 1 TABLET BY MOUTH THREE TIMES A DAY Patient taking differently: Take 20 mg by mouth 3 (three) times daily.  10/11/19  Yes Miquel Dunn, NP  spironolactone (ALDACTONE) 50 MG tablet Take 1 tablet (50 mg total) by mouth daily. 10/28/19  Yes Miquel Dunn, NP  torsemide (DEMADEX) 20 MG tablet TAKE 1 TABLET (20 MG TOTAL) BY MOUTH 2 (TWO) TIMES DAILY WITH A MEAL. 10/11/19  Yes Miquel Dunn, NP  acetaminophen (TYLENOL) 325 MG tablet Take 2 tablets (650 mg total) by mouth every 4 (four) hours as needed for headache or mild pain. Patient not taking: Reported on 10/29/2019 09/16/19   Adrian Prows, MD  budesonide (PULMICORT) 0.25 MG/2ML nebulizer solution Take 2 mLs (0.25 mg total) by nebulization 2 (two) times daily. 09/16/19   Adrian Prows, MD    Past Medical History: Past Medical History:  Diagnosis Date   Anemia    Cancer ([place])    cervical   1970s    Chronic kidney disease    COPD (chronic obstructive pulmonary disease) ([place])    GERD (gastroesophageal reflux disease)    Hypertension     Past Surgical History: Past Surgical History:  Procedure Laterality Date   BREAST BIOPSY Left    benign   CERVIX LESION DESTRUCTION     frozen   COLONOSCOPY  03/09/2008   EYE SURGERY     laser   FRACTURE SURGERY     HUMERUS FRACTURE SURGERY     left  2005   RIGHT/LEFT HEART CATH AND CORONARY ANGIOGRAPHY N/A 09/14/2019   Procedure: RIGHT/LEFT HEART CATH AND CORONARY ANGIOGRAPHY;  Surgeon:  Adrian Prows, MD;  Location: [place] [facility];  Service: Cardiovascular;  Laterality: N/A;   VENTRAL HERNIA REPAIR N/A 10/16/2017   Procedure: OPEN VENTRAL HERNIA  REPAIR ERAS PATHWAY;  Surgeon: Alphonsa Overall, MD;  Location: [place];  Service: General;  Laterality: N/A;    Family History: Family History  Problem Relation Age of Onset   Hypertension Mother    Heart disease Mother    Hyperlipidemia Mother    Heart disease Father    Hypertension Father    Heart disease Maternal Grandmother    Heart disease Maternal Grandfather    Cancer Sister    Stroke Brother    Hypertension Brother    Colon cancer Neg Hx    Colon polyps Neg Hx    Esophageal cancer Neg Hx    Stomach cancer Neg Hx    Rectal cancer Neg Hx     Social History: Social History   Socioeconomic History   Marital status: Single    Spouse name: Not on file   Number of children: 0   Years of education: Not on file   Highest education level: Not on file  Occupational History   Occupation: IT    Employer: [employer] resource strain: Not on file   Food insecurity    Worry: Not on file    Inability: Not on file   Transportation needs    Medical: Not on file    Non-medical: Not on file  Tobacco Use   Smoking status: Former Smoker    Packs/day: 0.25    Types: Cigarettes    Quit date: 08/2019    Years since  quitting: 0.1   Smokeless tobacco: Never Used   Tobacco comment: been smoking since age 84  Substance and Sexual Activity   Alcohol use: Yes    Comment: rarely   Drug use: No    Comment: 2005   Sexual activity: Never    Birth control/protection: None  Lifestyle   Physical activity    Days per week: Not on file    Minutes per session: Not on file   Stress: Not on file  Relationships   Social connections    Talks on phone: Not on file    Gets together: Not on file    Attends religious service: Not on file    Active member of club or organization: Not on file    Attends meetings of clubs or organizations: Not on file    Relationship status: Not on file  Other Topics Concern   Not on file  Social History Narrative   Single. Education: [employer].    Allergies:  No Known Allergies  Objective:    Vital Signs:   Temp:  [98 F (36.7 C)-98.6 F (37 C)] 98 F (36.7 C) (11/10 0507) Pulse Rate:  [94-117] 94 (11/10 1323) Resp:  [15-20] 17 (11/10 1200) BP: (101-136)/(53-79) 107/66 (11/10 1323) SpO2:  [89 %-100 %] 94 % (11/10 1323) FiO2 (%):  [93 %] 93 % (11/09 2133) Weight:  [98.2 kg] 98.2 kg (11/10 0507) Last BM Date: 11/01/19  Weight change: Filed Weights   10/31/19 0300 11/01/19 0600 11/02/19 0507  Weight: 99.9 kg 96 kg 98.2 kg    Intake/Output:   Intake/Output Summary (Last 24 hours) at 11/02/2019 1453 Last data filed at 11/02/2019 1300 Gross per 24 hour  Intake 291.24 ml  Output 1000 ml  Net -708.76 ml      Physical Exam  General: Appears chronically ill. No resp difficulty HEENT: normal Neck: supple. JVP 11-12  Carotids 2+ bilat; no bruits. No lymphadenopathy or thyromegaly appreciated. Cor: PMI nondisplaced. Regular rate & rhythm. No rubs, gallops or murmurs. Lungs: clear on HFNC 8 liters  Abdomen: soft, nontender, +++ distended. No hepatosplenomegaly. No bruits or masses. Good bowel sounds. Extremities: no cyanosis, clubbing, rash, R and LLE  2+ edema Neuro: alert & orientedx3, cranial nerves grossly intact. moves all 4 extremities w/o difficulty. Affect pleasant   Telemetry  NSR/ST 90-100s   EKG   n/a  Labs   Basic Metabolic Panel: Recent Labs  Lab 10/30/19 0328  10/30/19 1654 10/31/19 0344 10/31/19 1649 11/01/19 0406 11/02/19 0543  NA 140   < > 139 138 137 140 138  K 5.9*   < > 5.8* 5.6* 5.3* 5.2* 4.9  CL 116*   < > 112* 113* 113* 114* 113*  CO2 15*   < > 19* 18* 17* 18* 17*  GLUCOSE 88   < > 97 98 97 103* 100*  BUN 92*   < > 95* 99* 100* 100* 100*  CREATININE 2.96*   < > 3.19* 3.58* 3.32* 3.29* 3.06*  CALCIUM 8.9   < > 8.9 8.5* 8.5* 8.5* 8.4*  MG 2.1  --   --   --   --   --   --    < > = values in this interval not displayed.    Liver Function Tests: Recent Labs  Lab 10/30/19 1041  AST 14*  ALT 9  ALKPHOS 28*  BILITOT 0.7  PROT 6.2*  ALBUMIN 2.9*   No results for input(s): LIPASE, AMYLASE in the last 168 hours. No results for input(s): AMMONIA in the last 168 hours.  CBC: Recent Labs  Lab 10/29/19 1855 10/30/19 0518 10/31/19 0344 11/01/19 0406 11/02/19 1319  WBC 3.7* 4.3 4.6 4.6 4.3  HGB 10.1* 9.7* 9.2* 8.5* 8.0*  HCT 30.9* 29.6* 27.7*   26.3* 25.7* 23.6*  MCV 103.7* 103.9* 106.9* 106.2* 105.8*  PLT 170 165 160 144* 123*    Cardiac Enzymes: No results for input(s): CKTOTAL, CKMB, CKMBINDEX, TROPONINI in the last 168 hours.  BNP: BNP (last 3 results) Recent Labs    09/12/19 0623 09/13/19 0629 10/29/19 1358  BNP 715.6* 636.0* 125.0*    ProBNP (last 3 results) No results for input(s): PROBNP in the last 8760 hours.   CBG: Recent Labs  Lab 11/01/19 0539 11/01/19 2027 11/01/19 2350 11/02/19 0458 11/02/19 0758  GLUCAP 112* 111* 102* 96 95    Coagulation Studies: No results for input(s): LABPROT, INR in the last 72 hours.   Imaging   No results found.   Medications:     Current Medications:  budesonide  0.25 mg Nebulization BID   Chlorhexidine Gluconate  Cloth  6 each Topical Daily   heparin  5,000 Units Subcutaneous Q8H   lidocaine  1 application Urethral Once   metolazone  10 mg Oral Daily   patiromer  8.4 g Oral Daily   sildenafil  20 mg Oral TID   sodium chloride flush  10-40 mL Intracatheter Q12H   sodium chloride flush  3 mL Intravenous Q12H    Infusions:  sodium chloride     ferumoxytol 510 mg (10/29/19 2308)   furosemide         Assessment/Plan   1. A/C Diastolic Heart Failure  ECHO 10/30/19 EF 55-60% RV normal  RHC with stable CVP/PCWP/CO.  She has been on high  dose diuretics per Nephrology. Significant anasarca.  - Add compression wraps to legs   2. [place] PVR 08/2019 4.3. Presume Primary PAH. Started on sildenal I 20 mg three times a day and continuous oxgyen.  RHC today showed PVR was lower at 2.  Combinations WHO Group I and WHO III. Possible cirrhosis. She has not had sleep study due to cost.   VQ negative for PE  3. AKI on CKD Stage II Creatinine peaked 3.58. Creatinine trending down.  Nephrology following. Considering HD .   4. Hyperkalemia Elevated K on admit.  Resolved.   5. Ascites S/P Paracentesis on 11/8 with 5 liters removed.   6. Severe Deconditioning.   7. Possible Cirrhosis Abdominal US pending   Length of Stay: 4  Amy Clegg, NP  11/02/2019, 2:53 PM  Advanced Heart Failure Team Pager [phone] (M-F; 7a - 4p)  Please contact [facility] for night-coverage after hours (4p -7a ) and weekends on [email or web address]  Patient seen and examined with the above-signed Advanced Practice Provider and/or Housestaff. I personally reviewed laboratory data, imaging studies and relevant notes. I independently examined the patient and formulated the important aspects of the plan. I have edited the note to reflect any of my changes or salient points. I have personally discussed the plan with the patient and/or family.  Difficult case. RHC, echo, CT images and lab data reviewed personally.  She has  massive volume overload which is responding poorly to IV diuresis. RHC today with improved hemodynamics and low filling pressures with normal cardiac output despite ongong volume overload. 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 Unfortunately I do not think there is much more that we can offer her from a cardiac standpoint aside from what Dr. Einar Gip (and team) have already done. I have d/w Dr. Joelyn Oms and if response to IV diuretics remains limited she will need HD but I worry about her ability to tolerate long-term HD.   On exam General:  Looks older than stated age. No resp difficulty HEENT: normal x for temporal wasting  Neck: supple. JVP 8-9 Carotids 2+ bilat; no bruits. No lymphadenopathy or thryomegaly appreciated. Cor: PMI nondisplaced. Regular rate & rhythm. 2/6 TR with mildly increased P2 Lungs: clear Abdomen: soft, nontender, ++ distended. No hepatosplenomegaly. No bruits or masses. Good bowel sounds. Extremities: no cyanosis, clubbing, rash, 3+ edema into thighs + UNNA boots Neuro: alert & orientedx3, cranial nerves grossly intact. moves all 4 extremities w/o difficulty. Affect pleasant  Recommendations as above. The HF team will follow from a distance and support in any way we can.   Glori Bickers, MD  5:29 PM

## 2019-11-02 NOTE — Progress Notes (Signed)
Subjective: Pt seen at the bedside this morning. Feels breathing and edema is unchanged. O2 requirement increased to 7L. Pt without acute concerns at this time. Plan for right heart cath this morning.   Objective:  Vital signs in last 24 hours: Vitals:   11/01/19 1937 11/01/19 2133 11/01/19 2350 11/02/19 0507  BP: 136/65  (!) 115/53 119/60  Pulse: (!) 108 (!) 107 (!) 107 (!) 105  Resp:  20    Temp: 98.4 F (36.9 C)  98.6 F (37 C) 98 F (36.7 C)  TempSrc: Oral  Oral Oral  SpO2: 95%  92% (!) 89%  Weight:    98.2 kg  Height:       Physical Exam Vitals signs and nursing note reviewed.  Constitutional:      General: She is not in acute distress.    Appearance: She is ill-appearing (chronically).  Cardiovascular:     Rate and Rhythm: Normal rate and regular rhythm.  Pulmonary:     Comments: On 7L [place] Abdominal:     General: There is distension.     Palpations: Abdomen is soft.  Musculoskeletal:     Right lower leg: Edema present.     Left lower leg: Edema present.  Skin:    General: Skin is warm and dry.  Neurological:     Mental Status: She is alert.   Bedside ultrasound revealing R internal jugular vein mildly congested to 2cm above the clavicle.   Assessment/Plan:  Principal Problem:   Acute on chronic diastolic heart failure (HCC) Active Problems:   Anemia in chronic kidney disease (CKD)   Pulmonary arterial hypertension ([place])   AKI (acute kidney injury) ([place])   Palliative care encounter   Vanessa Williamson a77 year oldFwith significantPMH of HFpEF,WHO group 1pulmonary pretension on OPSUMIT and Uptravi, morbid obesity, chronic kidney disease stage 3, COPD, hypertension, GERD,andiron deficiency anemia who presented withfluid overload and acute on chronic RV failure.   Acute on chronic diastolic heart failure with WHO group 1 PAH Cardiorenal syndrome Pt still fluid overloaded with unchanged LE edema and abdominal distension.Urine output 383cc  yesterday. Point-of-care ultrasound revealing R internal jugular vein that is mildly congested to 2cm above the clavicle.Repeat Echo from 11/7 with LVEF of 55-60%, global RV with low-normal systolic fuction  Cardiology consulted, appreciated their recommendations - planning for RHC this morning - On IV Lasix 120mg  q8h, dobutamine infusion, milrinone infusion  -strict I/Os -supplemental O2 as needed - continue cardiac monitoring and pulse ox - continue sildenafil 20mg  TID - holding home spironolactone  Acute on chronic CKD stage III-IV Cardiorenal syndrome Cr 3.06 << 3.2 (on admission 3.12, with previously baseline of 1.18). Nephrology consulted, appreciated recommendations  - likely cardiorenal from severe R heart failure  - poor dialysis candidate given R heart failure - will continue to monitor withfollow-upBMPs  Hyperkalemia secondary to above- improving K today 4.9 - nephrology treating with [place] (for lower Na load)  Ascites 2/2 Decompensated HF s/p therapeutic paracentesis Abdominal CT remarkable for large amount of abdominopelvic ascites and diffuse anasarca.  She underwent therapeutic paracentesis on 10/31/2019 with 5 L output.  Hypertension - holding home amlodipine, hydralazine, and metoprolol  Iron deficiency anemia Macrocytic Anemia Her B12 was unremarkable at 245.  IV Feraheme infusion on 11/6 - Folate 791 - methylmalonic acid level pending  Diet: low-Na diet Fluids: none DVTppx:heparin 5,000 units subQ q8h CODE STATUS:DNR  Dispo: Anticipated discharge pending improvement in clinical status  [name], MD 11/02/2019, 6:31 AM Pager: [phone]

## 2019-11-02 NOTE — Progress Notes (Signed)
Orthopedic Tech Progress Note Patient Details:  Vanessa Williamson 1956-07-18 [id number]  Ortho Devices Type of Ortho Device: Ace wrap, Unna boot Ortho Device/Splint Location: Bilateral unna boots Ortho Device/Splint Interventions: Application   Post Interventions Patient Tolerated: Well Instructions Provided: Care of device   [name] 11/02/2019, 3:51 PM

## 2019-11-02 NOTE — Progress Notes (Signed)
Received in report from cath lab RN that patient's hemoglobin via I-Stat lab work was 6.8. Notified MD with internal medicine; stat CBC was ordered to verify. Hemoglobin via CBC was 8.0.

## 2019-11-02 NOTE — Interval H&P Note (Signed)
History and Physical Interval Note:  11/02/2019 11:42 AM  Vanessa Williamson  has presented today for surgery, with the diagnosis of heart failure.  The various methods of treatment have been discussed with the patient and family. After consideration of risks, benefits and other options for treatment, the patient has consented to  Procedure(s): RIGHT HEART CATH (N/A) as a surgical intervention.  The patient's history has been reviewed, patient examined, no change in status, stable for surgery.  I have reviewed the patient's chart and labs.  Questions were answered to the patient's satisfaction.     Adrian Prows

## 2019-11-02 NOTE — Progress Notes (Signed)
Admit: 10/29/2019 LOS: 4  26F AoCKD 3, COPD with pulmonary hypertension and right-sided heart failure, decompensated.  Subjective:  . For [place] today . Inadequate diuresis . Stable GFR . Currently on Lasix 120 IV 3 times daily . would be difficult/challenging/unlikely to be restorative.  11/09 0701 - 11/10 0700 In: 291.2 [I.V.:105.2; IV Piggyback:186] Out: 675 [Urine:675]  Filed Weights   10/31/19 0300 11/01/19 0600 11/02/19 0507  Weight: 99.9 kg 96 kg 98.2 kg    Scheduled Meds: . budesonide  0.25 mg Nebulization BID  . Chlorhexidine Gluconate Cloth  6 each Topical Daily  . heparin  5,000 Units Subcutaneous Q8H  . lidocaine  1 application Urethral Once  . patiromer  8.4 g Oral Daily  . sildenafil  20 mg Oral TID  . sodium chloride flush  10-40 mL Intracatheter Q12H  . sodium chloride flush  3 mL Intravenous Q12H   Continuous Infusions: . sodium chloride    . ferumoxytol 510 mg (10/29/19 2308)  . furosemide 120 mg (11/02/19 1246)   PRN Meds:.sodium chloride, acetaminophen, diclofenac sodium, lidocaine, polyethylene glycol, sodium chloride flush  Current Labs: reviewed    Physical Exam:  Blood pressure (!) 126/59, pulse (!) 104, temperature 98 F (36.7 C), temperature source Oral, resp. rate 17, height 5\' 4"  (1.626 m), weight 98.2 kg, SpO2 100 %. Chronically ill-appearing, NAD, lying flat in bed Abdomen soft, distended, ascites present Regular, no murmur or rub Clear breath sounds Foley catheter in place 3+ peripheral edema to the hips Nonfocal, CN II through XII grossly intact  A/P 1. AO CKD 3, likely cardiorenal from severe right heart failure.  Stable BUN and creatinine, not diursing well.  Increase lasix to 160 q8h, add metolazone again today.  Renal ultrasound medical renal disease, no acute structural issues. 2. Right heart failure/cor pulmonale, on milrinone, trying to decompress her diuretics, status post paracentesis, cardiology following, RHC today 3. COPD,  on chronic oxygen 4. Mild hyperkalemia, on [place], will switch to [facility] given that low, has a significant sodium load.  Follow K, might be able to stop if stays < 5   Pearson Grippe MD 11/02/2019, 1:29 PM  Recent Labs  Lab 10/31/19 1649 11/01/19 0406 11/02/19 0543  NA 137 140 138  K 5.3* 5.2* 4.9  CL 113* 114* 113*  CO2 17* 18* 17*  GLUCOSE 97 103* 100*  BUN 100* 100* 100*  CREATININE 3.32* 3.29* 3.06*  CALCIUM 8.5* 8.5* 8.4*   Recent Labs  Lab 10/30/19 0518 10/31/19 0344 11/01/19 0406  WBC 4.3 4.6 4.6  HGB 9.7* 9.2* 8.5*  HCT 29.6* 27.7*  26.3* 25.7*  MCV 103.9* 106.9* 106.2*  PLT 165 160 144*

## 2019-11-03 ENCOUNTER — Inpatient Hospital Stay (HOSPITAL_COMMUNITY): Payer: No Typology Code available for payment source

## 2019-11-03 ENCOUNTER — Encounter (HOSPITAL_COMMUNITY): Payer: Self-pay | Admitting: Cardiology

## 2019-11-03 DIAGNOSIS — I270 Primary pulmonary hypertension: Secondary | ICD-10-CM

## 2019-11-03 DIAGNOSIS — Z7189 Other specified counseling: Secondary | ICD-10-CM

## 2019-11-03 HISTORY — PX: IR PARACENTESIS: IMG2679

## 2019-11-03 LAB — COOXEMETRY PANEL
Carboxyhemoglobin: 2 % — ABNORMAL HIGH (ref 0.5–1.5)
Methemoglobin: 1.3 % (ref 0.0–1.5)
O2 Saturation: 81.5 %
Total hemoglobin: 9 g/dL — ABNORMAL LOW (ref 12.0–16.0)

## 2019-11-03 LAB — BASIC METABOLIC PANEL
Anion gap: 9 (ref 5–15)
BUN: 97 mg/dL — ABNORMAL HIGH (ref 8–23)
CO2: 20 mmol/L — ABNORMAL LOW (ref 22–32)
Calcium: 8.5 mg/dL — ABNORMAL LOW (ref 8.9–10.3)
Chloride: 110 mmol/L (ref 98–111)
Creatinine, Ser: 3.12 mg/dL — ABNORMAL HIGH (ref 0.44–1.00)
GFR calc Af Amer: 17 mL/min — ABNORMAL LOW (ref >60)
GFR calc non Af Amer: 15 mL/min — ABNORMAL LOW (ref >60)
Glucose, Bld: 96 mg/dL (ref 70–99)
Potassium: 4.7 mmol/L (ref 3.5–5.1)
Sodium: 139 mmol/L (ref 135–145)

## 2019-11-03 LAB — HEPATITIS C ANTIBODY: HCV Ab: NONREACTIVE

## 2019-11-03 LAB — COMPREHENSIVE METABOLIC PANEL
ALT: 11 U/L (ref 0–44)
AST: 16 U/L (ref 15–41)
Albumin: 2.7 g/dL — ABNORMAL LOW (ref 3.5–5.0)
Alkaline Phosphatase: 26 U/L — ABNORMAL LOW (ref 38–126)
Anion gap: 8 (ref 5–15)
BUN: 96 mg/dL — ABNORMAL HIGH (ref 8–23)
CO2: 20 mmol/L — ABNORMAL LOW (ref 22–32)
Calcium: 8.5 mg/dL — ABNORMAL LOW (ref 8.9–10.3)
Chloride: 110 mmol/L (ref 98–111)
Creatinine, Ser: 3 mg/dL — ABNORMAL HIGH (ref 0.44–1.00)
GFR calc Af Amer: 18 mL/min — ABNORMAL LOW (ref >60)
GFR calc non Af Amer: 16 mL/min — ABNORMAL LOW (ref >60)
Glucose, Bld: 142 mg/dL — ABNORMAL HIGH (ref 70–99)
Potassium: 4.5 mmol/L (ref 3.5–5.1)
Sodium: 138 mmol/L (ref 135–145)
Total Bilirubin: 0.4 mg/dL (ref 0.3–1.2)
Total Protein: 5.9 g/dL — ABNORMAL LOW (ref 6.5–8.1)

## 2019-11-03 LAB — LACTATE DEHYDROGENASE, PLEURAL OR PERITONEAL FLUID: LD, Fluid: 41 U/L — ABNORMAL HIGH (ref 3–23)

## 2019-11-03 LAB — PROTIME-INR
INR: 1 (ref 0.8–1.2)
Prothrombin Time: 12.9 seconds (ref 11.4–15.2)

## 2019-11-03 LAB — BODY FLUID CELL COUNT WITH DIFFERENTIAL
Eos, Fluid: 0 %
Lymphs, Fluid: 46 %
Monocyte-Macrophage-Serous Fluid: 34 % — ABNORMAL LOW (ref 50–90)
Neutrophil Count, Fluid: 20 % (ref 0–25)
Total Nucleated Cell Count, Fluid: 118 cu mm (ref 0–1000)

## 2019-11-03 LAB — HEPATITIS B SURFACE ANTIGEN: Hepatitis B Surface Ag: NONREACTIVE

## 2019-11-03 LAB — PROTEIN, PLEURAL OR PERITONEAL FLUID: Total protein, fluid: 3.2 g/dL

## 2019-11-03 LAB — GLUCOSE, CAPILLARY
Glucose-Capillary: 100 mg/dL — ABNORMAL HIGH (ref 70–99)
Glucose-Capillary: 108 mg/dL — ABNORMAL HIGH (ref 70–99)
Glucose-Capillary: 119 mg/dL — ABNORMAL HIGH (ref 70–99)
Glucose-Capillary: 91 mg/dL (ref 70–99)
Glucose-Capillary: 98 mg/dL (ref 70–99)

## 2019-11-03 LAB — ALBUMIN, PLEURAL OR PERITONEAL FLUID: Albumin, Fluid: 1.6 g/dL

## 2019-11-03 LAB — HEPATITIS B CORE ANTIBODY, TOTAL: Hep B Core Total Ab: NONREACTIVE

## 2019-11-03 MED ORDER — LIDOCAINE HCL 1 % IJ SOLN
INTRAMUSCULAR | Status: AC
  Filled 2019-11-03: qty 20

## 2019-11-03 MED FILL — Heparin Sod (Porcine)-NaCl IV Soln 1000 Unit/500ML-0.9%: INTRAVENOUS | Qty: 500 | Status: AC

## 2019-11-03 NOTE — Progress Notes (Addendum)
Palliative:  HPI: 63 y.o. female  with past medical history of COPD, pulmonary arterial hypertension, diastolic heart failure, and chronic kidney disease stage III who was admitted on 10/29/2019 with complaints of back and sacral pain,multiple falls at home, hypoxic respiratory failure secondary to diastolic heart failure.  Creatinine was found to be elevated at 3.12 from a baseline of 1.18 with concerns for cardiorenal syndrome. On 11/8 she underwent paracentesis and 5 L of ascites was removed. There are concerns for liver disease. She has had decreased urine output and continued concern with fluid overload.   I met today with [facility]. We were joined by Dr. Joelyn Oms. Vanessa Williamson initially tells me that she feels better today and is relieved that her heart is not as bad as initially thought. However, we are concerned that she has issues with her liver. We discussed the concern with cardiac, renal, and liver issues and fluid retention. Good news is that she diuresed well yesterday and hoping this continues today. We did discuss that we have a long way to go to get rid of her fluid and then the challenge will be maintaining keeping the fluid off. She understands.   Vanessa Williamson has many emotions during our conversation and reminisces about her life and has sadness, anger, questioning, and regret at times. She tells me that she has tried to be a good person and do right and we discussed the unfairness of her situation. She tells me about regret of smoking. I explained to her that I have met many people who have lived well and did everything right and has suffered with serious illness and I have also met horrible people who have lived long and healthy lives. I encouraged her not to read to much into the choices she has made as this does not always lead to what she is going through.   She is tearful telling me that she was struggling at home and was falling often. She is tearful especially that she was unable to care for her dog to  groom and clean them. She misses riding her motorcycle and has always been very independent. She very much wishes to have some more time and is hopeful for some level of improvement and to have a better QOL.   I called friend, William Dalton (with Sontee's permission) and shared above conversation. William Dalton is trying to help Vanessa Williamson come to terms with not being able to return home and to help her with some arrangements. We plan to meet tomorrow 11/04/19 ~10/11 am.   All questions/concerns addressed. Emotional support provided.    Exam: Alert, oriented. No distress. Increased work of breathing with conversation or with activity. Abd distended but soft. BLE edema. Generalized weakness.   Plan: - I will meet again tomorrow morning with patient and friend.  - I will continue to follow and discuss GOC and provide support.   [name], NP Palliative Medicine Team Pager [phone] (Please see [email or web address] for schedule) Team Phone [phone]    Greater than 50%  of this time was spent counseling and coordinating care related to the above assessment and plan

## 2019-11-03 NOTE — Progress Notes (Signed)
Chaplain responded to consult for AD update. Vanessa Williamson's face sheet in her chart shows that she created an AD in 2018. Vanessa Williamson said she does need to update her AD, but does not feel up to doing so today. Vanessa Williamson will call spiritual care when she is ready to update her AD. Chaplain remains available for support as needed.  Chaplain Resident, Evelene Croon, M Div

## 2019-11-03 NOTE — Progress Notes (Signed)
Admit: 10/29/2019 LOS: 5  62F AoCKD 3, COPD with pulmonary hypertension and right-sided heart failure, decompensated.  Subjective:  . Results of RHC suggest no significant decompensated heart failure . Followed in our office by Dr. Moshe Cipro, June 2020 serum creatinine 1.2 with UPC of 3.5, historically negative SPEP . UOP increased to 1.9 L on high-dose Lasix and metolazone, stable on high flow nasal cannula . Continues to have significant peripheral edema, ascites . Potassium stable on [place] . Creatinine stable at 3.1  11/10 0701 - 11/11 0700 In: 262.3 [P.O.:120; I.V.:80.3; IV Piggyback:62] Out: 1975 [BJSEG:3151]  Filed Weights   11/01/19 0600 11/02/19 0507 11/03/19 0421  Weight: 96 kg 98.2 kg 97 kg    Scheduled Meds: . budesonide  0.25 mg Nebulization BID  . Chlorhexidine Gluconate Cloth  6 each Topical Daily  . heparin  5,000 Units Subcutaneous Q8H  . lidocaine  1 application Urethral Once  . metolazone  10 mg Oral Daily  . patiromer  8.4 g Oral Daily  . sildenafil  20 mg Oral TID  . sodium chloride flush  10-40 mL Intracatheter Q12H  . sodium chloride flush  3 mL Intravenous Q12H   Continuous Infusions: . sodium chloride    . ferumoxytol 510 mg (10/29/19 2308)  . furosemide 160 mg (11/03/19 0636)   PRN Meds:.sodium chloride, acetaminophen, diclofenac sodium, lidocaine, polyethylene glycol, sodium chloride flush  Current Labs: reviewed    Physical Exam:  Blood pressure 127/70, pulse 84, temperature 97.9 F (36.6 C), temperature source Oral, resp. rate 17, height 5\' 4"  (1.626 m), weight 97 kg, SpO2 97 %. Chronically ill-appearing, NAD, lying flat in bed Abdomen soft, distended, ascites present Regular, no murmur or rub Clear breath sounds Foley catheter in place 3+ peripheral edema to the hips Nonfocal, CN II through XII grossly intact  A/P 1. AoCKD 3, follows with Dr. Moshe Cipro as outpatient, creatinine 1.2 08/2019.  Historically borderline nephrotic  proteinuria.  As outpatient, thought that heavy use of NSAIDs were contributing.  Here is massively overloaded but appears to have renal/hepatic cause, not cardiac.  Some improvement in volume status with high-dose diuretics, continue for another 24 hours.  Renal ultrasound medical renal disease, no acute structural issues.  Etiology driving this, unclear. 2. Question of right heart failure/cor pulmonale,; RHC reassuring, not in right or left-sided heart failure.   3. COPD, on chronic oxygen 4. Mild hyperkalemia, on [place], will switch to [facility] given that low, has a significant sodium load.  Follow K, might be able to stop if stays < 5   Pearson Grippe MD 11/03/2019, 1:06 PM  Recent Labs  Lab 11/01/19 0406 11/02/19 0543  11/02/19 1205 11/02/19 1207 11/03/19 0647  NA 140 138   < > 142 142 139  K 5.2* 4.9   < > 4.8 4.8 4.7  CL 114* 113*  --   --  110 110  CO2 18* 17*  --   --   --  20*  GLUCOSE 103* 100*  --   --  95 96  BUN 100* 100*  --   --  117* 97*  CREATININE 3.29* 3.06*  --   --  3.20* 3.12*  CALCIUM 8.5* 8.4*  --   --   --  8.5*   < > = values in this interval not displayed.   Recent Labs  Lab 10/31/19 0344 11/01/19 0406  11/02/19 1205 11/02/19 1207 11/02/19 1319  WBC 4.6 4.6  --   --   --  4.3  HGB 9.2* 8.5*   < > 6.5* 6.8* 8.0*  HCT 27.7*  26.3* 25.7*   < > 19.0* 20.0* 23.6*  MCV 106.9* 106.2*  --   --   --  105.8*  PLT 160 144*  --   --   --  123*   < > = values in this interval not displayed.

## 2019-11-03 NOTE — Procedures (Signed)
PROCEDURE SUMMARY:  Successful image-guided paracentesis from the right lateral abdomen.  Yielded 5.0 liters of clear yellow fluid.  No immediate complications.  EBL < 5 mL. Patient tolerated well.   Specimen was sent for labs.  Please see imaging section of Epic for full dictation.   Earley Abide PA-C 11/03/2019 4:17 PM

## 2019-11-03 NOTE — Progress Notes (Signed)
   Subjective:   Vanessa Williamson was sitting up on the side of the bed and then moved to the bed with assistance. She stated that she was feeling unchanged and did not have any acute concerns. She was requesting to eat.   Objective:  Vital signs in last 24 hours: Vitals:   11/02/19 1323 11/02/19 2031 11/03/19 0001 11/03/19 0421  BP: 107/66 111/61 106/82 105/68  Pulse: 94 89 94 94  Resp: 17     Temp: 98.4 F (36.9 C) 98 F (36.7 C) 98.4 F (36.9 C) 98.2 F (36.8 C)  TempSrc: Oral Oral Oral Oral  SpO2: 94% 97% 96% 92%  Weight:    97 kg  Height:       Physical Exam Vitals signs and nursing note reviewed.  Constitutional:      General: She is not in acute distress.    Appearance: She is ill-appearing (chronically).  Pulmonary:     Comments: On 8L [place] with O2 sat >95 Abdominal:     Comments: Abdomen moderately distended. Soft to palpation.  Musculoskeletal:     Right lower leg: Edema present.     Left lower leg: Edema present.     Comments: Lower extremity edema unchanged  Skin:    General: Skin is warm and dry.  Neurological:     Mental Status: She is alert.    Assessment/Plan:  Principal Problem:   Decompensated liver disease (HCC) Active Problems:   Anemia in chronic kidney disease (CKD)   Pulmonary arterial hypertension (HCC)   AKI (acute kidney injury) ([place])   Palliative care encounter  Ms. Gruenewald is a 63 y.o f with pah, hfpef, decompensated live disease, ckd who presented with volume overload and new ascites.   Hypervolemia  Patient is undergoing extensive evaluation for acute volume overload. Right heart cath done 11/10 showing normal pcwp, mild elev pulm pressure, normal co which makes heart failure less likely cause of volume overload. Now in process of evaluation for hepatic decompensation since abdominal ultrasound on admission showed small nodular liver with findings concerning for cirrhosis. Her LFTs from 11/7 were within normal range, plt mildly decreased at  123, and inr 1.0. Patient is to get diagnostic paracentesis today 11/11 with studies (albumin, protein, culture, cell count, ldh, cytology)  -evaluation for decompensated liver disease: hepatitis serologies (hbcab, hbsag, hep c ab), ferritin=521 and iron sat of 120%, hfe genotype,  -further rheumatologic evaluation with antimicrosomal ab, mitochondrial ab, anti smooth muscle, anti LKM. Evaluation for amlyoidosis with ife, kappa/lamda light chains, ib. -evaluation for acute decompensated kidney injury, CKD stage IV: protein/creatinine ratio  -IV Lasix 160mg  q8rhs  -metolazone 10mg  qd   IR consulted for diagnostic paracentesis today with MAX 5L to be removed. Cardiology and nephrology on board, appreciate their input.  PAH type 1 -continue sildenafil 20mg  tid  Diet - heart healthy Fluids - none DVT ppx - heparin 5,000 units subQ q8h CODE STATUS - DNR   Dispo: Anticipated discharge pending clinical improvement   Lars Mage, MD Internal Medicine PGY3 [id number]:[phone] 11/03/2019, 4:32 PM  [name], MD 11/03/2019, 7:00 AM Pager: [phone]

## 2019-11-04 DIAGNOSIS — E8770 Fluid overload, unspecified: Secondary | ICD-10-CM

## 2019-11-04 DIAGNOSIS — R188 Other ascites: Secondary | ICD-10-CM

## 2019-11-04 LAB — GRAM STAIN

## 2019-11-04 LAB — COOXEMETRY PANEL
Carboxyhemoglobin: 1.3 % (ref 0.5–1.5)
Methemoglobin: 0.6 % (ref 0.0–1.5)
O2 Saturation: 79.6 %
Total hemoglobin: 8.9 g/dL — ABNORMAL LOW (ref 12.0–16.0)

## 2019-11-04 LAB — COMPREHENSIVE METABOLIC PANEL
ALT: 9 U/L (ref 0–44)
AST: 18 U/L (ref 15–41)
Albumin: 2.2 g/dL — ABNORMAL LOW (ref 3.5–5.0)
Alkaline Phosphatase: 28 U/L — ABNORMAL LOW (ref 38–126)
Anion gap: 8 (ref 5–15)
BUN: 100 mg/dL — ABNORMAL HIGH (ref 8–23)
CO2: 21 mmol/L — ABNORMAL LOW (ref 22–32)
Calcium: 8.2 mg/dL — ABNORMAL LOW (ref 8.9–10.3)
Chloride: 111 mmol/L (ref 98–111)
Creatinine, Ser: 3 mg/dL — ABNORMAL HIGH (ref 0.44–1.00)
GFR calc Af Amer: 18 mL/min — ABNORMAL LOW (ref >60)
GFR calc non Af Amer: 16 mL/min — ABNORMAL LOW (ref >60)
Glucose, Bld: 97 mg/dL (ref 70–99)
Potassium: 4.4 mmol/L (ref 3.5–5.1)
Sodium: 140 mmol/L (ref 135–145)
Total Bilirubin: 0.5 mg/dL (ref 0.3–1.2)
Total Protein: 5 g/dL — ABNORMAL LOW (ref 6.5–8.1)

## 2019-11-04 LAB — GLUCOSE, CAPILLARY
Glucose-Capillary: 101 mg/dL — ABNORMAL HIGH (ref 70–99)
Glucose-Capillary: 102 mg/dL — ABNORMAL HIGH (ref 70–99)
Glucose-Capillary: 111 mg/dL — ABNORMAL HIGH (ref 70–99)
Glucose-Capillary: 97 mg/dL (ref 70–99)
Glucose-Capillary: 98 mg/dL (ref 70–99)
Glucose-Capillary: 98 mg/dL (ref 70–99)

## 2019-11-04 LAB — KAPPA/LAMBDA LIGHT CHAINS
Kappa free light chain: 203.1 mg/L — ABNORMAL HIGH (ref 3.3–19.4)
Kappa, lambda light chain ratio: 2.15 — ABNORMAL HIGH (ref 0.26–1.65)
Lambda free light chains: 94.3 mg/L — ABNORMAL HIGH (ref 5.7–26.3)

## 2019-11-04 LAB — HEPATITIS B SURFACE ANTIBODY, QUANTITATIVE: Hep B S AB Quant (Post): 5.2 m[IU]/mL — ABNORMAL LOW (ref >9.9)

## 2019-11-04 LAB — ANTI-MICROSOMAL ANTIBODY LIVER / KIDNEY: LKM1 Ab: 1 Units (ref 0.0–20.0)

## 2019-11-04 LAB — PATHOLOGIST SMEAR REVIEW: Path Review: 11122020

## 2019-11-04 LAB — PROTIME-INR
INR: 1.1 (ref 0.8–1.2)
Prothrombin Time: 13.6 seconds (ref 11.4–15.2)

## 2019-11-04 NOTE — Progress Notes (Signed)
   Subjective: Pt seen at the bedside this morning. Feels better regarding her improved abdominal distension after paracentesis yesterday, though deconditioning and dyspnea are unchanged. Remains on 8L [place]. No acute concerns at this time.  Objective:  Vital signs in last 24 hours: Vitals:   11/03/19 1919 11/03/19 2024 11/04/19 0023 11/04/19 0614  BP:  123/60 114/63 120/63  Pulse:  91 93 89  Resp:  20    Temp:  98.4 F (36.9 C) 98.9 F (37.2 C) 98.8 F (37.1 C)  TempSrc:  Oral Oral Oral  SpO2: 96% 100% 99% 100%  Weight:    91.2 kg  Height:       Physical Exam Vitals signs and nursing note reviewed.  Constitutional:      General: She is not in acute distress.    Appearance: She is ill-appearing (chronically).  Pulmonary:     Effort: Pulmonary effort is normal. No respiratory distress.     Comments: On 8L [place] Musculoskeletal:     Right lower leg: Edema present.     Left lower leg: Edema present.     Comments: Improved bilateral leg edema, though thighs remain tight and edematous. Unna boots in place bilaterally.  Skin:    General: Skin is warm and dry.  Neurological:     Mental Status: She is alert.    Assessment/Plan:  Principal Problem:   Decompensated liver disease (HCC) Active Problems:   Anemia in chronic kidney disease (CKD)   Pulmonary arterial hypertension (HCC)   AKI (acute kidney injury) ([place])   Palliative care encounter  Ms. Yale is a 63 year old F with significant PMH of PAH, HFpEF, COPD, HTN, and CKD who presented with dyspnea, volume overload, and new ascites.   Hypervolemia  Cardiology consulted, appreciate their input - Extensive evaluation for acute volume overload ongoing. RHC on 11/10 with normal pcwp, mild elev pulm pressure, and normal CO.   Nephrology consulted, appreciate their recommendations - acute on chronic kidney injury - CKD stage IV - pending protein/creatinine ratio  Abdominal ultrasound on admission showed small nodular liver  with findings concerning for cirrhosis, portal vein patent on Doppler with normal direction of blood flow. Normal LFTs, plt 123, and INR 1.0. MELD 18 and Child-Pugh Class B. Hepatitis B and C serologies negative. Pt with history of iron-deficiency anemia and had received multiple IV Fe transfusions in the past Ferritin 521 and iron sat of 120%, Follow-up HFE genotype.  Paracentesis with IR on 11/11 removed 5L. Fluid analysis with total protein 3.2, albumin 1.6, total cell count 118, and neutrophil 20%. SAAG 1.1 (serum albumin was 2.7). LDH 41. Gram stain, culture, and cytology pending. - consulted GI for potential liver biopsy and evaluation of decompensated liver disease   Pending rheumatologic evaluation with antimicrosomal Ab, mitochondrial Ab, anti-smooth muscle, anti LKM. Ordered IFE, kappa/lamda light chains, quan immunoglobulins to investigate amyloidosis.  - IV Lasix 160mg  q8rhs  - metolazone 10mg  qd  - PT/OT consult - discontinue Foley   PAH - WHO group 1 -continue sildenafil 20mg  tid   Diet - heart healthy Fluids - none DVT ppx - heparin 5,000 units subQ q8h CODE STATUS - DNR   Dispo: Anticipated discharge pending clinical course.  [name], MD 11/04/2019, 6:53 AM Pager: [phone]

## 2019-11-04 NOTE — Progress Notes (Addendum)
Palliative:  HPI: 63 y.o.femalewith past medical history of COPD, pulmonary arterial hypertension, diastolic heart failure, and chronic kidney disease stage IIIwho was admitted on 11/6/2020with complaints of back and sacral pain,multiple falls at home, hypoxic respiratory failure secondary to diastolic heart failure. Creatinine was found to be elevated at 3.12 from a baseline of 1.18 with concerns for cardiorenal syndrome. On 11/8 she underwent paracentesis and 5 L of ascites was removed. There are concerns for liver disease. She has had decreased urine output and continued concern with fluid overload. Now diuresing with slow renal improvement. Overall prognosis poor but she is beginning to improve.   I met today with Vanessa Williamson and her friend, Vanessa Williamson. Vanessa Williamson appears to be in much better spirits today. Updated Vanessa Williamson on current medical condition with heart cath better than anticipated, work up of liver function/disease, and continued diureses although renal function appears to be more stable and not worsening. She continues to diurese well and also had 5L taken off with paracentesis yesterday. She is still requiring 7L oxygen but appears less SOB with conversation and minimal activity in bed than yesterday. We discussed HCPOA and went through Advance Directive packet. She would like to look at this more and complete while she is hospitalized. She confirms DNR and would not want a feeding tube. She is hopeful for continued improvement. Open to rehab if recommended (she has had many falls at home). Her friends are trying to help her to make arrangements for her Living Vanessa Williamson and finances and making plans to ready her house to make this safer for her if she is able to return home.   All questions/concerns addressed. Emotional support provided.   Exam: Alert, oriented. No distress. HR regular. Breathing regular, unlabored at rest. Abd soft and less distended today. Generalized weakness. Temporal muscle wasting.    Plan:  - Chaplain to follow up for HCPOA notarization.  - Palliative to continue to follow for support.   [place], NP Palliative Medicine Team Pager [phone] (Please see [email or web address] for schedule) Team Phone [phone]    Greater than 50%  of this time was spent counseling and coordinating care related to the above assessment and plan

## 2019-11-04 NOTE — Consult Note (Addendum)
Referring Provider:  IM teaching service Primary Care Physician:  Patient, No Pcp Per Primary Gastroenterologist:  Dr. Ardis Hughs   Reason for Consultation:  Cirrhosis, ascites  HPI: Vanessa Williamson is a 63 y.o. female with multiple medical problems including pulmonary arterial hypertension on continuous oxygen, CHF, chronic kidney disease.  She was admitted for fluid overload and ascites November 6.  She is being followed by Dr. Haroldine Laws and the heart failure team as well as nephrology who have been managing her diuretics.  She has now undergone 2 paracenteses, 5 L removed on November 8 and 5 L removed on November 11.  Fluid studies negative for SBP.  An ultrasound was performed of the abdomen on November 10 for evaluation regarding her ascites.  This was noted to find cirrhosis without any evidence of discrete hepatic mass.  GI consult was called due to this new finding/diagnosis in association with this issue of ascites for which she was admitted.  Her INR is 1.1.  Platelets are slightly low at 123.  Iron studies are elevated so they have ordered a hemochromatosis DNA test.  Hepatitis B and C studies are negative.  Internal medicine services also ordered extensive liver serologies including anti-smooth muscle antibody, antimitochondrial antibody, which are pending.   Past Medical History:  Diagnosis Date   Anemia    Cancer ([place])    cervical  1970s    Chronic kidney disease    COPD (chronic obstructive pulmonary disease) (HCC)    GERD (gastroesophageal reflux disease)    Hypertension     Past Surgical History:  Procedure Laterality Date   BREAST BIOPSY Left    benign   CERVIX LESION DESTRUCTION     frozen   COLONOSCOPY  03/09/2008   EYE SURGERY     laser   FRACTURE SURGERY     HUMERUS FRACTURE SURGERY     left  2005   IR PARACENTESIS  11/03/2019   RIGHT HEART CATH N/A 11/02/2019   Procedure: RIGHT HEART CATH;  Surgeon: Adrian Prows, MD;  Location: [facility];   Service: Cardiovascular;  Laterality: N/A;   RIGHT/LEFT HEART CATH AND CORONARY ANGIOGRAPHY N/A 09/14/2019   Procedure: RIGHT/LEFT HEART CATH AND CORONARY ANGIOGRAPHY;  Surgeon: Adrian Prows, MD;  Location: [facility];  Service: Cardiovascular;  Laterality: N/A;   VENTRAL HERNIA REPAIR N/A 10/16/2017   Procedure: [place];  Surgeon: Alphonsa Overall, MD;  Location: [facility];  Service: General;  Laterality: N/A;    Prior to Admission medications   Medication Sig Start Date End Date Taking? Authorizing Provider  acetaminophen (TYLENOL) 500 MG tablet Take 500-1,000 mg by mouth every 6 (six) hours as needed for mild pain or headache.   Yes [provider]  amLODipine (NORVASC) 5 MG tablet TAKE 1 TABLET BY MOUTH EVERY DAY Patient taking differently: Take 5 mg by mouth daily.  10/11/19  Yes Miquel Dunn, NP  aspirin EC 81 MG EC tablet Take 1 tablet (81 mg total) by mouth daily. 09/17/19  Yes Adrian Prows, MD  fluticasone (FLONASE) 50 MCG/ACT nasal spray Place 1-2 sprays into both nostrils daily as needed for allergies or rhinitis.   Yes [provider]  hydrALAZINE (APRESOLINE) 25 MG tablet Take 1 tablet (25 mg total) by mouth 3 (three) times daily. 09/16/19 12/15/19 Yes Adrian Prows, MD  loratadine (CLARITIN) 10 MG tablet Take 10 mg by mouth daily as needed for allergies or rhinitis.  Yes [provider]  macitentan (OPSUMIT) 10 MG tablet Take 1 tablet (10 mg total) by mouth daily. 10/04/19  Yes Miquel Dunn, NP  meloxicam (MOBIC) 7.5 MG tablet Take 7.5 mg by mouth daily as needed for pain.   Yes [provider]  metoprolol succinate (TOPROL-XL) 25 MG 24 hr tablet Take 1 tablet (25 mg total) by mouth daily. 10/28/19  Yes Miquel Dunn, NP  OXYGEN Inhale 3 L/min into the lungs continuous.   Yes [provider]  potassium chloride (KLOR-CON M10) 10 MEQ tablet Take 10 mEq by mouth 2 (two) times daily.   Yes  [provider]  sildenafil (REVATIO) 20 MG tablet TAKE 1 TABLET BY MOUTH THREE TIMES A DAY Patient taking differently: Take 20 mg by mouth 3 (three) times daily.  10/11/19  Yes Miquel Dunn, NP  spironolactone (ALDACTONE) 50 MG tablet Take 1 tablet (50 mg total) by mouth daily. 10/28/19  Yes Miquel Dunn, NP  torsemide (DEMADEX) 20 MG tablet TAKE 1 TABLET (20 MG TOTAL) BY MOUTH 2 (TWO) TIMES DAILY WITH A MEAL. 10/11/19  Yes Miquel Dunn, NP  acetaminophen (TYLENOL) 325 MG tablet Take 2 tablets (650 mg total) by mouth every 4 (four) hours as needed for headache or mild pain. Patient not taking: Reported on 10/29/2019 09/16/19   Adrian Prows, MD  budesonide (PULMICORT) 0.25 MG/2ML nebulizer solution Take 2 mLs (0.25 mg total) by nebulization 2 (two) times daily. 09/16/19   Adrian Prows, MD    Current Facility-Administered Medications  Medication Dose Route Frequency Provider Last Rate Last Dose   0.9 %  sodium chloride infusion   Intravenous PRN Adrian Prows, MD       acetaminophen (TYLENOL) tablet 650 mg  650 mg Oral Q6H PRN Adrian Prows, MD   650 mg at 11/01/19 2137   budesonide (PULMICORT) nebulizer solution 0.25 mg  0.25 mg Nebulization BID Adrian Prows, MD   0.25 mg at 11/04/19 9323   Chlorhexidine Gluconate Cloth 2 % PADS 6 each  6 each Topical Daily Adrian Prows, MD   6 each at 11/04/19 0935   diclofenac sodium (VOLTAREN) 1 % transdermal gel 2 g  2 g Topical TID PRN Adrian Prows, MD       furosemide (LASIX) 160 mg in dextrose 5 % 50 mL IVPB  160 mg Intravenous Q8H Pearson Grippe B, MD 66 mL/hr at 11/04/19 0530 160 mg at 11/04/19 0530   heparin injection 5,000 Units  5,000 Units Subcutaneous Q8H Adrian Prows, MD   5,000 Units at 11/04/19 0531   lidocaine (LIDODERM) 5 % 1 patch  1 patch Transdermal Daily PRN Adrian Prows, MD       lidocaine (XYLOCAINE) 2 % jelly 1 application  1 application Urethral Once Adrian Prows, MD       metolazone (ZAROXOLYN) tablet 10 mg  10 mg  Oral Daily Pearson Grippe B, MD   10 mg at 11/04/19 1031   polyethylene glycol (MIRALAX / GLYCOLAX) packet 17 g  17 g Oral Daily PRN Adrian Prows, MD       sildenafil (REVATIO) tablet 20 mg  20 mg Oral TID Adrian Prows, MD   20 mg at 11/04/19 1032   sodium chloride flush (NS) 0.9 % injection 10-40 mL  10-40 mL Intracatheter Q12H Adrian Prows, MD   10 mL at 11/04/19 1036   sodium chloride flush (NS) 0.9 % injection 10-40 mL  10-40 mL Intracatheter PRN Adrian Prows, MD  sodium chloride flush (NS) 0.9 % injection 3 mL  3 mL Intravenous Q12H Adrian Prows, MD   3 mL at 11/04/19 1035    Allergies as of 10/29/2019   (No Known Allergies)    Family History  Problem Relation Age of Onset   Hypertension Mother    Heart disease Mother    Hyperlipidemia Mother    Heart disease Father    Hypertension Father    Heart disease Maternal Grandmother    Heart disease Maternal Grandfather    Cancer Sister    Stroke Brother    Hypertension Brother    Colon cancer Neg Hx    Colon polyps Neg Hx    Esophageal cancer Neg Hx    Stomach cancer Neg Hx    Rectal cancer Neg Hx     Social History   Socioeconomic History   Marital status: Single    Spouse name: Not on file   Number of children: 0   Years of education: Not on file   Highest education level: Not on file  Occupational History   Occupation: Building control surveyor: [employer] resource strain: Not on file   Food insecurity    Worry: Not on file    Inability: Not on file   Transportation needs    Medical: Not on file    Non-medical: Not on file  Tobacco Use   Smoking status: Former Smoker    Packs/day: 0.25    Types: Cigarettes    Quit date: 08/2019    Years since quitting: 0.1   Smokeless tobacco: Never Used   Tobacco comment: been smoking since age 48  Substance and Sexual Activity   Alcohol use: Yes    Comment: rarely   Drug use: No    Comment: 2005   Sexual activity:  Never    Birth control/protection: None  Lifestyle   Physical activity    Days per week: Not on file    Minutes per session: Not on file   Stress: Not on file  Relationships   Social connections    Talks on phone: Not on file    Gets together: Not on file    Attends religious service: Not on file    Active member of club or organization: Not on file    Attends meetings of clubs or organizations: Not on file    Relationship status: Not on file   Intimate partner violence    Fear of current or ex partner: Not on file    Emotionally abused: Not on file    Physically abused: Not on file    Forced sexual activity: Not on file  Other Topics Concern   Not on file  Social History Narrative   Single. Education: [employer].    Review of Systems: ROS is O/W negative except as mentioned in HPI.  Physical Exam: Vital signs in last 24 hours: Temp:  [98 F (36.7 C)-98.9 F (37.2 C)] 98 F (36.7 C) (11/12 1400) Pulse Rate:  [86-93] 86 (11/12 0800) Resp:  [12-20] 18 (11/12 1400) BP: (106-123)/(60-68) 108/68 (11/12 1400) SpO2:  [96 %-100 %] 97 % (11/12 0857) Weight:  [91.2 kg] 91.2 kg (11/12 0614) Last BM Date: 11/03/19 General:  Alert, appears to have muscle wasting, pleasant and cooperative in NAD; chronically ill-appearing. Head:  Normocephalic and atraumatic. Eyes:  Sclera clear, no icterus.  Conjunctiva pink. Ears:  Normal auditory acuity. Mouth:  No deformity or lesions.  Lungs:  Clear throughout to auscultation.  No wheezes, crackles, or rhonchi.  Heart:  Regular rate and rhythm; no murmurs, clicks, rubs, or gallops. Abdomen:  Softly distended.  BS present.  Non-tender. Msk:  Symmetrical without gross deformities. . Pulses:  Normal pulses noted. Extremities:  B/L LE's wrapped. Neurologic:  Alert and oriented x 4;  grossly normal neurologically. Skin:  Intact without significant lesions or rashes. Psych:  Alert and cooperative. Normal mood and affect.  Intake/Output  from previous day: 11/11 0701 - 11/12 0700 In: 252 [P.O.:120; IV Piggyback:132] Out: 1800 [Urine:1800] Intake/Output this shift: Total I/O In: 200 [P.O.:200] Out: 620 [Urine:620]  Lab Results: Recent Labs    11/02/19 1205 11/02/19 1207 11/02/19 1319  WBC  --   --  4.3  HGB 6.5* 6.8* 8.0*  HCT 19.0* 20.0* 23.6*  PLT  --   --  123*   BMET Recent Labs    11/03/19 0647 11/03/19 1030 11/04/19 0520  NA 139 138 140  K 4.7 4.5 4.4  CL 110 110 111  CO2 20* 20* 21*  GLUCOSE 96 142* 97  BUN 97* 96* 100*  CREATININE 3.12* 3.00* 3.00*  CALCIUM 8.5* 8.5* 8.2*   LFT Recent Labs    11/04/19 0520  PROT 5.0*  ALBUMIN 2.2*  AST 18  ALT 9  ALKPHOS 28*  BILITOT 0.5   PT/INR Recent Labs    11/03/19 1030 11/04/19 0520  LABPROT 12.9 13.6  INR 1.0 1.1   Hepatitis Panel Recent Labs    11/03/19 0916 11/03/19 0939  HEPBSAG  --  NON REACTIVE  HCVAB NON REACTIVE  --    Studies/Results: US Abdomen Complete  Result Date: 11/02/2019 CLINICAL DATA:  Ascites. EXAM: ABDOMEN ULTRASOUND COMPLETE COMPARISON:  CT dated October 29, 2019 FINDINGS: Gallbladder: There is gallbladder wall thickening with the gallbladder measuring approximately 5 mm. The sonographic Percell Miller sign is negative. No gallstones are identified. Common bile duct: Diameter: 4 mm Liver: The liver surface appears nodular consistent with cirrhosis. Portal vein is patent on color Doppler imaging with normal direction of blood flow towards the liver. IVC: No abnormality visualized. Pancreas: Visualized portion unremarkable. Spleen: Size and appearance within normal limits. Right Kidney: Length: 10.2 cm. The right kidney is echogenic. There is no hydronephrosis. Left Kidney: Length: 9 cm. There is no hydronephrosis. The kidney is echogenic. Abdominal aorta: There is no abdominal aortic aneurysm. Atherosclerotic changes are noted. Other findings: There is a large volume of abdominal ascites. There is a right-sided pleural  effusion. IMPRESSION: 1. Moderate to large volume of abdominal ascites. 2. Cirrhosis without evidence for a discrete hepatic mass. 3. Gallbladder wall thickening without evidence for cholelithiasis. Gallbladder wall thickening is nonspecific can be seen in the presence of ascites. 4. Right-sided pleural effusion. 5. Echogenic kidneys bilaterally which can be seen in patients with medical renal disease or other causes of renal insufficiency. Electronically Signed   By: Constance Holster M.D.   On: 11/02/2019 20:17   Ir Paracentesis  Result Date: 11/03/2019 INDICATION: Patient with history of chronic HF, anasarca, and recurrent ascites. Request is made for diagnostic and therapeutic paracentesis with a max of 5 L. EXAM: ULTRASOUND GUIDED DIAGNOSTIC AND THERAPEUTIC PARACENTESIS MEDICATIONS: 10 mL 1% lidocaine COMPLICATIONS: None immediate. PROCEDURE: Informed written consent was obtained from the patient after a discussion of the risks, benefits and alternatives to treatment. A timeout was performed prior to the initiation of the procedure. Initial ultrasound scanning demonstrates a large amount of ascites within the right  lower abdominal quadrant. The right lower abdomen was prepped and draped in the usual sterile fashion. 1% lidocaine was used for local anesthesia. Following this, a 19 gauge, 10-cm, Yueh catheter was introduced. An ultrasound image was saved for documentation purposes. The paracentesis was performed. The catheter was removed and a dressing was applied. The patient tolerated the procedure well without immediate post procedural complication. FINDINGS: A total of approximately 5.0 L of clear yellow fluid was removed. Samples were sent to the laboratory as requested by the clinical team. IMPRESSION: Successful ultrasound-guided paracentesis yielding 5.0 L of peritoneal fluid. Read by: Earley Abide, PA-C Electronically Signed   By: Sandi Mariscal M.D.   On: 11/03/2019 16:19   IMPRESSION:  *Newly  diagnosed cirrhosis by imaging only.  LFTs are normal and INR fairly normal at 1.1.  She does have a mild thrombocytopenia.  A lot of serologies were ordered by the internal medicine service, which are pending.  This could even be cardiac cirrhosis.  I think the fact that she has other imaging studies that have not suggested cirrhosis until now and that her synthetic function is still fairly well preserved that this is likely early or compensated cirrhosis.  I am not sure that she has significantly decompensated cirrhosis. *Ascites/fluid overload: She had paracentesis with 5 L of fluid removed on 11/8 and then 5 Liters again on 11/13.  Upon calculating the SAAG level it was 0.6.  Less than 1 indicates that this is not from portal hypertension.  Suspect that the ascites and fluid overload are more likely from heart failure and renal disease.  Diuretics and fluid management is already ongoing by the nephrologist.  Heart failure team is on as well.  Fluid studies were negative for SBP. *Acute on chronic diastolic heart failure *Pulmonary arterial hypertension on continuous oxygen *Acute kidney injury on chronic kidney disease stage II: Nephrology following and managing her diuretics.  PLAN: *Would continue fluid management per nephrology.  Once again, her ascites does not appear to be from portal hypertension.  Likely from her heart failure and renal disease. *Would not pursue any further evaluation in regards to her liver disease.  Laban Emperor. Zehr  11/04/2019, 3:54 PM  GI ATTENDING  History, laboratories, x-rays reviewed.  Patient seen and examined.  Agree with comprehensive consultation note as outlined above.  Asked to see regarding possible hepatic cirrhosis and ascites.  Impressions and recommendations as below.  IMPRESSIONS: 1.  Ascites.  SAAG less < 1.0 which would mitigate against portal hypertension (or liver disease) as the cause.  She does have a history of heart failure but recent cardiac  evaluation does not show overt heart failure.  She has had progressive proteinuria over time and I would wonder about nephrotic syndrome.  Being seen by nephrology. 2.  Liver disease.  Suggested only by ultrasound in the presence of ascites.  Sonography is often not specific or sensitive for cirrhosis or liver disease.  Having said that, the preponderance of the evidence would mitigate against CLINICALLY SIGNIFICANT (if any) liver disease.  Normal are her liver tests, prothrombin time, and platelets.  Furthermore, no splenomegaly on ultrasound.  I would not pursue further work-up of liver disease beyond was been done. 3.  Management of volume overload will be to the discretion of cardiology and nephrology based on the above discussion.  Please call for questions.  Thank you.  Docia Chuck. Geri Seminole., M.D. [facility]

## 2019-11-04 NOTE — Progress Notes (Signed)
Admit: 10/29/2019 LOS: 6  89F AoCKD 3, COPD with pulmonary hypertension and right-sided heart failure, decompensated.  Subjective:  Marland Kitchen Remains HF [place] . Had LVP 5L yesterday . Stable SCr. K 4.4 . 1.8 UOP . Weights down 6kg . Remains edematous in ascending legs  11/11 0701 - 11/12 0700 In: 252 [P.O.:120; IV Piggyback:132] Out: 1800 [Urine:1800]  Filed Weights   11/02/19 0507 11/03/19 0421 11/04/19 0614  Weight: 98.2 kg 97 kg 91.2 kg    Scheduled Meds: . budesonide  0.25 mg Nebulization BID  . Chlorhexidine Gluconate Cloth  6 each Topical Daily  . heparin  5,000 Units Subcutaneous Q8H  . lidocaine  1 application Urethral Once  . metolazone  10 mg Oral Daily  . sildenafil  20 mg Oral TID  . sodium chloride flush  10-40 mL Intracatheter Q12H  . sodium chloride flush  3 mL Intravenous Q12H   Continuous Infusions: . sodium chloride    . furosemide 160 mg (11/04/19 1500)   PRN Meds:.sodium chloride, acetaminophen, diclofenac sodium, lidocaine, polyethylene glycol, sodium chloride flush  Current Labs: reviewed    Physical Exam:  Blood pressure 108/68, pulse 86, temperature 98 F (36.7 C), temperature source Oral, resp. rate 18, height 5\' 4"  (1.626 m), weight 91.2 kg, SpO2 97 %. Chronically ill-appearing, NAD, lying flat in bed Abdomen soft, distended, ascites present Regular, no murmur or rub Clear breath sounds Foley catheter in place 3+ peripheral edema to the hips Nonfocal, CN II through XII grossly intact  A/P 1. AoCKD 3, follows with Dr. Moshe Cipro as outpatient, creatinine 1.2 08/2019.  Historically borderline nephrotic proteinuria.  As outpatient, thought that heavy use of NSAIDs were contributing.  Here is massively overloaded but appears to have renal/hepatic cause, not cardiac.  Some improvement in volume status with high-dose diuretics, cont again for another 24 hours.  Renal ultrasound medical renal disease, no acute structural issues.  Etiology driving this,  unclear.  Serologies: ANCA, GBM, ANA, dsDNA negative. HBV, HCV, HIV neg 2. Question of right heart failure/cor pulmonale,; RHC reassuring, not in right or left-sided heart failure.   3. COPD, on chronic oxygen 4. Mild hyperkalemia,resolved, stop exchange resin 5. Ascites, and cirrhosis; IMTS eval in process   Pearson Grippe MD 11/04/2019, 5:06 PM  Recent Labs  Lab 11/03/19 0647 11/03/19 1030 11/04/19 0520  NA 139 138 140  K 4.7 4.5 4.4  CL 110 110 111  CO2 20* 20* 21*  GLUCOSE 96 142* 97  BUN 97* 96* 100*  CREATININE 3.12* 3.00* 3.00*  CALCIUM 8.5* 8.5* 8.2*   Recent Labs  Lab 10/31/19 0344 11/01/19 0406  11/02/19 1205 11/02/19 1207 11/02/19 1319  WBC 4.6 4.6  --   --   --  4.3  HGB 9.2* 8.5*   < > 6.5* 6.8* 8.0*  HCT 27.7*  26.3* 25.7*   < > 19.0* 20.0* 23.6*  MCV 106.9* 106.2*  --   --   --  105.8*  PLT 160 144*  --   --   --  123*   < > = values in this interval not displayed.

## 2019-11-05 DIAGNOSIS — E8770 Fluid overload, unspecified: Secondary | ICD-10-CM | POA: Diagnosis present

## 2019-11-05 LAB — COMPREHENSIVE METABOLIC PANEL
ALT: 10 U/L (ref 0–44)
AST: 17 U/L (ref 15–41)
Albumin: 2.2 g/dL — ABNORMAL LOW (ref 3.5–5.0)
Alkaline Phosphatase: 25 U/L — ABNORMAL LOW (ref 38–126)
Anion gap: 10 (ref 5–15)
BUN: 99 mg/dL — ABNORMAL HIGH (ref 8–23)
CO2: 22 mmol/L (ref 22–32)
Calcium: 8 mg/dL — ABNORMAL LOW (ref 8.9–10.3)
Chloride: 109 mmol/L (ref 98–111)
Creatinine, Ser: 2.68 mg/dL — ABNORMAL HIGH (ref 0.44–1.00)
GFR calc Af Amer: 21 mL/min — ABNORMAL LOW (ref >60)
GFR calc non Af Amer: 18 mL/min — ABNORMAL LOW (ref >60)
Glucose, Bld: 119 mg/dL — ABNORMAL HIGH (ref 70–99)
Potassium: 3.9 mmol/L (ref 3.5–5.1)
Sodium: 141 mmol/L (ref 135–145)
Total Bilirubin: 0.3 mg/dL (ref 0.3–1.2)
Total Protein: 4.9 g/dL — ABNORMAL LOW (ref 6.5–8.1)

## 2019-11-05 LAB — IMMUNOFIXATION ELECTROPHORESIS
IgA: 100 mg/dL (ref 87–352)
IgG (Immunoglobin G), Serum: 1708 mg/dL — ABNORMAL HIGH (ref 586–1602)
IgM (Immunoglobulin M), Srm: 41 mg/dL (ref 26–217)
Total Protein ELP: 5.8 g/dL — ABNORMAL LOW (ref 6.0–8.5)

## 2019-11-05 LAB — METHYLMALONIC ACID, SERUM: Methylmalonic Acid, Quantitative: 190 nmol/L (ref 0–378)

## 2019-11-05 LAB — MITOCHONDRIAL ANTIBODIES: Mitochondrial M2 Ab, IgG: 20.5 Units — ABNORMAL HIGH (ref 0.0–20.0)

## 2019-11-05 LAB — GLUCOSE, CAPILLARY
Glucose-Capillary: 101 mg/dL — ABNORMAL HIGH (ref 70–99)
Glucose-Capillary: 90 mg/dL (ref 70–99)
Glucose-Capillary: 111 mg/dL — ABNORMAL HIGH (ref 70–99)
Glucose-Capillary: 95 mg/dL (ref 70–99)
Glucose-Capillary: 92 mg/dL (ref 70–99)

## 2019-11-05 LAB — IMMUNOGLOBULINS A/E/G/M, SERUM
IgA: 103 mg/dL (ref 87–352)
IgE (Immunoglobulin E), Serum: 6 IU/mL (ref 6–495)
IgG (Immunoglobin G), Serum: 1711 mg/dL — ABNORMAL HIGH (ref 586–1602)
IgM (Immunoglobulin M), Srm: 46 mg/dL (ref 26–217)

## 2019-11-05 LAB — CYTOLOGY - NON PAP

## 2019-11-05 LAB — PROTIME-INR
INR: 1.1 (ref 0.8–1.2)
Prothrombin Time: 13.7 seconds (ref 11.4–15.2)

## 2019-11-05 LAB — COOXEMETRY PANEL
Carboxyhemoglobin: 1.8 % — ABNORMAL HIGH (ref 0.5–1.5)
Methemoglobin: 1.2 % (ref 0.0–1.5)
O2 Saturation: 75.7 %
Total hemoglobin: 9.4 g/dL — ABNORMAL LOW (ref 12.0–16.0)

## 2019-11-05 LAB — ANTI-SMOOTH MUSCLE ANTIBODY, IGG: F-Actin IgG: 17 Units (ref 0–19)

## 2019-11-05 MED ORDER — HYDROCORTISONE 1 % EX CREA
TOPICAL_CREAM | Freq: Three times a day (TID) | CUTANEOUS | Status: DC | PRN
  Administered 2019-11-06: 03:00:00 via TOPICAL
  Filled 2019-11-05: qty 28

## 2019-11-05 MED ORDER — HYDROXYZINE HCL 25 MG PO TABS: 25.0000 mg | ORAL_TABLET | Freq: Three times a day (TID) | ORAL | Status: DC | PRN

## 2019-11-05 NOTE — Progress Notes (Signed)
Admit: 10/29/2019 LOS: 7  45F AoCKD 3, COPD with pulmonary hypertension and right-sided heart failure, decompensated.  Subjective:  Marland Kitchen Remains HF [place] . Serum creatinine slightly improved at 2.7. . 1.6 L urine output yesterday . Weights are slowly improving  11/12 0701 - 11/13 0700 In: 680 [P.O.:680] Out: 1570 [Urine:1570]  Filed Weights   11/03/19 0421 11/04/19 0614 11/05/19 0440  Weight: 97 kg 91.2 kg 90.3 kg    Scheduled Meds: . budesonide  0.25 mg Nebulization BID  . Chlorhexidine Gluconate Cloth  6 each Topical Daily  . heparin  5,000 Units Subcutaneous Q8H  . lidocaine  1 application Urethral Once  . metolazone  10 mg Oral Daily  . sildenafil  20 mg Oral TID  . sodium chloride flush  10-40 mL Intracatheter Q12H  . sodium chloride flush  3 mL Intravenous Q12H   Continuous Infusions: . sodium chloride    . furosemide 160 mg (11/05/19 0530)   PRN Meds:.sodium chloride, acetaminophen, diclofenac sodium, lidocaine, polyethylene glycol, sodium chloride flush  Current Labs: reviewed    Physical Exam:  Blood pressure 104/62, pulse 86, temperature 98.2 F (36.8 C), temperature source Oral, resp. rate 18, height 5\' 4"  (1.626 m), weight 90.3 kg, SpO2 92 %. Chronically ill-appearing, NAD, lying flat in bed Abdomen soft, distended, ascites present Regular, no murmur or rub Clear breath sounds Foley catheter in place 3+ peripheral edema to the hips Nonfocal, CN II through XII grossly intact  A/P 1. AoCKD 3, follows with Dr. Moshe Cipro as outpatient, creatinine 1.2 08/2019.  Historically borderline nephrotic proteinuria.  As outpatient, thought that heavy use of NSAIDs were contributing.  Here is massively overloaded but does not appear to have primary issue being cardiac or hepatic.  Some improvement in volume status with high-dose diuretics, cont again for another 24 hours.  Renal ultrasound medical renal disease, no acute structural issues.  Etiology driving this, unclear.   Serologies: ANCA, GBM, ANA, dsDNA negative. HBV, HCV, HIV neg, sFLC consisitent with low GFR.  At this point I think we need to make sure he does not have a primary glomerular process, membranous nephropathy would be very possible.  Will diurese through the weekend and consider renal biopsy early next week. 2. Question of right heart failure/cor pulmonale,; RHC reassuring, not in right or left-sided heart failure.   3. COPD, on chronic oxygen 4. Mild hyperkalemia,resolved, stop exchange resin 5. Ascites, and cirrhosis; IMTS eval in process; seen by hepatology, thought not to have significant cirrhotic physiology.   Pearson Grippe MD 11/05/2019, 1:18 PM  Recent Labs  Lab 11/03/19 1030 11/04/19 0520 11/05/19 0529  NA 138 140 141  K 4.5 4.4 3.9  CL 110 111 109  CO2 20* 21* 22  GLUCOSE 142* 97 119*  BUN 96* 100* 99*  CREATININE 3.00* 3.00* 2.68*  CALCIUM 8.5* 8.2* 8.0*   Recent Labs  Lab 10/31/19 0344 11/01/19 0406  11/02/19 1205 11/02/19 1207 11/02/19 1319  WBC 4.6 4.6  --   --   --  4.3  HGB 9.2* 8.5*   < > 6.5* 6.8* 8.0*  HCT 27.7*  26.3* 25.7*   < > 19.0* 20.0* 23.6*  MCV 106.9* 106.2*  --   --   --  105.8*  PLT 160 144*  --   --   --  123*   < > = values in this interval not displayed.

## 2019-11-05 NOTE — Progress Notes (Addendum)
Subjective:   Patient was seen sleeping in room. She states that she is emptying her bladder well "all day and all night". She is able to walk better and has been able to stand up to go to the bathroom. She states that she has been feeling better. No acute concerns at this time.  Objective:  Vital signs in last 24 hours: Vitals:   11/04/19 1918 11/04/19 2053 11/05/19 0100 11/05/19 0440  BP:  110/66 107/65 104/62  Pulse:  86 90 86  Resp:      Temp:  98.4 F (36.9 C)  98.2 F (36.8 C)  TempSrc:  Oral  Oral  SpO2: 97% 98% 97% 100%  Weight:    90.3 kg  Height:       Physical Exam Vitals signs and nursing note reviewed.  Constitutional:      General: She is not in acute distress.    Appearance: She is ill-appearing (chronically).  Pulmonary:     Effort: Pulmonary effort is normal. No respiratory distress.     Comments: On 6L [place]. Musculoskeletal:     Comments: LE edema improving. Unna boots in place.  Skin:    General: Skin is warm and dry.  Neurological:     Mental Status: She is alert.    Assessment/Plan:  Principal Problem:   AKI (acute kidney injury) ([place]) Active Problems:   Anemia in chronic kidney disease (CKD)   Pulmonary arterial hypertension ([place])   Palliative care encounter   Decompensated liver disease ([place])   Hypervolemia  Ms. Vickrey is a 63 year old F with significant PMH of PAH, HFpEF, COPD, HTN, and CKD who presented with dyspnea, volume overload, and new ascites - diuresis well.    Hypervolemia -  Driving etiology unclear as likely multifactorial between cardiac, liver, and renal causes - will continue to optimize diuresis, improve pt's function with PT, and coordinate SNF placement before discharge - Cr improved today 2.6 << 3.0  - IV Lasix 160mg  q8rhs  - metolazone 10mg  qd  - PT/OT consult   Abdominal ultrasound on admission showed small nodular liver with findings concerning for cirrhosis, portal vein patent on Doppler with normal direction of  blood flow. Hepatitis B and C serologies negative. History of iron-deficiency anemia and multiple IV Fe transfusions in the past Ferritin 521 and iron sat of 120%, follow-up HFE genotype.   Paracentesis on 11/11 removed 5L. Total protein 3.2, albumin 1.6, total cell count 118, and neutrophil 20%. SAAG 1.1 (serum albumin was 2.7). LDH 41. Gram stain negative, culture with gram + cocci in aerobic bottle only. Cytology with no malignant cells identified.  Pending rheumatologic evaluation with antimicrosomal Ab, mitochondrial Ab, and anti-smooth muscle. Anti LKM negative. Kappa/lambda light chain ratio elevated - though likely in the setting of CKD. Ordered IFE and quan immunoglobulins   GI consulted, appreciate their input - newly diagnosed cirrhosis by imaging only - LFTs normal, INR fairly normal, mild thrombocytopenia - thought to be cardiac cirrhosis with ascites from nephrotic syndrome and heart failure - would not pursue any further evaluation of her liver disease  Nephrology consulted, appreciate their recommendations - acute on chronic kidney injury - CKD stage IV - borderline nephrotic proteinuria - thought to be hepatorenal cause vs cardiac - renal ultrasound with medical renal disease  Cardiology consulted - [facility] on 11/10 with normal pcwp, mild elev pulm pressure, and normal CO.  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  [name], MD 11/05/2019, 6:21 AM Pager: [phone]

## 2019-11-05 NOTE — Evaluation (Signed)
Physical Therapy Evaluation Patient Details Name: Vanessa Williamson MRN: [id number] DOB: [birth year] Today's Date: 11/05/2019   History of Present Illness  63 year old F with significant PMH of PAH, HFpEF, COPD, HTN, and CKD who presented with dyspnea, volume overload, and new ascites - diuresis well.   Clinical Impression  Pt was seen for mobility with monitoring of O2 sats, noted no drops below 95% during all activity including gait on side of bed.  However, pt is getting weak and requires frequent sitting rest breaks.  Her legs are weak, and have contributed to instability at home.  Will recommend SNF placement for her unless the weakness becomes more controlled and pt is able to walk safely including on stairs.  Do not anticipate this being achieved before dc from hosp so will see her for PT to work toward a shorter stay in rehab prior to going home.     Follow Up Recommendations SNF    Equipment Recommendations  None recommended by PT    Recommendations for Other Services       Precautions / Restrictions Precautions Precautions: Fall Precaution Comments: no complaints of dizziness with standing Restrictions Weight Bearing Restrictions: No  Monitor vitals with therapy esp O2 sats     Mobility  Bed Mobility Overal bed mobility: Needs Assistance Bed Mobility: Supine to Sit;Sit to Supine     Supine to sit: Mod assist Sit to supine: Total assist   General bed mobility comments: required dense assist to lift legs to bed after standign  Transfers Overall transfer level: Needs assistance Equipment used: Rolling walker (2 wheeled) Transfers: Sit to/from Stand Sit to Stand: Mod assist         General transfer comment: min A to steady. dizziness once in standing  Ambulation/Gait Ambulation/Gait assistance: Min assist Gait Distance (Feet): 6 Feet Assistive device: Rolling walker (2 wheeled);1 person hand held assist Gait Pattern/deviations: Step-to pattern;Decreased stride  length;Wide base of support;Trunk flexed Gait velocity: reduced Gait velocity interpretation: <1.8 ft/sec, indicate of risk for recurrent falls General Gait Details: pt is not very upright to stand and step quickly fatigues and sits  Stairs            Wheelchair Mobility    Modified Rankin (Stroke Patients Only)       Balance Overall balance assessment: Needs assistance Sitting-balance support: Feet supported Sitting balance-Leahy Scale: Fair     Standing balance support: Bilateral upper extremity supported;During functional activity Standing balance-Leahy Scale: Poor                               Pertinent Vitals/Pain Pain Assessment: Faces Pain Score: 0-No pain Faces Pain Scale: No hurt Pain Location: unspecified Pain Intervention(s): Monitored during session    Home Living Family/patient expects to be discharged to:: Skilled nursing facility Living Arrangements: Alone Available Help at Discharge: Family;Friend(s);Available PRN/intermittently Type of Home: House Home Access: Stairs to enter Entrance Stairs-Rails: Right;Left Entrance Stairs-Number of Steps: 4 terraced steps Home Layout: One level Home Equipment: Walker - 2 wheels;Other (comment)(oxygen) Additional Comments: was increased to 8L O2 at admission but is on 3L at home    Prior Function Level of Independence: Independent with assistive device(s)         Comments: used RW for gait     Hand Dominance   Dominant Hand: Right    Extremity/Trunk Assessment   Upper Extremity Assessment Upper Extremity Assessment: Generalized weakness    Lower  Extremity Assessment Lower Extremity Assessment: Generalized weakness    Cervical / Trunk Assessment Cervical / Trunk Assessment: Kyphotic  Communication   Communication: No difficulties  Cognition Arousal/Alertness: Awake/alert Behavior During Therapy: WFL for tasks assessed/performed Overall Cognitive Status: Within Functional  Limits for tasks assessed                                        General Comments General comments (skin integrity, edema, etc.): pt is quickly tired out by active movement, requiring help to get back to bed from the effort.  Has minimal resources for help at home currently and will expect her to need to go to rehab setting to get home safely    Exercises     Assessment/Plan    PT Assessment Patient needs continued PT services  PT Problem List Decreased strength;Decreased range of motion;Decreased activity tolerance;Decreased balance;Decreased mobility;Decreased coordination;Cardiopulmonary status limiting activity       PT Treatment Interventions DME instruction;Gait training;Stair training;Functional mobility training;Therapeutic activities;Therapeutic exercise;Balance training;Neuromuscular re-education;Patient/family education    PT Goals (Current goals can be found in the Care Plan section)  Acute Rehab PT Goals Patient Stated Goal: to get stronger PT Goal Formulation: With patient Time For Goal Achievement: 11/19/19 Potential to Achieve Goals: Good    Frequency Min 2X/week   Barriers to discharge Inaccessible home environment;Decreased caregiver support home with stairs to enter house and assistance needed    Co-evaluation               AM-PAC PT "6 Clicks" Mobility  Outcome Measure Help needed turning from your back to your side while in a flat bed without using bedrails?: A Little Help needed moving from lying on your back to sitting on the side of a flat bed without using bedrails?: A Lot Help needed moving to and from a bed to a chair (including a wheelchair)?: A Lot Help needed standing up from a chair using your arms (e.g., wheelchair or bedside chair)?: A Lot Help needed to walk in hospital room?: A Little Help needed climbing 3-5 steps with a railing? : Total 6 Click Score: 13    End of Session Equipment Utilized During Treatment: Gait  belt;Oxygen Activity Tolerance: Patient limited by fatigue;Treatment limited secondary to medical complications (Comment) Patient left: in bed;with call bell/phone within reach;with bed alarm set Nurse Communication: Mobility status PT Visit Diagnosis: Unsteadiness on feet (R26.81);Muscle weakness (generalized) (M62.81);Adult, failure to thrive (R62.7)    Time: 1135-1210 PT Time Calculation (min) (ACUTE ONLY): 35 min   Charges:   PT Evaluation $PT Eval Moderate Complexity: 1 Mod PT Treatments $Therapeutic Activity: 8-22 mins       Ramond Dial 11/05/2019, 5:04 PM   Mee Hives, PT MS Acute Rehab Dept. Number: [place] [facility] [place]

## 2019-11-05 NOTE — Progress Notes (Signed)
Palliative:  HPI:63 y.o.femalewith past medical history of COPD, pulmonary arterial hypertension, diastolic heart failure, and chronic kidney disease stage IIIwho was admitted on 11/6/2020with complaints of back and sacral pain,multiple falls at home,hypoxic respiratory failure secondary to diastolic heart failure. Creatinine was found to be elevated at 3.12 from a baseline of 1.18withconcerns for cardiorenal syndrome. On 11/8 she underwent paracentesis and 5 L of ascites was removed.There are concerns for liver disease. She has had decreased urine output and continued concern with fluid overload. Now diuresing with slow renal improvement. Overall prognosis poor but she is beginning to improve.    I met again today with [facility]. She is in good spirits again today and is feeling better. Renal function is improving and continues with good urine output. Edema is also improving. She continues to talk of wanting to ride her motorcycle. I explained that we need to get her out of bed and start with walking first! She agrees and even negotiates that she can just ride with someone on the motorcycle (which has 3 wheels). She talks of the peace and joy it brings her to feel the wind on her face when on her motorcycle.   She plans to speak with her friends more and would like more time to consider her secondary HCPOA. She is tearful thinking of how close to dying she thought she was a few days ago and she is very relieved to be improving now. She continues to be aware that she continues with poor health and is accepting that she will not live very long but is happy that we feel prognosis is not as poor as we thought a few days ago.   All questions/concerns addressed. Emotional support provided.   Exam: Alert, oriented. No distress. HR regular. Breathing regular, unlabored and less SOB with activity. Abd much smaller, soft. BLE edema much improved.   Plan: - Recommend weaning oxygen to maintain sats 94% or  above.  - Consider SNF rehab with outpatient palliative to follow.  - Plan to complete HCPOA and notarize when she is ready to complete.   [place], NP Palliative Medicine Team Pager [phone] (Please see [email or web address] for schedule) Team Phone [phone]    Greater than 50%  of this time was spent counseling and coordinating care related to the above assessment and plan

## 2019-11-05 NOTE — Progress Notes (Deleted)
OT Cancellation Note  Patient Details Name: Vanessa Williamson MRN: [id number] DOB: 1956/10/23   Cancelled Treatment:    Reason Eval/Treat Not Completed: OT screened, no needs identified, will sign off. Pt observed independently walking in the hall, dressed and awaiting d/c home. Spoke with pt briefly and she feels she is at/back to baseline with ADLs.   Tyrone Schimke, OT Acute Rehabilitation Services Pager: [phone] Office: [phone]  11/05/2019, 1:44 PM

## 2019-11-05 NOTE — Progress Notes (Signed)
CRITICAL VALUE ALERT  Critical Value:  Peritoneal Washings AEROBIC BOTTLE ONLY GRAM POSITIVE COCCI   Date & Time Notied:  11/04/2019 2301  Provider Notified: Dr. Sheppard Coil   Orders Received/Actions taken: No new orders given at this time.

## 2019-11-05 NOTE — Evaluation (Signed)
Occupational Therapy Evaluation Patient Details Name: Vanessa Williamson MRN: [id number] DOB: 16-Feb-1956 Today's Date: 11/05/2019    History of Present Illness 63 year old F with significant PMH of PAH, HFpEF, COPD, HTN, and CKD who presented with dyspnea, volume overload, and new ascites - diuresis well.    Clinical Impression   Pt admitted with the above diagnoses and presents with below problem list. Pt will benefit from continued acute OT to address the below listed deficits and maximize independence with basic ADLs. At baseline pt is independent with ADLs. Pt is currently min to mod A with LB ADLs, min A with functional transfers. Pt received sitting EOB eating lunch. Pt limited by onset of dizziness in standing position. Pt needing to sit back down. BP then assessed and found to be 84/59. Pt declined supine position and left sitting EOB. Pt reported she was feeling better than when she was standing but dizziness not completely resolved yet. Nursing notified.      Follow Up Recommendations  SNF    Equipment Recommendations  Other (comment)(defer to next venue)    Recommendations for Other Services       Precautions / Restrictions Precautions Precautions: Fall Precaution Comments: suspect orthostatic during OT eval Restrictions Weight Bearing Restrictions: No      Mobility Bed Mobility Overal bed mobility: Needs Assistance             General bed mobility comments: sitting EOB at start and end of session  Transfers Overall transfer level: Needs assistance Equipment used: Rolling walker (2 wheeled) Transfers: Sit to/from Stand Sit to Stand: Min assist         General transfer comment: min A to steady. dizziness once in standing    Balance Overall balance assessment: Needs assistance         Standing balance support: Bilateral upper extremity supported Standing balance-Leahy Scale: Poor                             ADL either performed or assessed  with clinical judgement   ADL Overall ADL's : Needs assistance/impaired Eating/Feeding: Set up;Sitting   Grooming: Set up;Sitting   Upper Body Bathing: Set up;Sitting   Lower Body Bathing: Moderate assistance;Sit to/from stand   Upper Body Dressing : Set up;Sitting   Lower Body Dressing: Moderate assistance;Sit to/from stand                 General ADL Comments: Pt compeleted side stepping along EOB. Recevied sitting EOB eating lunch     Vision         Perception     Praxis      Pertinent Vitals/Pain Pain Assessment: Faces Faces Pain Scale: Hurts a little bit Pain Location: unspecified Pain Intervention(s): Monitored during session     Hand Dominance     Extremity/Trunk Assessment Upper Extremity Assessment Upper Extremity Assessment: Overall WFL for tasks assessed;Generalized weakness   Lower Extremity Assessment Lower Extremity Assessment: Defer to PT evaluation       Communication Communication Communication: No difficulties   Cognition Arousal/Alertness: Awake/alert Behavior During Therapy: WFL for tasks assessed/performed Overall Cognitive Status: Within Functional Limits for tasks assessed                                     General Comments       Exercises  Shoulder Instructions      Home Living Family/patient expects to be discharged to:: Skilled nursing facility Living Arrangements: Alone                                      Prior Functioning/Environment Level of Independence: Independent                 OT Problem List: Impaired balance (sitting and/or standing);Decreased activity tolerance;Decreased knowledge of use of DME or AE;Decreased knowledge of precautions;Pain      OT Treatment/Interventions: Self-care/ADL training;DME and/or AE instruction;Therapeutic activities;Patient/family education;Balance training    OT Goals(Current goals can be found in the care plan section) Acute  Rehab OT Goals Patient Stated Goal: regain independence, move better OT Goal Formulation: With patient Time For Goal Achievement: 11/19/19 Potential to Achieve Goals: Good ADL Goals Pt Will Perform Lower Body Bathing: with modified independence;sit to/from stand Pt Will Perform Lower Body Dressing: with modified independence;sit to/from stand Pt Will Transfer to Toilet: with modified independence;ambulating Pt Will Perform Toileting - Clothing Manipulation and hygiene: with modified independence;sit to/from stand Pt Will Perform Tub/Shower Transfer: with modified independence;ambulating;rolling walker;shower seat  OT Frequency: Min 2X/week   Barriers to D/C:            Co-evaluation              AM-PAC OT "6 Clicks" Daily Activity     Outcome Measure Help from another person eating meals?: None Help from another person taking care of personal grooming?: None Help from another person toileting, which includes using toliet, bedpan, or urinal?: A Little Help from another person bathing (including washing, rinsing, drying)?: A Little Help from another person to put on and taking off regular upper body clothing?: A Little Help from another person to put on and taking off regular lower body clothing?: A Little 6 Click Score: 20   End of Session Equipment Utilized During Treatment: Rolling walker;Oxygen(5L hi-flow) Nurse Communication: Mobility status;Other (comment)(dizzy, low bp, pt declined supine, sitting EOB)  Activity Tolerance: Other (comment)(dizzy in standing, bp assessed in sitting and low) Patient left: in bed;with call bell/phone within reach;Other (comment)(sitting EOB, pt declined supine)  OT Visit Diagnosis: Unsteadiness on feet (R26.81);Pain;Muscle weakness (generalized) (M62.81)                Time: 1350-1405 OT Time Calculation (min): 15 min Charges:  OT General Charges $OT Visit: 1 Visit OT Evaluation $OT Eval Low Complexity: [place],  OT Acute Rehabilitation Services Pager: [phone] Office: [phone]  Hortencia Pilar 11/05/2019, 2:21 PM

## 2019-11-06 LAB — COMPREHENSIVE METABOLIC PANEL
ALT: 9 U/L (ref 0–44)
AST: 13 U/L — ABNORMAL LOW (ref 15–41)
Albumin: 2.2 g/dL — ABNORMAL LOW (ref 3.5–5.0)
Alkaline Phosphatase: 24 U/L — ABNORMAL LOW (ref 38–126)
Anion gap: 9 (ref 5–15)
BUN: 100 mg/dL — ABNORMAL HIGH (ref 8–23)
CO2: 23 mmol/L (ref 22–32)
Calcium: 8 mg/dL — ABNORMAL LOW (ref 8.9–10.3)
Chloride: 107 mmol/L (ref 98–111)
Creatinine, Ser: 2.52 mg/dL — ABNORMAL HIGH (ref 0.44–1.00)
GFR calc Af Amer: 23 mL/min — ABNORMAL LOW (ref >60)
GFR calc non Af Amer: 19 mL/min — ABNORMAL LOW (ref >60)
Glucose, Bld: 97 mg/dL (ref 70–99)
Potassium: 3.9 mmol/L (ref 3.5–5.1)
Sodium: 139 mmol/L (ref 135–145)
Total Bilirubin: 0.8 mg/dL (ref 0.3–1.2)
Total Protein: 5 g/dL — ABNORMAL LOW (ref 6.5–8.1)

## 2019-11-06 LAB — COOXEMETRY PANEL
Carboxyhemoglobin: 2 % — ABNORMAL HIGH (ref 0.5–1.5)
Methemoglobin: 1.2 % (ref 0.0–1.5)
O2 Saturation: 76.6 %
Total hemoglobin: 9.4 g/dL — ABNORMAL LOW (ref 12.0–16.0)

## 2019-11-06 LAB — PROTIME-INR
INR: 1.1 (ref 0.8–1.2)
Prothrombin Time: 13.6 seconds (ref 11.4–15.2)

## 2019-11-06 LAB — CULTURE, BODY FLUID W GRAM STAIN -BOTTLE

## 2019-11-06 LAB — PROTEIN / CREATININE RATIO, URINE
Creatinine, Urine: 72.66 mg/dL
Total Protein, Urine: <6 mg/dL

## 2019-11-06 LAB — GLUCOSE, CAPILLARY: Glucose-Capillary: 114 mg/dL — ABNORMAL HIGH (ref 70–99)

## 2019-11-06 MED ORDER — HYDROXYZINE HCL 10 MG PO TABS
10.0000 mg | ORAL_TABLET | Freq: Two times a day (BID) | ORAL | Status: DC | PRN
  Administered 2019-11-06: 10 mg via ORAL
  Filled 2019-11-06: qty 1

## 2019-11-06 NOTE — TOC Initial Note (Signed)
Transition of Care [facility]) - Initial/Assessment Note    Patient Details  Name: Vanessa Williamson MRN: [id number] Date of Birth: 1956/11/13  [facility] CM/SW Contact:    Vanessa Castilla, LCSW Phone Number: [phone] 11/06/2019, 11:46 AM  Clinical Narrative:                  CSW met with patient to discuss PT recommendation of a SNF. Patient was aware of recommendation and in agreement with going to a ST SNF. CSW discussed the SNF process.CSW provided patient with medicare.gov rating list.  Patient gave CSW permission to fax referrals out to local facilities.CSW answered questions about the SNF process and the next  steps.   CSW will continue to follow for discharge planning needs.   Expected Discharge Plan: Skilled Nursing Facility Barriers to Discharge: Continued Medical Work up, Ship broker, SNF Pending bed offer   Patient Goals and CMS Choice Patient states their goals for this hospitalization and ongoing recovery are:: To be able to take care of myself and get stronger CMS Medicare.gov Compare Post Acute Care list provided to:: Patient Choice offered to / list presented to : Patient  Expected Discharge Plan and Services Expected Discharge Plan: [place]       Living arrangements for the past 2 months: Single Family Home                                      Prior Living Arrangements/Services Living arrangements for the past 2 months: Single Family Home Lives with:: Self Patient language and need for interpreter reviewed:: Yes Do you feel safe going back to the place where you live?: Yes               Activities of Daily Living Home Assistive Devices/Equipment: Gilford Rile (specify type) ADL Screening (condition at time of admission) Patient's cognitive ability adequate to safely complete daily activities?: Yes Is the patient deaf or have difficulty hearing?: No Does the patient have difficulty seeing, even when wearing  glasses/contacts?: No Does the patient have difficulty concentrating, remembering, or making decisions?: No Patient able to express need for assistance with ADLs?: Yes Does the patient have difficulty dressing or bathing?: Yes Independently performs ADLs?: Yes (appropriate for developmental age) Does the patient have difficulty walking or climbing stairs?: Yes Weakness of Legs: Both Weakness of Arms/Hands: None  Permission Sought/Granted   Permission granted to share information with : Yes, Verbal Permission Granted  Share Information with NAME: Vanessa Williamson  Permission granted to share info w AGENCY: SNFs  Permission granted to share info w Relationship: friend  Permission granted to share info w Contact Information: [phone]  Emotional Assessment Appearance:: Appears stated age Attitude/Demeanor/Rapport: Engaged Affect (typically observed): Calm, Appropriate Orientation: : Oriented to Self, Oriented to Place, Oriented to  Time, Oriented to Situation   Psych Involvement: No (comment)  Admission diagnosis:  Hyperkalemia [E87.5] AKI (acute kidney injury) ([place]) [N17.9] Acute on chronic congestive heart failure, unspecified heart failure type [facility]) [I50.9] Patient Active Problem List   Diagnosis Date Noted  . Hypervolemia   . Decompensated liver disease ([place]) 11/02/2019  . AKI (acute kidney injury) ([place])   . Palliative care encounter   . Pulmonary arterial hypertension ([place]) 10/30/2019  . (HFpEF) heart failure with preserved ejection fraction ([place]) 09/10/2019  . Anemia in chronic kidney disease (CKD) 07/20/2019  . Incarcerated ventral hernia 10/16/2017  .  Ventral hernia 02/03/2014  . Unspecified vitamin D deficiency 12/11/2013  . Morbid obesity with BMI of 40.0-44.9, adult ([place]) 12/10/2013  . Tobacco use disorder 12/10/2013  . COPD (chronic obstructive pulmonary disease) ([place]) 08/19/2012  . HTN (hypertension) 03/08/2012  . GERD (gastroesophageal reflux disease) 03/08/2012  .  Allergic rhinitis 03/08/2012   PCP:  Patient, No Pcp Per Pharmacy:   [id number]: [phone]: [phone]    Social Determinants of Health (SDOH) Interventions    Readmission Risk Interventions No flowsheet data found.

## 2019-11-06 NOTE — NC FL2 (Signed)
[place] LEVEL OF CARE SCREENING TOOL     IDENTIFICATION  Patient Name: Vanessa Williamson Birthdate: January 21, 1956 Sex: female Admission Date (Current Location): 10/29/2019  [facility] and [place] Number:  Herbalist and Address:  [facility], [place] [address], [place], [place] [address]      Provider Number: [id number]  Attending Physician Name and Address:  Axel Filler, *  Relative Name and Phone Number:  RFFMBWG [phone]    Current Level of Care: Hospital Recommended Level of Care: [place] Prior Approval Number:    Date Approved/Denied:   PASRR Number: [id number]  Discharge Plan: SNF    Current Diagnoses: Patient Active Problem List   Diagnosis Date Noted  . Hypervolemia   . Decompensated liver disease ([place]) 11/02/2019  . AKI (acute kidney injury) ([place])   . Palliative care encounter   . Pulmonary arterial hypertension ([place]) 10/30/2019  . (HFpEF) heart failure with preserved ejection fraction ([place]) 09/10/2019  . Anemia in chronic kidney disease (CKD) 07/20/2019  . Incarcerated ventral hernia 10/16/2017  . Ventral hernia 02/03/2014  . Unspecified vitamin D deficiency 12/11/2013  . Morbid obesity with BMI of 40.0-44.9, adult ([place]) 12/10/2013  . Tobacco use disorder 12/10/2013  . COPD (chronic obstructive pulmonary disease) ([place]) 08/19/2012  . HTN (hypertension) 03/08/2012  . GERD (gastroesophageal reflux disease) 03/08/2012  . Allergic rhinitis 03/08/2012    Orientation RESPIRATION BLADDER Height & Weight     Self, Time, Situation, Place  O2(4L) Continent, External catheter Weight: 199 lb 4.7 oz (90.4 kg) Height:  5\' 4"  (162.6 cm)  BEHAVIORAL SYMPTOMS/MOOD NEUROLOGICAL BOWEL NUTRITION STATUS      Continent Diet(Carb Modified)  AMBULATORY STATUS COMMUNICATION OF NEEDS Skin   Limited Assist Verbally Skin abrasions                       Personal Care Assistance Level of Assistance   Bathing, Feeding, Dressing, Total care Bathing Assistance: Limited assistance Feeding assistance: Independent Dressing Assistance: Limited assistance Total Care Assistance: Limited assistance   Functional Limitations Info  Sight, Hearing, Speech Sight Info: Adequate Hearing Info: Adequate Speech Info: Adequate    SPECIAL CARE FACTORS FREQUENCY  PT (By licensed PT), OT (By licensed OT)     PT Frequency: 5x per week OT Frequency: 5x per week            Contractures Contractures Info: Not present    Additional Factors Info  Code Status, Allergies Code Status Info: DNR Allergies Info: NKA           Current Medications (11/06/2019):  This is the current hospital active medication list Current Facility-Administered Medications  Medication Dose Route Frequency Provider Last Rate Last Dose  . 0.9 %  sodium chloride infusion   Intravenous PRN Adrian Prows, MD      . acetaminophen (TYLENOL) tablet 650 mg  650 mg Oral Q6H PRN Adrian Prows, MD   650 mg at 11/05/19 0045  . budesonide (PULMICORT) nebulizer solution 0.25 mg  0.25 mg Nebulization BID Adrian Prows, MD   0.25 mg at 11/06/19 0826  . Chlorhexidine Gluconate Cloth 2 % PADS 6 each  6 each Topical Daily Adrian Prows, MD   6 each at 11/05/19 [phone]  . diclofenac sodium (VOLTAREN) 1 % transdermal gel 2 g  2 g Topical TID PRN Adrian Prows, MD      . furosemide (LASIX) 160 mg in dextrose 5 % 50  mL IVPB  160 mg Intravenous Q8H Pearson Grippe B, MD 66 mL/hr at 11/06/19 0522 160 mg at 11/06/19 0522  . heparin injection 5,000 Units  5,000 Units Subcutaneous Q8H Adrian Prows, MD   5,000 Units at 11/06/19 0519  . hydrocortisone cream 1 %   Topical TID PRN Earlene Plater, MD      . lidocaine (LIDODERM) 5 % 1 patch  1 patch Transdermal Daily PRN Adrian Prows, MD      . lidocaine (XYLOCAINE) 2 % jelly 1 application  1 application Urethral Once Adrian Prows, MD      . metolazone (ZAROXOLYN) tablet 10 mg  10 mg Oral Daily Pearson Grippe B, MD   10 mg at  11/06/19 0825  . polyethylene glycol (MIRALAX / GLYCOLAX) packet 17 g  17 g Oral Daily PRN Adrian Prows, MD      . sildenafil (REVATIO) tablet 20 mg  20 mg Oral TID Adrian Prows, MD   20 mg at 11/06/19 0827  . sodium chloride flush (NS) 0.9 % injection 10-40 mL  10-40 mL Intracatheter Q12H Adrian Prows, MD   10 mL at 11/06/19 0827  . sodium chloride flush (NS) 0.9 % injection 10-40 mL  10-40 mL Intracatheter PRN Adrian Prows, MD      . sodium chloride flush (NS) 0.9 % injection 3 mL  3 mL Intravenous Q12H Adrian Prows, MD   3 mL at 11/06/19 7416     Discharge Medications: Please see discharge summary for a list of discharge medications.  Relevant Imaging Results:  Relevant Lab Results:   Additional Information SS# [place], [place]

## 2019-11-06 NOTE — Progress Notes (Signed)
Admit: 10/29/2019 LOS: 11  75F AoCKD 3, COPD with pulmonary hypertension and right-sided heart failure, decompensated.  Subjective:  Marland Kitchen Remains HF [place] . Further improved SCr . 1L UOP yesterday . Stable weights . Edema much improved  11/13 0701 - 11/14 0700 In: 132 [IV Piggyback:132] Out: 1000 [Urine:1000]  Filed Weights   11/04/19 0614 11/05/19 0440 11/06/19 0523  Weight: 91.2 kg 90.3 kg 90.4 kg    Scheduled Meds: . budesonide  0.25 mg Nebulization BID  . Chlorhexidine Gluconate Cloth  6 each Topical Daily  . heparin  5,000 Units Subcutaneous Q8H  . lidocaine  1 application Urethral Once  . metolazone  10 mg Oral Daily  . sildenafil  20 mg Oral TID  . sodium chloride flush  10-40 mL Intracatheter Q12H  . sodium chloride flush  3 mL Intravenous Q12H   Continuous Infusions: . sodium chloride    . furosemide 160 mg (11/06/19 0522)   PRN Meds:.sodium chloride, acetaminophen, diclofenac sodium, hydrocortisone cream, lidocaine, polyethylene glycol, sodium chloride flush  Current Labs: reviewed    Physical Exam:  Blood pressure 112/66, pulse 85, temperature 97.7 F (36.5 C), temperature source Oral, resp. rate (!) 21, height 5\' 4"  (1.626 m), weight 90.4 kg, SpO2 99 %. Chronically ill-appearing, NAD, lying flat in bed Abdomen soft, distended, ascites present Regular, no murmur or rub Clear breath sounds Foley catheter in place 3+ peripheral edema to the hips Nonfocal, CN II through XII grossly intact  A/P 1. AoCKD 3, follows with Dr. Moshe Cipro as outpatient, creatinine 1.2 08/2019.  Historically borderline nephrotic proteinuria.  As outpatient, thought that heavy use of NSAIDs were contributing.  Here is massively overloaded but does not appear to have primary issue being cardiac or hepatic, has diuresed effectively. Renal ultrasound medical renal disease, no acute structural issues.  Etiology driving this, unclear.  Serologies: ANCA, GBM, ANA, dsDNA negative. HBV, HCV, HIV  neg, sFLC consisitent with low GFR.  At this point I think we need to make sure he does not have a primary glomerular process, membranous nephropathy would be very possible.  Will diurese through the weekend and consider renal biopsy early next week.  Repeat UA and UP/C.   2. Question of right heart failure/cor pulmonale,; RHC reassuring, not in right or left-sided heart failure.  On sildenafil.  3. COPD, on chronic oxygen 4. Mild hyperkalemia,resolved, stop exchange resin 5. Ascites, and cirrhosis;    Pearson Grippe MD 11/06/2019, 10:41 AM  Recent Labs  Lab 11/04/19 0520 11/05/19 0529 11/06/19 0500  NA 140 141 139  K 4.4 3.9 3.9  CL 111 109 107  CO2 21* 22 23  GLUCOSE 97 119* 97  BUN 100* 99* 100*  CREATININE 3.00* 2.68* 2.52*  CALCIUM 8.2* 8.0* 8.0*   Recent Labs  Lab 10/31/19 0344 11/01/19 0406  11/02/19 1205 11/02/19 1207 11/02/19 1319  WBC 4.6 4.6  --   --   --  4.3  HGB 9.2* 8.5*   < > 6.5* 6.8* 8.0*  HCT 27.7*  26.3* 25.7*   < > 19.0* 20.0* 23.6*  MCV 106.9* 106.2*  --   --   --  105.8*  PLT 160 144*  --   --   --  123*   < > = values in this interval not displayed.

## 2019-11-06 NOTE — Progress Notes (Signed)
Subjective: Pt seen at the bedside. Feeling okay, was able to stand with PT yesterday and use the bedside commode. Decreasing oxygen requirement. No acute concerns.  Objective:  Vital signs in last 24 hours: Vitals:   11/05/19 2041 11/05/19 2353 11/06/19 0200 11/06/19 0523  BP: (!) 101/59 103/71  112/66  Pulse: 83 87 84 85  Resp:  15  (!) 21  Temp: 98.4 F (36.9 C) 97.7 F (36.5 C)  97.7 F (36.5 C)  TempSrc: Oral Oral  Oral  SpO2: 99% 99% 100% 99%  Weight:    90.4 kg  Height:       Physical Exam Vitals signs and nursing note reviewed.  Constitutional:      General: She is not in acute distress.    Appearance: She is normal weight. She is ill-appearing (chronically]).  Abdominal:     General: There is distension.     Palpations: Abdomen is soft.     Tenderness: There is no abdominal tenderness.  Musculoskeletal:     Right lower leg: Edema present.     Left lower leg: Edema present.     Comments: Unna boots in place.   Neurological:     Mental Status: She is alert.    Assessment/Plan:  Principal Problem:   AKI (acute kidney injury) ([place]) Active Problems:   Anemia in chronic kidney disease (CKD)   Pulmonary arterial hypertension ([place])   Palliative care encounter   Decompensated liver disease ([place])   Hypervolemia  Vanessa Williamson is a 63 yearold Fwith significant PMH of PAH,HFpEF,COPD, HTN,and CKDwho presented withdyspnea,volume overload,and new ascites.   Hypervolemia- continue to optimize diuresis Driving etiology unclear as likely multifactorial between cardiac, liver, and renal causes -  - Cr improving 2.52 << 2.6 << 3.0 - urine output -1L with net balance -868cc - continue IV Lasix 160mg  q8rhs  - metolazone 10mg  qd - PT/OT consult - recommending SNF - consult to social work placed  Nephrology consulted, appreciate their recommendations - acute on chronic kidney injury -CKD stage IV - borderline nephrotic proteinuria and renal ultrasound with  medical renal disease - diurese through the weekend and consider renal biopsy early next week - repeat UA and UPC  Kappa/lambda light chain ratio elevated - though ?in the setting of CKD. IFE with polyclonal increase in one or more immunoglobulins. Quan immunoglobulins with elevated IgG to 1,711.  Abdominal US with small nodular liver with findings concerning for cirrhosis, portal vein patent on Doppler.HBV and HCV serologies negative. History of iron-deficiency anemia and multiple IV Fe transfusions in the past Ferritin 521 and iron sat of 120%, follow-up HFEgenotype. Pending rheumatologic evaluation with antimicrosomalAb, mitochondrialAb, and anti-smooth muscle. Anti LKM negative.  Paracentesison 11/8 and 11/11 with removal of5L each time. Total protein 3.2, albumin 1.6, total cell count 118, and neutrophil 20%. LDH 41. Gram stain negative, culture with gram + cocci in aerobic bottle only. Cytology with no malignant cells identified.   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  Vanessa Horns, MD 11/06/2019, 6:37 AM Pager: [phone]

## 2019-11-07 DIAGNOSIS — Z79899 Other long term (current) drug therapy: Secondary | ICD-10-CM

## 2019-11-07 DIAGNOSIS — I5030 Unspecified diastolic (congestive) heart failure: Secondary | ICD-10-CM

## 2019-11-07 DIAGNOSIS — N184 Chronic kidney disease, stage 4 (severe): Secondary | ICD-10-CM

## 2019-11-07 DIAGNOSIS — Z66 Do not resuscitate: Secondary | ICD-10-CM

## 2019-11-07 DIAGNOSIS — K7460 Unspecified cirrhosis of liver: Secondary | ICD-10-CM

## 2019-11-07 DIAGNOSIS — I130 Hypertensive heart and chronic kidney disease with heart failure and stage 1 through stage 4 chronic kidney disease, or unspecified chronic kidney disease: Principal | ICD-10-CM

## 2019-11-07 DIAGNOSIS — R188 Other ascites: Secondary | ICD-10-CM

## 2019-11-07 DIAGNOSIS — J449 Chronic obstructive pulmonary disease, unspecified: Secondary | ICD-10-CM

## 2019-11-07 LAB — URINALYSIS, ROUTINE W REFLEX MICROSCOPIC
Bilirubin Urine: NEGATIVE
Glucose, UA: NEGATIVE mg/dL
Hgb urine dipstick: NEGATIVE
Ketones, ur: NEGATIVE mg/dL
Leukocytes,Ua: NEGATIVE
Nitrite: NEGATIVE
Protein, ur: NEGATIVE mg/dL
Specific Gravity, Urine: 1.009 (ref 1.005–1.030)
pH: 5 (ref 5.0–8.0)

## 2019-11-07 LAB — CBC
HCT: 25.7 % — ABNORMAL LOW (ref 36.0–46.0)
Hemoglobin: 8.8 g/dL — ABNORMAL LOW (ref 12.0–15.0)
MCH: 34.8 pg — ABNORMAL HIGH (ref 26.0–34.0)
MCHC: 34.2 g/dL (ref 30.0–36.0)
MCV: 101.6 fL — ABNORMAL HIGH (ref 80.0–100.0)
Platelets: 156 10*3/uL (ref 150–400)
RBC: 2.53 MIL/uL — ABNORMAL LOW (ref 3.87–5.11)
RDW: 18.4 % — ABNORMAL HIGH (ref 11.5–15.5)
WBC: 3 10*3/uL — ABNORMAL LOW (ref 4.0–10.5)
nRBC: 0 % (ref 0.0–0.2)

## 2019-11-07 LAB — GLUCOSE, CAPILLARY
Glucose-Capillary: 100 mg/dL — ABNORMAL HIGH (ref 70–99)
Glucose-Capillary: 106 mg/dL — ABNORMAL HIGH (ref 70–99)

## 2019-11-07 LAB — COMPREHENSIVE METABOLIC PANEL
ALT: 9 U/L (ref 0–44)
AST: 14 U/L — ABNORMAL LOW (ref 15–41)
Albumin: 2.4 g/dL — ABNORMAL LOW (ref 3.5–5.0)
Alkaline Phosphatase: 26 U/L — ABNORMAL LOW (ref 38–126)
Anion gap: 10 (ref 5–15)
BUN: 99 mg/dL — ABNORMAL HIGH (ref 8–23)
CO2: 22 mmol/L (ref 22–32)
Calcium: 8.1 mg/dL — ABNORMAL LOW (ref 8.9–10.3)
Chloride: 106 mmol/L (ref 98–111)
Creatinine, Ser: 2.59 mg/dL — ABNORMAL HIGH (ref 0.44–1.00)
GFR calc Af Amer: 22 mL/min — ABNORMAL LOW (ref >60)
GFR calc non Af Amer: 19 mL/min — ABNORMAL LOW (ref >60)
Glucose, Bld: 101 mg/dL — ABNORMAL HIGH (ref 70–99)
Potassium: 3.7 mmol/L (ref 3.5–5.1)
Sodium: 138 mmol/L (ref 135–145)
Total Bilirubin: 0.4 mg/dL (ref 0.3–1.2)
Total Protein: 5.4 g/dL — ABNORMAL LOW (ref 6.5–8.1)

## 2019-11-07 LAB — COOXEMETRY PANEL
Carboxyhemoglobin: 2.1 % — ABNORMAL HIGH (ref 0.5–1.5)
Methemoglobin: 1.3 % (ref 0.0–1.5)
O2 Saturation: 72.3 %
Total hemoglobin: 9.9 g/dL — ABNORMAL LOW (ref 12.0–16.0)

## 2019-11-07 LAB — PROTIME-INR
INR: 0.9 (ref 0.8–1.2)
Prothrombin Time: 12.4 seconds (ref 11.4–15.2)

## 2019-11-07 MED ORDER — TORSEMIDE 20 MG PO TABS
100.0000 mg | ORAL_TABLET | Freq: Every day | ORAL | Status: DC
  Administered 2019-11-07 – 2019-11-11 (×5): 100 mg via ORAL
  Filled 2019-11-07 (×5): qty 5

## 2019-11-07 MED ORDER — METOLAZONE 5 MG PO TABS
5.0000 mg | ORAL_TABLET | Freq: Every day | ORAL | Status: DC
  Administered 2019-11-08 – 2019-11-09 (×2): 5 mg via ORAL
  Filled 2019-11-07 (×2): qty 1

## 2019-11-07 NOTE — Progress Notes (Signed)
Subjective: Pt seen at the bedside this morning. Doing well, on 4L [place]. She is still finding it difficult to get comfortably in the bed. Was able to stand yesterday and use the bedside commode. Seen by social work who has sent out SNF referrals. No acute concerns.  Objective:  Vital signs in last 24 hours: Vitals:   11/06/19 0828 11/06/19 1812 11/06/19 2049 11/07/19 0405  BP:  97/65 101/67 99/68  Pulse:  85 86 83  Resp:   19 20  Temp:  98.4 F (36.9 C) 98.2 F (36.8 C) 98 F (36.7 C)  TempSrc:  Oral Oral Oral  SpO2: 99% 97% 95% 95%  Weight:    89.9 kg  Height:       Physical Exam Vitals signs and nursing note reviewed.  Constitutional:      General: She is not in acute distress.    Appearance: She is ill-appearing (chronically).  Abdominal:     General: Bowel sounds are normal. There is distension.     Palpations: Abdomen is soft.  Musculoskeletal:     Comments: Bilaterally Unna boots in place. LE edema improving.  Neurological:     Mental Status: She is alert.    Assessment/Plan:  Principal Problem:   AKI (acute kidney injury) ([place]) Active Problems:   Anemia in chronic kidney disease (CKD)   Pulmonary arterial hypertension ([place])   Palliative care encounter   Decompensated liver disease ([place])   Hypervolemia  Ms. Horkey is a 63 yearold Fwith significant PMH of PAH,HFpEF,COPD, HTN,and CKDwho presented withdyspnea,volume overload,and new ascites.   Hypervolemia- continue to optimize diuresis Driving etiology unclear as likely multifactorial between cardiac, liver, and renal causes -  - Cr stable 2.59 << 2.52 << 2.6 << 3.0 - urine output -400cc with net balance -268cc - continue to work with PT/OT - recommending SNF - social work faxed out referrals to [facility] 11/14  Nephrology consulted, appreciate their recommendations - acute on chronic kidney injury -CKD stage IV - switch IV lasix to PO torsemide 100mg  PO daily - decrease metolazone to 5mg  daily  -history of borderline nephrotic proteinuria but UPC ratio here below reportable range  - renal ultrasound with medical renal disease - no indication for renal biopsy with improving GFR and no proteinuria - repeat UA  Kappa/lambda light chain ratio elevated - though ?in the setting of CKD.IFEwith polyclonal increase in one or more immunoglobulins. Quan immunoglobulins with elevated IgG to 1,711.  Abdominal US with small nodular liver with findings concerning for cirrhosis, portal vein patent on Doppler.HBV and HCV serologies negative.Rheumatologic evaluation of antimicrosomalAb,anti-smooth muscle, and anti LKMall negative.History of iron-deficiency anemia and multiple IV Fe transfusions in the past Ferritin 521 and iron sat of 120%,follow-up HFEgenotype.   Paracentesison 11/8 and 11/11 with removal of5L each time.Total protein 3.2, albumin 1.6, total cell count 118, and neutrophil 20%. LDH 41. Gram stainnegative, culturewith gram + cocci in aerobic bottle only. Cytologywith no malignant cells identified.   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

## 2019-11-07 NOTE — Progress Notes (Signed)
Una boots removed as ordered

## 2019-11-07 NOTE — Progress Notes (Signed)
Admit: 10/29/2019 LOS: 38  2F AoCKD 3, COPD with pulmonary hypertension and right-sided heart failure, decompensated.  Subjective:  . [place] O2 req . Stable SC4 . UOP not well quantified . Edema much improved . UP/C = 0, surprising,  11/14 0701 - 11/15 0700 In: 132 [IV Piggyback:132] Out: 400 [Urine:400]  Filed Weights   11/05/19 0440 11/06/19 0523 11/07/19 0405  Weight: 90.3 kg 90.4 kg 89.9 kg    Scheduled Meds: . budesonide  0.25 mg Nebulization BID  . Chlorhexidine Gluconate Cloth  6 each Topical Daily  . heparin  5,000 Units Subcutaneous Q8H  . lidocaine  1 application Urethral Once  . metolazone  10 mg Oral Daily  . sildenafil  20 mg Oral TID  . sodium chloride flush  10-40 mL Intracatheter Q12H  . sodium chloride flush  3 mL Intravenous Q12H   Continuous Infusions: . sodium chloride    . furosemide 160 mg (11/07/19 0610)   PRN Meds:.sodium chloride, acetaminophen, diclofenac sodium, hydrocortisone cream, hydrOXYzine, lidocaine, polyethylene glycol, sodium chloride flush  Current Labs: reviewed    Physical Exam:  Blood pressure 131/66, pulse 84, temperature 98 F (36.7 C), temperature source Oral, resp. rate 20, height 5\' 4"  (1.626 m), weight 89.9 kg, SpO2 96 %. Chronically ill-appearing, NAD, lying flat in bed Abdomen soft, distended, ascites present Regular, no murmur or rub Clear breath sounds Foley catheter in place 3+ peripheral edema to the hips Nonfocal, CN II through XII grossly intact  A/P 1. AoCKD 3, follows with Dr. Moshe Cipro as outpatient, creatinine 1.2 08/2019.  Historically borderline nephrotic proteinuria but not present here.  Here was massively overloaded but does not appear to have primary issue being cardiac or hepatic, has diuresed effectively. Renal ultrasound medical renal disease, no acute structural issues.  Etiology driving this, unclear.  Serologies: ANCA, GBM, ANA, dsDNA negative. HBV, HCV, HIV neg, sFLC consisitent with low GFR.  In absence of proteinuria and improving GFR no indication for renal biopsy.  Will attempt to transtion to PO diuretics and see how she does.  Still need UA   2. Question of right heart failure/cor pulmonale,; RHC reassuring, not in right or left-sided heart failure.  On sildenafil.  3. COPD, on chronic oxygen 4. Mild hyperkalemia,resolved, stop exchange resin 5. Ascites, and cirrhosis;    Pearson Grippe MD 11/07/2019, 11:24 AM  Recent Labs  Lab 11/05/19 0529 11/06/19 0500 11/07/19 0428  NA 141 139 138  K 3.9 3.9 3.7  CL 109 107 106  CO2 22 23 22   GLUCOSE 119* 97 101*  BUN 99* 100* 99*  CREATININE 2.68* 2.52* 2.59*  CALCIUM 8.0* 8.0* 8.1*   Recent Labs  Lab 11/01/19 0406  11/02/19 1207 11/02/19 1319 11/07/19 0705  WBC 4.6  --   --  4.3 3.0*  HGB 8.5*   < > 6.8* 8.0* 8.8*  HCT 25.7*   < > 20.0* 23.6* 25.7*  MCV 106.2*  --   --  105.8* 101.6*  PLT 144*  --   --  123* 156   < > = values in this interval not displayed.

## 2019-11-08 DIAGNOSIS — N189 Chronic kidney disease, unspecified: Secondary | ICD-10-CM

## 2019-11-08 LAB — COMPREHENSIVE METABOLIC PANEL
ALT: 11 U/L (ref 0–44)
AST: 16 U/L (ref 15–41)
Albumin: 2.4 g/dL — ABNORMAL LOW (ref 3.5–5.0)
Alkaline Phosphatase: 26 U/L — ABNORMAL LOW (ref 38–126)
Anion gap: 11 (ref 5–15)
BUN: 103 mg/dL — ABNORMAL HIGH (ref 8–23)
CO2: 23 mmol/L (ref 22–32)
Calcium: 8.1 mg/dL — ABNORMAL LOW (ref 8.9–10.3)
Chloride: 105 mmol/L (ref 98–111)
Creatinine, Ser: 2.76 mg/dL — ABNORMAL HIGH (ref 0.44–1.00)
GFR calc Af Amer: 20 mL/min — ABNORMAL LOW (ref >60)
GFR calc non Af Amer: 17 mL/min — ABNORMAL LOW (ref >60)
Glucose, Bld: 97 mg/dL (ref 70–99)
Potassium: 3.8 mmol/L (ref 3.5–5.1)
Sodium: 139 mmol/L (ref 135–145)
Total Bilirubin: 0.4 mg/dL (ref 0.3–1.2)
Total Protein: 5.2 g/dL — ABNORMAL LOW (ref 6.5–8.1)

## 2019-11-08 LAB — COOXEMETRY PANEL
Carboxyhemoglobin: 1.9 % — ABNORMAL HIGH (ref 0.5–1.5)
Methemoglobin: 1.1 % (ref 0.0–1.5)
O2 Saturation: 57.6 %
Total hemoglobin: 10.2 g/dL — ABNORMAL LOW (ref 12.0–16.0)

## 2019-11-08 LAB — CBC
HCT: 27.9 % — ABNORMAL LOW (ref 36.0–46.0)
Hemoglobin: 9.4 g/dL — ABNORMAL LOW (ref 12.0–15.0)
MCH: 34.7 pg — ABNORMAL HIGH (ref 26.0–34.0)
MCHC: 33.7 g/dL (ref 30.0–36.0)
MCV: 103 fL — ABNORMAL HIGH (ref 80.0–100.0)
Platelets: 175 10*3/uL (ref 150–400)
RBC: 2.71 MIL/uL — ABNORMAL LOW (ref 3.87–5.11)
RDW: 18.6 % — ABNORMAL HIGH (ref 11.5–15.5)
WBC: 3.4 10*3/uL — ABNORMAL LOW (ref 4.0–10.5)
nRBC: 0 % (ref 0.0–0.2)

## 2019-11-08 LAB — GLUCOSE, CAPILLARY
Glucose-Capillary: 153 mg/dL — ABNORMAL HIGH (ref 70–99)
Glucose-Capillary: 97 mg/dL (ref 70–99)
Glucose-Capillary: 96 mg/dL (ref 70–99)
Glucose-Capillary: 105 mg/dL — ABNORMAL HIGH (ref 70–99)
Glucose-Capillary: 109 mg/dL — ABNORMAL HIGH (ref 70–99)
Glucose-Capillary: 109 mg/dL — ABNORMAL HIGH (ref 70–99)

## 2019-11-08 LAB — SARS CORONAVIRUS 2 (TAT 6-24 HRS): SARS Coronavirus 2: NEGATIVE

## 2019-11-08 LAB — PROTIME-INR
INR: 1 (ref 0.8–1.2)
Prothrombin Time: 12.6 seconds (ref 11.4–15.2)

## 2019-11-08 NOTE — Plan of Care (Signed)
  Problem: Education: Goal: Knowledge of General Education information will improve Description: Including pain rating scale, medication(s)/side effects and non-pharmacologic comfort measures Outcome: Progressing   Problem: Nutrition: Goal: Adequate nutrition will be maintained Outcome: Progressing   Problem: Elimination: Goal: Will not experience complications related to bowel motility Outcome: Progressing Goal: Will not experience complications related to urinary retention Outcome: Progressing   Problem: Pain Managment: Goal: General experience of comfort will improve Outcome: Progressing   Problem: Safety: Goal: Ability to remain free from injury will improve Outcome: Progressing

## 2019-11-08 NOTE — Plan of Care (Signed)
  Problem: Pain Managment: Goal: General experience of comfort will improve Outcome: Progressing   

## 2019-11-08 NOTE — Progress Notes (Signed)
Please add "Palliative Care to follow at SNF" in the discharge summary.  Thank you.  PMT will continue to chart check.  Please call our office if more active intervention is needed while inpatient.  Florentina Jenny, PA-C Palliative Medicine Office:  [phone]  No charge note

## 2019-11-08 NOTE — TOC Progression Note (Signed)
Transition of Care [facility]) - Progression Note    Patient Details  Name: Vanessa Williamson MRN: [id number] Date of Birth: December 30, 1955  Transition of [facility] CM/SW Contact  Graves-Bigelow, Ocie Cornfield, RN Phone Number: 11/08/2019, 10:45 AM  Clinical Narrative:  CM spoke with patient regarding SNF bed offers. Patient has chosen [facility] called [facility] to make her aware- she will obtain authorization. CM did call [employer]; however she has a Regulatory affairs officer and the SNF will obtain authorization. CM did ask Staff RN to go forward with COVID-screen. Awaiting call back from [facility] with authorization approval. Lexine Baton is aware that patient will need palliative services. CM will continue to follow for transition of care needs.       Expected Discharge Plan: [place] Barriers to Discharge: Continued Medical Work up, Ship broker, SNF Pending bed offer  Expected Discharge Plan and Services Expected Discharge Plan: [place] arrangements for the past 2 months: Single Family Home                   Social Determinants of Health (SDOH) Interventions    Readmission Risk Interventions No flowsheet data found.

## 2019-11-08 NOTE — Progress Notes (Signed)
Admit: 10/29/2019 LOS: 42  31F AoCKD 3, COPD with pulmonary hypertension and right-sided heart failure, decompensated.  Subjective:  . Cr overall improved.  Hopeful for d/c soon.  11/15 0701 - 11/16 0700 In: -  Out: 300 [Urine:300]  Filed Weights   11/06/19 0523 11/07/19 0405 11/08/19 0547  Weight: 90.4 kg 89.9 kg 85.5 kg    Scheduled Meds: . budesonide  0.25 mg Nebulization BID  . Chlorhexidine Gluconate Cloth  6 each Topical Daily  . heparin  5,000 Units Subcutaneous Q8H  . lidocaine  1 application Urethral Once  . metolazone  5 mg Oral Daily  . sildenafil  20 mg Oral TID  . sodium chloride flush  10-40 mL Intracatheter Q12H  . sodium chloride flush  3 mL Intravenous Q12H  . torsemide  100 mg Oral Daily   Continuous Infusions: . sodium chloride     PRN Meds:.sodium chloride, acetaminophen, diclofenac sodium, hydrocortisone cream, hydrOXYzine, lidocaine, polyethylene glycol, sodium chloride flush  Current Labs: reviewed    Physical Exam:  Blood pressure 117/75, pulse 89, temperature (!) 97.5 F (36.4 C), temperature source Oral, resp. rate 20, height 5\' 4"  (1.626 m), weight 85.5 kg, SpO2 95 %. GEN Chronically ill-appearing, NAD, sitting on BSC RRR Regular, no murmur or rub PULM Clear breath sounds EXT: 2+ peripheral edema to the hips Nonfocal, CN II through XII grossly intact  A/P 1. AoCKD 3, follows with Dr. Moshe Cipro as outpatient, creatinine 1.2 08/2019.  Historically borderline nephrotic proteinuria but not present here.  Here was massively overloaded but does not appear to have primary issue being cardiac or hepatic, has diuresed effectively. Renal ultrasound medical renal disease, no acute structural issues.  Etiology driving this, unclear.  Serologies: ANCA, GBM, ANA, dsDNA negative. HBV, HCV, HIV neg, sFLC consisitent with low GFR. In absence of proteinuria and improving GFR no indication for renal biopsy.  On PO diuretics.  UA clear   2. Question of right  heart failure/cor pulmonale,; RHC reassuring, not in right or left-sided heart failure.  On sildenafil.  3. COPD, on chronic oxygen 4. Mild hyperkalemia,resolved, stopped exchange resin 5. Ascites, and cirrhosis;    Madelon Lips MD 11/08/2019, 2:47 PM  Recent Labs  Lab 11/06/19 0500 11/07/19 0428 11/08/19 0501  NA 139 138 139  K 3.9 3.7 3.8  CL 107 106 105  CO2 23 22 23   GLUCOSE 97 101* 97  BUN 100* 99* 103*  CREATININE 2.52* 2.59* 2.76*  CALCIUM 8.0* 8.1* 8.1*   Recent Labs  Lab 11/02/19 1319 11/07/19 0705 11/08/19 0501  WBC 4.3 3.0* 3.4*  HGB 8.0* 8.8* 9.4*  HCT 23.6* 25.7* 27.9*  MCV 105.8* 101.6* 103.0*  PLT 123* 156 175

## 2019-11-08 NOTE — Progress Notes (Signed)
Occupational Therapy Treatment Patient Details Name: Vanessa Williamson MRN: [id number] DOB: 07-29-1956 Today's Date: 11/08/2019    History of present illness 63 year old F with significant PMH of PAH, HFpEF, COPD, HTN, and CKD who presented with dyspnea, volume overload, and new ascites - diuresis well.    OT comments  Pt still needing min assist overall or greater for bed mobility, toilet transfers, and short distance mobility.  Mod assist is needed for LB selfcare at this time secondary to her decreased ability to reach down to her feet.  Endurance and overall strength continues to be a factor as well.  Recommend continued acute care OT to progress to greater ADL independence.  Anticipate need for extended rehab at SNF level in order to achieve modified independent level.    Follow Up Recommendations  SNF    Equipment Recommendations  Other (comment)(defer to SNF but pt reports needing a 3:1 as she has low toilets at home)       Precautions / Restrictions Precautions Precautions: Fall Restrictions Weight Bearing Restrictions: No       Mobility Bed Mobility   Bed Mobility: Supine to Sit;Sit to Supine     Supine to sit: Min assist;HOB elevated Sit to supine: Mod assist   General bed mobility comments: Pt needed mod assist for lifting her LEs into the bed when laying down.  Transfers Overall transfer level: Needs assistance Equipment used: Rolling walker (2 wheeled) Transfers: Sit to/from [facility] Sit to Stand: Min assist Stand pivot transfers: Min assist       General transfer comment: Mod instructional cueing for squaring herself up to the surface when sitting and for reaching back to the bed and controlling decent instead of flopping.    Balance Overall balance assessment: Needs assistance Sitting-balance support: Feet supported Sitting balance-Leahy Scale: Fair     Standing balance support: Bilateral upper extremity supported;During functional  activity Standing balance-Leahy Scale: Poor Standing balance comment: Pt needs UE support as well as therapist min assist level to complete dynamic standing.                           ADL either performed or assessed with clinical judgement   ADL Overall ADL's : Needs assistance/impaired     Grooming: Brushing hair;Sitting;Set up               Lower Body Dressing: Total assistance Lower Body Dressing Details (indicate cue type and reason): sitting EOB for gripper socks only Toilet Transfer: Minimal assistance;Ambulation;RW Toilet Transfer Details (indicate cue type and reason): simulated, pt declined need to toilet         Functional mobility during ADLs: Minimal assistance General ADL Comments: Pt ambulated to the door of the room and back with overall min assist using the RW for support.  Increased dyspnea noted but O2 sats remained 92% or better on 3Ls nasal cannula.  She reported some increased anxiety as she has not ambulated in a while and feels unstable. She exhibits decreased ability to reach her feet for bathing tasks or donning socks.  She states at home she wouldn't wear them since she was not able to get them on.  Will benefit from AE trial.               Cognition Arousal/Alertness: Awake/alert Behavior During Therapy: WFL for tasks assessed/performed Overall Cognitive Status: Within Functional Limits for tasks assessed  Pertinent Vitals/ Pain       Pain Assessment: Faces Pain Score: 0-No pain     Prior Functioning/Environment              Frequency  Min 2X/week        Progress Toward Goals  OT Goals(current goals can now be found in the care plan section)  Progress towards OT goals: Progressing toward goals     Plan Discharge plan remains appropriate       AM-PAC OT "6 Clicks" Daily Activity     Outcome Measure   Help from another person eating  meals?: None Help from another person taking care of personal grooming?: None Help from another person toileting, which includes using toliet, bedpan, or urinal?: A Little Help from another person bathing (including washing, rinsing, drying)?: A Little Help from another person to put on and taking off regular upper body clothing?: A Little Help from another person to put on and taking off regular lower body clothing?: A Lot 6 Click Score: 19    End of Session Equipment Utilized During Treatment: Rolling walker;Oxygen  OT Visit Diagnosis: Unsteadiness on feet (R26.81);Repeated falls (R29.6)   Activity Tolerance Patient tolerated treatment well   Patient Left in bed;with call bell/phone within reach   Nurse Communication Mobility status        Time: 7564-3329 OT Time Calculation (min): 40 min  Charges: OT General Charges $OT Visit: 1 Visit OT Treatments $Self Care/Home Management : 8-22 mins $Therapeutic Activity: 23-37 mins   Anitha Kreiser OTR/L 11/08/2019, 12:35 PM

## 2019-11-08 NOTE — TOC Progression Note (Signed)
Transition of Care [facility]) - Progression Note    Patient Details  Name: Vanessa Williamson MRN: [id number] Date of Birth: 05-Jun-1956  [facility]) CM/SW Contact  Graves-Bigelow, Ocie Cornfield, RN Phone Number: 11/08/2019, 3:41 PM  Clinical Narrative:   CM received information from CSW to provide bed offers. Patient chose [place] spoke with [facility] for [facility] Noon the patient's insurance does not have a SNF benefit. Patient unable to pay out of pocket. Friends were on the phone working with the patient regarding alternative options. Patient agreeable to allow CM to look into [facility] accepted the patient; however, she will have a co pay for $130.00 for each discipline until she meets her deductible then the co pay will decrease to $25.00 per each discipline. This co pay is expensive for the patient. Patient may only be able to have [facility] PT with the co pay Patient has no-one that can assist her in the home. She states she will work with her friends to come up with a plan of care. Patient is not safe to transition home alone- per patient "I just hav to be strong enough to get up when I fall"-Hopefully PT can work with her enough to get the recommendations to be HH. CM will continue to follow for transition of care needs.     Expected Discharge Plan: [place] Barriers to Discharge: Continued Medical Work up  Expected Discharge Plan and Services Expected Discharge Plan: [place] In-house Referral: NA Discharge Planning Services: CM Consult Post Acute Care Choice: [place] arrangements for the past 2 months: [place]: PT [place]: [employer] ([place]) Date [place]: 11/08/19 Time [place]: 1212 Representative spoke with at [place]: [facility]Valerie   Readmission Risk Interventions No flowsheet data found.

## 2019-11-08 NOTE — Progress Notes (Signed)
   Subjective: Pt seen at the bedside this morning. Feels her breathing is unchanged, remains on 3L [place]. Unna boots removed yesterday. No acute concerns.   Objective:  Vital signs in last 24 hours: Vitals:   11/07/19 1400 11/07/19 1926 11/07/19 1948 11/08/19 0547  BP: 134/71 107/62  122/63  Pulse:  86  91  Resp: 18 19  20   Temp: 98.1 F (36.7 C) 97.7 F (36.5 C)  97.8 F (36.6 C)  TempSrc: Oral Oral  Oral  SpO2:  94% 98% 99%  Weight:    85.5 kg  Height:       Physical Exam Vitals signs and nursing note reviewed.  Constitutional:      General: She is not in acute distress.    Appearance: She is ill-appearing (chronically).  Cardiovascular:     Rate and Rhythm: Normal rate and regular rhythm.  Pulmonary:     Breath sounds: Normal breath sounds.  Abdominal:     General: There is distension.     Palpations: Abdomen is soft. There is fluid wave.     Tenderness: There is no abdominal tenderness.  Musculoskeletal:     Comments: Bilateral LE pitting edema L > R, improved from admission  Neurological:     Mental Status: She is alert.    Assessment/Plan:  Principal Problem:   AKI (acute kidney injury) ([place]) Active Problems:   Anemia in chronic kidney disease (CKD)   Acute diastolic congestive heart failure ([place])   Pulmonary arterial hypertension ([place])   Palliative care encounter   Decompensated liver disease ([place])   Hypervolemia   Abdominal ascites  Ms. Tallent is a 63 yearold Fwith significant PMH of PAH,HFpEF,COPD, HTN,and CKDwho presented withdyspnea and new ascites consistent with fluid overload from multi-organ failure.   Hypervolemia- much improved Driving etiology unclear as likely multifactorial between cardiac, liver, and renal causes -  - Cr 2.76 today (stable now between 2.5-2.7) - urine output -300cc yesterday - appear inaccurate (no intake recorded) - continue to work with PT/OT- recommending SNF - social work faxed out referrals to [facility] 11/14 -  palliative care to follow at SNF  - Normal UA and insignificant urine protein/creatine ratio - NOT indicative of nephrotic process  Though pt's lab work reassuring with no elevation in AST, ALT, or INR, she still has radiologic findings of cirrhosis and reaccumulation of her new ascites after two paracenteses. Pt may benefit from additional investigation into cirrhosis with elastography.    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

## 2019-11-09 LAB — CBC WITH DIFFERENTIAL/PLATELET
Abs Immature Granulocytes: 0.01 10*3/uL (ref 0.00–0.07)
Basophils Absolute: 0 10*3/uL (ref 0.0–0.1)
Basophils Relative: 1 %
Eosinophils Absolute: 0.1 10*3/uL (ref 0.0–0.5)
Eosinophils Relative: 2 %
HCT: 28 % — ABNORMAL LOW (ref 36.0–46.0)
Hemoglobin: 9.3 g/dL — ABNORMAL LOW (ref 12.0–15.0)
Immature Granulocytes: 0 %
Lymphocytes Relative: 23 %
Lymphs Abs: 0.9 10*3/uL (ref 0.7–4.0)
MCH: 34.6 pg — ABNORMAL HIGH (ref 26.0–34.0)
MCHC: 33.2 g/dL (ref 30.0–36.0)
MCV: 104.1 fL — ABNORMAL HIGH (ref 80.0–100.0)
Monocytes Absolute: 0.3 10*3/uL (ref 0.1–1.0)
Monocytes Relative: 7 %
Neutro Abs: 2.7 10*3/uL (ref 1.7–7.7)
Neutrophils Relative %: 67 %
Platelets: 199 10*3/uL (ref 150–400)
RBC: 2.69 MIL/uL — ABNORMAL LOW (ref 3.87–5.11)
RDW: 18.7 % — ABNORMAL HIGH (ref 11.5–15.5)
WBC: 3.9 10*3/uL — ABNORMAL LOW (ref 4.0–10.5)
nRBC: 0 % (ref 0.0–0.2)

## 2019-11-09 LAB — COMPREHENSIVE METABOLIC PANEL
ALT: 11 U/L (ref 0–44)
AST: 17 U/L (ref 15–41)
Albumin: 2.5 g/dL — ABNORMAL LOW (ref 3.5–5.0)
Alkaline Phosphatase: 28 U/L — ABNORMAL LOW (ref 38–126)
Anion gap: 10 (ref 5–15)
BUN: 106 mg/dL — ABNORMAL HIGH (ref 8–23)
CO2: 23 mmol/L (ref 22–32)
Calcium: 8.1 mg/dL — ABNORMAL LOW (ref 8.9–10.3)
Chloride: 103 mmol/L (ref 98–111)
Creatinine, Ser: 2.73 mg/dL — ABNORMAL HIGH (ref 0.44–1.00)
GFR calc Af Amer: 20 mL/min — ABNORMAL LOW (ref >60)
GFR calc non Af Amer: 18 mL/min — ABNORMAL LOW (ref >60)
Glucose, Bld: 105 mg/dL — ABNORMAL HIGH (ref 70–99)
Potassium: 3.6 mmol/L (ref 3.5–5.1)
Sodium: 136 mmol/L (ref 135–145)
Total Bilirubin: 0.5 mg/dL (ref 0.3–1.2)
Total Protein: 5.5 g/dL — ABNORMAL LOW (ref 6.5–8.1)

## 2019-11-09 LAB — PROTIME-INR
INR: 1 (ref 0.8–1.2)
Prothrombin Time: 13 seconds (ref 11.4–15.2)

## 2019-11-09 LAB — GLUCOSE, CAPILLARY
Glucose-Capillary: 124 mg/dL — ABNORMAL HIGH (ref 70–99)
Glucose-Capillary: 100 mg/dL — ABNORMAL HIGH (ref 70–99)
Glucose-Capillary: 106 mg/dL — ABNORMAL HIGH (ref 70–99)
Glucose-Capillary: 112 mg/dL — ABNORMAL HIGH (ref 70–99)
Glucose-Capillary: 130 mg/dL — ABNORMAL HIGH (ref 70–99)
Glucose-Capillary: 104 mg/dL — ABNORMAL HIGH (ref 70–99)
Glucose-Capillary: 104 mg/dL — ABNORMAL HIGH (ref 70–99)
Glucose-Capillary: 123 mg/dL — ABNORMAL HIGH (ref 70–99)

## 2019-11-09 LAB — HEMOCHROMATOSIS DNA-PCR(C282Y,H63D)

## 2019-11-09 NOTE — Plan of Care (Signed)
  Problem: Elimination: Goal: Will not experience complications related to urinary retention Outcome: Progressing  Pt using [place] and voiding without difficulty

## 2019-11-09 NOTE — Progress Notes (Signed)
  [facility] Progress Note    Assessment/ Plan:   1. AoCKD 3, follows with Dr. Moshe Cipro as outpatient, creatinine 1.2 08/2019.  Historically borderline nephrotic proteinuria but not present here.  Here was massively overloaded but does not appear to have primary issue being cardiac or hepatic, has diuresed effectively. Renal ultrasound medical renal disease, no acute structural issues.  Etiology driving this, unclear.  Serologies: ANCA, GBM, ANA, dsDNA negative. HBV, HCV, HIV neg, sFLC consisitent with low GFR. In absence of proteinuria and improving GFR no indication for renal biopsy.  On PO diuretics.  UA clear.  Large volume ascites- have stopped metolazone today to see if renal function could improve enough to switch to Lasix/ aldactone combination--> unclear if eGFR will become adequate to do so.  Labs pending for today.   2. Question of right heart failure/cor pulmonale,; RHC reassuring, not in right or left-sided heart failure.  On sildenafil.  3. COPD, on chronic oxygen 4. Mild hyperkalemia,resolved, stopped exchange resin 5. Ascites, and cirrhosis- GI reconsulted today, appreciate their recommendations.   6. Dispo: pending  Subjective:    Labs pending for today.  Feeling OK but has some soreness of her backside.     Objective:   BP 123/73 (BP Location: Left Arm)   Pulse 85   Temp 97.8 F (36.6 C) (Oral)   Resp 20   Ht _0  (1.626 m)   Wt 86.7 kg   SpO2 98%   BMI 32.80 kg/m   Intake/Output Summary (Last 24 hours) at 11/09/2019 1340 Last data filed at 11/09/2019 0525 Gross per 24 hour  Intake 120 ml  Output 900 ml  Net -780 ml   Weight change:   Physical Exam: Gen: sitting in chair, looks uncomfortable CVS: RRR no m/r/g Resp: clear bilaterally no c/w/r, on O2 Abd: soft, nondistended, NABS--> + ascites with fluid wave Ext: 3+ LLE edema, 1+ RLE edema  Imaging: No results found.  Labs: BMET Recent Labs  Lab 11/03/19 0647 11/03/19 1030  11/04/19 0520 11/05/19 0529 11/06/19 0500 11/07/19 0428 11/08/19 0501  NA 139 138 140 141 139 138 139  K 4.7 4.5 4.4 3.9 3.9 3.7 3.8  CL 110 110 111 109 107 106 105  CO2 20* 20* 21* _1 GLUCOSE 96 142* 97 119* 97 101* 97  BUN 97* 96* 100* 99* 100* 99* 103*  CREATININE 3.12* 3.00* 3.00* 2.68* 2.52* 2.59* 2.76*  CALCIUM 8.5* 8.5* 8.2* 8.0* 8.0* 8.1* 8.1*   CBC Recent Labs  Lab 11/07/19 0705 11/08/19 0501 11/09/19 1300  WBC 3.0* 3.4* 3.9*  NEUTROABS  --   --  2.7  HGB 8.8* 9.4* 9.3*  HCT 25.7* 27.9* 28.0*  MCV 101.6* 103.0* 104.1*  PLT 156 175 199    Medications:    . budesonide  0.25 mg Nebulization BID  . Chlorhexidine Gluconate Cloth  6 each Topical Daily  . heparin  5,000 Units Subcutaneous Q8H  . lidocaine  1 application Urethral Once  . sildenafil  20 mg Oral TID  . sodium chloride flush  10-40 mL Intracatheter Q12H  . sodium chloride flush  3 mL Intravenous Q12H  . torsemide  100 mg Oral Daily      Madelon Lips, MD 11/09/2019, 1:40 PM

## 2019-11-09 NOTE — Progress Notes (Addendum)
Daily Rounding Note  11/09/2019, 12:53 PM  LOS: 11 days   SUBJECTIVE:   Chief complaint: Ascites. Please see the original GI consult by Dr. Scarlette Shorts dated 11/04/2019.  He was asked to see the patient for ascites.   11/02/2019 abdominal ultrasound showed moderate to large abdominal ascites, cirrhosis, nonspecific GB wall thickening.  Right pleural effusion. Underwent 5 L paracentesis 11/8, 5 L paracentesis 11/13.  Cytology with reactive mesothelial cells, no malignant cells.  SAAG initially calculated as 0.6, Dr. Henrene Pastor felt the ascites was due to possible nephrotic syndrome versus volume overload from diastolic heart failure.  He was not convinced the patient truly had cirrhosis. "  Sonography is often not specific or sensitive for cirrhosis or liver disease".   Subsequent to this patient, renal and cardiac followed the patient.  She did not rule in for nephrotic syndrome but was massively volume overloaded. 2D echocardiogram 11/7 showed LVEF 55 to 60%.  Low normal right ventricular systolic function with moderate enlargement.  Suggestion of elevated right-sided pressures.  11/02/2019 right heart catheterization with findings consistent with moderate pulmonary hypertension.  Thus not confirming diagnosis of acute cor pulmonale or acute decompensated heart failure.  He suggested considering dialysis to reduce fluid and discontinue dobutamine and milrinone.   Medical team is now back to wondering about management of her ascites and if it may indeed be consequent to liver disease.  Her renal fx remains compromised, rising creatinine.  Albumin 2.2 >> 2.4 Anemic w macrocytosis.  Hgb 8.8 >> 9.4.  No iron, B12 or folate deficiencies.   No coagulopathy, no thrombocytopenia.  On calculation of SAAG from 11/11. Fluid albumin 1.6, blood albumin 2.7 c/w SAAG of 1.1 which is c/w portal hypertension.   Pt has no previous hx liver dz.  Drank ETOH  in colllege but no heavy drinking after that.   Liver CT normal in 2018 Since her paras, the abdominal fluid has reaccumulated.  She receives Torsemide 100 mg/day.  No other diuretics.  If asked to lay on right side, her right abdomen is sore, but no pain in any other position.     OBJECTIVE:         Vital signs in last 24 hours:    Temp:  [97.5 F (36.4 C)-97.8 F (36.6 C)] 97.8 F (36.6 C) (11/17 0506) Pulse Rate:  [85-90] 85 (11/17 0506) BP: (109-123)/(70-75) 123/73 (11/17 0506) SpO2:  [94 %-98 %] 98 % (11/17 0943) Weight:  [86.7 kg] 86.7 kg (11/17 0705) Last BM Date: 11/09/19 Filed Weights   11/07/19 0405 11/08/19 0547 11/09/19 0705  Weight: 89.9 kg 85.5 kg 86.7 kg   General: pleasant, chronically ill looking   Heart: RRR Chest: clear.  Some dyspnea with speaking.  No cough Abdomen: distended but soft w obvious ascites. NT.   Active BS Extremities: anasarca, edema up both legs to thighs, >> on left Neuro/Psych:  Pleasant.  No confusion.  No tremors.  Fluid speech.  Good historian.  Intake/Output from previous day: 11/16 0701 - 11/17 0700 In: 120 [P.O.:120] Out: 900 [Urine:900]  Intake/Output this shift: No intake/output data recorded.  Lab Results: Recent Labs    11/07/19 0705 11/08/19 0501  WBC 3.0* 3.4*  HGB 8.8* 9.4*  HCT 25.7* 27.9*  PLT 156 175   BMET Recent Labs    11/07/19 0428 11/08/19 0501  NA 138 139  K 3.7 3.8  CL 106 105  CO2 22 23  GLUCOSE 101*  97  BUN 99* 103*  CREATININE 2.59* 2.76*  CALCIUM 8.1* 8.1*   LFT Recent Labs    11/07/19 0428 11/08/19 0501  PROT 5.4* 5.2*  ALBUMIN 2.4* 2.4*  AST 14* 16  ALT 9 11  ALKPHOS 26* 26*  BILITOT 0.4 0.4   PT/INR Recent Labs    11/08/19 0501 11/09/19 0629  LABPROT 12.6 13.0  INR 1.0 1.0   Hepatitis Panel No results for input(s): HEPBSAG, HCVAB, HEPAIGM, HEPBIGM in the last 72 hours.  Studies/Results: No results found.  ASSESMENT:   *   Ascites.  Cirrhosis per ultrasound.  SAAG  1.1 Has reaccumulated ascites after 2 separate 5 L taps last week. Current diuretic management is torsemide 100 mg daily Generally for cirrhosis related ascites GI prefers combination of Aldactone and Lasix.  *    Volume overload.  On torsemide.  *     Acute of chronic kidney dz.    *   pulmunary htn   On Sildenafil.      PLAN   *    Per Dr Loletha Carrow.    *   Will need to coordinate diuretic therapy with the renal team but again preference on the part of GI is for Aldactone and Lasix rather than torsemide.    Azucena Freed  11/09/2019, 12:53 PM Phone [phone]  I have discussed the case with the PA, and that is the plan I formulated. I personally interviewed and examined the patient.  Extensive chart review performed, as I had not previously seen the patient.  She was seen in consultation last week by my partner Scarlette Shorts.  CC: Refractory ascites  I do not think we can know with certainty if she truly has cirrhosis.  Her ascites is behaving like portal hypertension, so whether it is sinusoidal from cirrhotic hepatic parenchyma or post sinusoidal from pulmonary hypertension seems academic at this point.  The ultimate pathophysiology requires reduction of pulmonary pressure if feasible with medicines, and a diuretic regimen that her renal function will tolerate.  Naturally, this is all made more challenging by her hypoalbuminemia.  As noted above, we typically treat portal hypertensive ascites with a combination of furosemide (or other loop diuretic) and spironolactone.  This problem of decreased effective circulating volume is a high aldosterone state.  It also seems she will likely need periodic therapeutic paracentesis with albumin administration, removing no more than 5 L at a time so as not to get post paracentesis circulatory dysfunction and acute decompensation of her renal function. Please consider having her undergo another therapeutic paracentesis prior to discharge.  Given  the complexity of her renal and cardiopulmonary condition, I think her diuretic/fluid management would be best attended to by those consultants.  Therapeutic paracentesis can be arranged on an outpatient basis with interventional radiology, which we often do for our cirrhotic patients.  She can be seen in follow-up with Dr. Henrene Pastor at our office if needed.  Thanks for asking [place] to reevaluate her, I hope it has been helpful.  Total time 40 minutes, extensive chart review and discussion with patient required.  Nelida Meuse III Office: [phone]

## 2019-11-09 NOTE — TOC Progression Note (Signed)
Transition of Care [facility]) - Progression Note    Patient Details  Name: Vanessa Williamson MRN: [id number] Date of Birth: [birth year]  Transition of Care [facility]) CM/SW Contact  Graves-Bigelow, Ocie Cornfield, RN Phone Number: 11/09/2019, 2:34 PM  Clinical Narrative: Patient's insurance will not cover [place]- Patient will be paying out of pocket for services. Friends are aware that they will need to start Medicaid Application and Disability. CM will continue to monitor for transition of care needs.     Expected Discharge Plan: [place] Barriers to Discharge: Continued Medical Work up  Expected Discharge Plan and Services Expected Discharge Plan: [place] In-house Referral: NA Discharge Planning Services: CM Consult Post Acute Care Choice: [place] arrangements for the past 2 months: [place]: PT [place]: [place] ([place]) Date [place]: 11/08/19 Time [place]: 1212 Representative spoke with at [place]: [place] ([place]) Interventions    Readmission Risk Interventions No flowsheet data found.

## 2019-11-09 NOTE — Progress Notes (Addendum)
Physical Therapy Treatment Patient Details Name: Vanessa Williamson MRN: [id number] DOB: December 08, 1956 Today's Date: 11/09/2019    History of Present Illness 63 year old F with significant PMH of PAH, HFpEF, COPD, HTN, and CKD who presented with dyspnea, volume overload, and new ascites.    PT Comments    Mod assist for supine to sit, min A sit to stand, pt ambulated 4' with RW then performed marching in standing x 10 reps bilaterally using RW. Activity tolerance limited by fatigue. Instructed pt in BLE strengthening exercises. Frequent, long rest breaks required between bouts of activity 2* fatigue (pt required 12 minutes of rest sitting at edge of bed after supine to sit prior to feeling ready to stand).  Pt reported mild dizziness in sitting initially, this resolved after ~3 minutes, she denied dizziness in standing.    Follow Up Recommendations  SNF;Home health PT(SNF recommended, noted pt's insurance doesn't cover this, so HHPT now recommended)     Equipment Recommendations  Wheelchair (measurements PT);Wheelchair cushion (measurements PT);3in1 (PT);Other (comment)(rollator)    Recommendations for Other Services       Precautions / Restrictions Precautions Precautions: Fall Restrictions Weight Bearing Restrictions: No    Mobility  Bed Mobility Overal bed mobility: Needs Assistance Bed Mobility: Supine to Sit     Supine to sit: Mod assist;HOB elevated     General bed mobility comments: mod A to raise trunk, dizzy upon sitting this resolved after several minutes  Transfers Overall transfer level: Needs assistance Equipment used: Rolling walker (2 wheeled) Transfers: Sit to/from Stand Sit to Stand: Min assist;From elevated surface Stand pivot transfers: Min guard       General transfer comment: assist to rise  Ambulation/Gait Ambulation/Gait assistance: Min guard Gait Distance (Feet): 4 Feet Assistive device: Rolling walker (2 wheeled) Gait Pattern/deviations:  Step-to pattern;Decreased stride length;Wide base of support;Trunk flexed Gait velocity: reduced   General Gait Details: pt took several pivotal steps from bed to recliner, then stood in front of recliner and performed marching in standing with RW, tolerance limited by fatigue, ambulated with 3L O2   Stairs             Wheelchair Mobility    Modified Rankin (Stroke Patients Only)       Balance Overall balance assessment: Needs assistance Sitting-balance support: Feet supported Sitting balance-Leahy Scale: Fair Sitting balance - Comments: pt sat EOB x 10 minutes   Standing balance support: Bilateral upper extremity supported;During functional activity Standing balance-Leahy Scale: Fair                              Cognition Arousal/Alertness: Awake/alert Behavior During Therapy: WFL for tasks assessed/performed Overall Cognitive Status: Within Functional Limits for tasks assessed                                        Exercises General Exercises - Lower Extremity Ankle Circles/Pumps: AROM;Both;20 reps;Supine Quad Sets: AROM;Both;5 reps;Supine Hip Flexion/Marching: AROM;Both;10 reps;Standing    General Comments        Pertinent Vitals/Pain Pain Assessment: No/denies pain    Home Living                      Prior Function            PT Goals (current goals can now be found in the care  plan section) Acute Rehab PT Goals Patient Stated Goal: to get stronger PT Goal Formulation: With patient Time For Goal Achievement: 11/19/19 Potential to Achieve Goals: Good Progress towards PT goals: Progressing toward goals    Frequency    Min 2X/week      PT Plan Current plan remains appropriate    Co-evaluation              AM-PAC PT "6 Clicks" Mobility   Outcome Measure  Help needed turning from your back to your side while in a flat bed without using bedrails?: A Little Help needed moving from lying on your  back to sitting on the side of a flat bed without using bedrails?: A Lot Help needed moving to and from a bed to a chair (including a wheelchair)?: A Lot Help needed standing up from a chair using your arms (e.g., wheelchair or bedside chair)?: A Lot Help needed to walk in hospital room?: A Little Help needed climbing 3-5 steps with a railing? : Total 6 Click Score: 13    End of Session Equipment Utilized During Treatment: Gait belt;Oxygen Activity Tolerance: Patient limited by fatigue Patient left: with call bell/phone within reach;in chair;with nursing/sitter in room Nurse Communication: Mobility status PT Visit Diagnosis: Unsteadiness on feet (R26.81);Muscle weakness (generalized) (M62.81);Adult, failure to thrive (R62.7)     Time: 1010-1051 PT Time Calculation (min) (ACUTE ONLY): 41 min  Charges:  $Therapeutic Exercise: 8-22 mins $Therapeutic Activity: 23-37 mins           [name] PT 11/09/2019  Acute Rehabilitation Services Pager [phone] Office [phone]

## 2019-11-09 NOTE — Progress Notes (Addendum)
   Subjective: Pt seen at the bedside this morning. Anxious after discussion with case management regarding inability to go to SNF. She does not feel safe to go home as she lives alone and has no family/friends who can help her. Pt and her friends are working to get a lock box with the fire department for emergencies. Pt endorses some abdominal discomfort at this time.  Objective:  Vital signs in last 24 hours: Vitals:   11/08/19 1950 11/08/19 2040 11/09/19 0506 11/09/19 0705  BP:  109/70 123/73   Pulse:  90 85   Resp:      Temp:  (!) 97.5 F (36.4 C) 97.8 F (36.6 C)   TempSrc:  Oral Oral   SpO2: 97% 94% 97%   Weight:    86.7 kg  Height:       Physical Exam Vitals signs and nursing note reviewed.  Constitutional:      Appearance: She is ill-appearing (chronically).  Pulmonary:     Effort: Pulmonary effort is normal.     Comments: On 3L [place] Abdominal:     Comments: Abdominal distension worse today from previous. Dull to percussion. No tenderness to palpation.  Musculoskeletal:     Comments: Bilateral LE pitting edema, unchanged from yesterday  Skin:    General: Skin is warm and dry.  Neurological:     Mental Status: She is alert.  Psychiatric:        Mood and Affect: Mood is depressed.    Assessment/Plan:  Principal Problem:   AKI (acute kidney injury) ([place]) Active Problems:   Anemia in chronic kidney disease (CKD)   Acute diastolic congestive heart failure ([place])   Pulmonary arterial hypertension ([place])   Palliative care encounter   Decompensated liver disease ([place])   Hypervolemia   Abdominal ascites   Ms. Christine is a 63 yearold Fwith significant PMH of PAH,HFpEF,COPD, HTN,and CKDwho presented withdyspnea and new ascites consistent with fluid overload from multi-organ failure.    Hypervolemia Driving etiology unclear as likely multifactorial between cardiac, liver, and renal causes -  - Crstable now between 2.5-2.7 - urine output -900ccyesterday -  question accuracy of I/Os (minimal intake recorded) - normal UA and insignificant urine protein/creatine ratio, not indicative of nephrotic process  With reaccumulation of ascites and unclear driver for her decompensation, will reach out to GI today for any additional thoughts or options regarding biopsy vs spironolactone. - appreciate their input! - pt may also need additional therapeutic paracenteses   -PT/OTrecommending SNF, though her insurance does not have a SNF benefit - will reach out to social workregarding home health vs ?hospice options - [facility] to [facility]   Nephrology consulted, appreciate their recommendations -PO torsemide 100mg  PO daily - metolazone to 5mg daily -renal ultrasound with medical renal disease -no indication forrenal biopsy   PAH- WHO group1 -continue sildenafil 20mg  tid   Diet - heart healthy Fluids - none DVT ppx- heparin 5,000 units subQ q8h CODE STATUS - DNR   Dispo: Anticipated dischargepending medical stabilization and home health needs   [name], MD 11/09/2019, 7:22 AM Pager: [phone]

## 2019-11-09 NOTE — Progress Notes (Deleted)
Office Visit Note  Patient: Vanessa Williamson             Date of Birth: 04-19-1956           MRN: [id number]             PCP: Patient, No Pcp Per Referring: Miquel Dunn, * Visit Date: 11/23/2019 Occupation: _0 @  Subjective:  No chief complaint on file.   History of Present Illness: Vanessa Williamson is a 63 y.o. female ***   Activities of Daily Living:  Patient reports morning stiffness for *** {minute/hour:19697}.   Patient {ACTIONS;DENIES/REPORTS:21021675::"Denies"} nocturnal pain.  Difficulty dressing/grooming: {ACTIONS;DENIES/REPORTS:21021675::"Denies"} Difficulty climbing stairs: {ACTIONS;DENIES/REPORTS:21021675::"Denies"} Difficulty getting out of chair: {ACTIONS;DENIES/REPORTS:21021675::"Denies"} Difficulty using hands for taps, buttons, cutlery, and/or writing: {ACTIONS;DENIES/REPORTS:21021675::"Denies"}  No Rheumatology ROS completed.   PMFS History:  Patient Active Problem List   Diagnosis Date Noted   Abdominal ascites    Hypervolemia    Decompensated liver disease ([place]) 11/02/2019   AKI (acute kidney injury) ([place])    Palliative care encounter    Pulmonary arterial hypertension ([place]) 90/30/0923   Acute diastolic congestive heart failure ([place]) 10/29/2019   (HFpEF) heart failure with preserved ejection fraction ([place]) 09/10/2019   Anemia in chronic kidney disease (CKD) 07/20/2019   Incarcerated ventral hernia 10/16/2017   Ventral hernia 02/03/2014   Unspecified vitamin D deficiency 12/11/2013   Morbid obesity with BMI of 40.0-44.9, adult ([place]) 12/10/2013   Tobacco use disorder 12/10/2013   COPD (chronic obstructive pulmonary disease) ([place]) 08/19/2012   HTN (hypertension) 03/08/2012   GERD (gastroesophageal reflux disease) 03/08/2012   Allergic rhinitis 03/08/2012    Past Medical History:  Diagnosis Date   Anemia    Cancer ([place])    cervical  1970s    Chronic kidney disease    COPD (chronic obstructive pulmonary disease)  (HCC)    GERD (gastroesophageal reflux disease)    Hypertension     Family History  Problem Relation Age of Onset   Hypertension Mother    Heart disease Mother    Hyperlipidemia Mother    Heart disease Father    Hypertension Father    Heart disease Maternal Grandmother    Heart disease Maternal Grandfather    Cancer Sister    Stroke Brother    Hypertension Brother    Colon cancer Neg Hx    Colon polyps Neg Hx    Esophageal cancer Neg Hx    Stomach cancer Neg Hx    Rectal cancer Neg Hx    Past Surgical History:  Procedure Laterality Date   BREAST BIOPSY Left    benign   CERVIX LESION DESTRUCTION     frozen   COLONOSCOPY  03/09/2008   EYE SURGERY     laser   FRACTURE SURGERY     HUMERUS FRACTURE SURGERY     left  2005   IR PARACENTESIS  11/03/2019   RIGHT HEART CATH N/A 11/02/2019   Procedure: RIGHT HEART CATH;  Surgeon: Adrian Prows, MD;  Location: [facility];  Service: Cardiovascular;  Laterality: N/A;   RIGHT/LEFT HEART CATH AND CORONARY ANGIOGRAPHY N/A 09/14/2019   Procedure: RIGHT/LEFT HEART CATH AND CORONARY ANGIOGRAPHY;  Surgeon: Adrian Prows, MD;  Location: [facility];  Service: Cardiovascular;  Laterality: N/A;   VENTRAL HERNIA REPAIR N/A 10/16/2017   Procedure: OPEN VENTRAL HERNIA  REPAIR ERAS PATHWAY;  Surgeon: Alphonsa Overall, MD;  Location: [facility];  Service: General;  Laterality: N/A;   Social History  Social History Narrative   Single. Education: [employer].   Immunization History  Administered Date(s) Administered   Influenza,inj,Quad PF,6+ Mos 09/14/2013, 08/28/2017, 09/12/2019   Pneumococcal Polysaccharide-23 10/17/2017   Tdap 12/24/2007     Objective: Vital Signs: There were no vitals taken for this visit.   Physical Exam   Musculoskeletal Exam: ***  CDAI Exam: CDAI Score: -- Patient Global: --; Provider Global: -- Swollen: --; Tender: -- Joint Exam   No joint exam has been documented for this visit    There is currently no information documented on the homunculus. Go to the Rheumatology activity and complete the homunculus joint exam.  Investigation: Findings:  09/15/19: Ro 4.5, La-, ESR 125, RF<10, HIV-, GBM Ab 7, CRP<0.8, Scl-70-, dsDNA 3, Ace 46, ANA-, ANCA <3.5, myeloperoxidase ab-  Component     Latest Ref Rng & Units 09/15/2019  Myeloperoxidase Abs     0.0 - 9.0 U/mL <9.0  ANCA Proteinase 3     0.0 - 3.5 U/mL <3.5  ANA Ab, IFA      Negative  Angiotensin-Converting Enzyme     14 - 82 U/L 46  ds DNA Ab     0 - 9 IU/mL 3  Scleroderma (Scl-70) (ENA) Antibody, IgG     0.0 - 0.9 AI <0.2  CRP     <1.0 mg/dL <0.8  GBM Ab     0 - 20 units 7  HIV Screen 4th Generation wRfx     Non Reactive Non Reactive  RA Latex Turbid.     0.0 - 13.9 IU/mL <10.0  Sed Rate     0 - 22 mm/hr 125 (H)  SSA (Ro) (ENA) Antibody, IgG     0.0 - 0.9 AI 4.5 (H)  SSB (La) (ENA) Antibody, IgG     0.0 - 0.9 AI <0.2   Imaging: Ct Abdomen Pelvis Wo Contrast  Result Date: 10/29/2019 CLINICAL DATA:  Abdominal distension, constipation EXAM: CT ABDOMEN AND PELVIS WITHOUT CONTRAST TECHNIQUE: Multidetector CT imaging of the abdomen and pelvis was performed following the standard protocol without IV contrast. COMPARISON:  CT abdomen pelvis September 20 06/11/2017, CT chest September 13, 2019 FINDINGS: Lower chest: There is mild cardiomegaly. Streaky opacities, likely atelectasis or scarring seen at both lung bases. No hiatal hernia. Hepatobiliary: Although limited due to the lack of intravenous contrast, normal in appearance without gross focal abnormality. No evidence of calcified gallstones or biliary ductal dilatation. Pancreas:  Limited visualization due to massive ascites. Spleen: Normal in size. Although limited due to the lack of intravenous contrast, normal in appearance. Adrenals/Urinary Tract: Both adrenal glands appear normal. The kidneys and collecting system appear normal without evidence of urinary  tract calculus or hydronephrosis. Bladder is unremarkable. Stomach/Bowel: The stomach, small bowel, and colon are grossly unremarkable, however limited due to ascites. There is a moderate amount colonic stool. Vascular/Lymphatic: There is a large amount of abdominopelvic ascites which limits evaluation of the solid organs. Scattered aortic atherosclerotic calcifications are seen without aneurysmal dilatation. Reproductive: Limited visualization. The uterus appears grossly unremarkable. Other: Diffuse anasarca seen throughout. Musculoskeletal: No acute or significant osseous findings. Grade 1 anterolisthesis of L4 on L5 is seen. IMPRESSION: Large amount of abdominopelvic ascites and extensive diffuse anasarca. due to the ascites there is limited visualization of abdominopelvic or in, however no definite gross abnormalities. Aortic Atherosclerosis (ICD10-I70.0). Streaky atelectasis seen at both lung bases. Electronically Signed   By: Prudencio Pair M.D.   On: 10/29/2019 21:54   Dg Chest 2 View  Result Date: 10/29/2019 CLINICAL DATA:  Shortness of breath. History of congestive heart failure. EXAM: CHEST - 2 VIEW COMPARISON:  September 15, 2019 chest radiograph; chest CT September 13, 2019 FINDINGS: There is underlying fibrotic type change. There is atelectatic change in portions of each upper lobe and left base. There is no frank edema or consolidation. There is cardiomegaly with pulmonary vascularity within normal limits. No adenopathy. There is a total shoulder replacement on the left. IMPRESSION: Underlying interstitial fibrosis with areas of atelectatic change. No frank edema or consolidation. Cardiomegaly, unchanged.  No adenopathy evident. Electronically Signed   By: Lowella Grip III M.D.   On: 10/29/2019 15:21   Dg Ankle Complete Left  Result Date: 10/29/2019 CLINICAL DATA:  Pain following fall EXAM: LEFT ANKLE COMPLETE - 3+ VIEW COMPARISON:  None. FINDINGS: Frontal, oblique, and lateral views were  obtained. There is generalized soft tissue swelling. There is no demonstrable fracture or joint effusion. There is no joint space narrowing or erosion. The ankle mortise appears intact. There is a small inferior calcaneal spur. IMPRESSION: Soft tissue swelling. No appreciable fracture or joint space narrowing. Ankle mortise appears intact. There is a small inferior calcaneal spur. Electronically Signed   By: Lowella Grip III M.D.   On: 10/29/2019 15:22   US Abdomen Complete  Result Date: 11/02/2019 CLINICAL DATA:  Ascites. EXAM: ABDOMEN ULTRASOUND COMPLETE COMPARISON:  CT dated October 29, 2019 FINDINGS: Gallbladder: There is gallbladder wall thickening with the gallbladder measuring approximately 5 mm. The sonographic Percell Miller sign is negative. No gallstones are identified. Common bile duct: Diameter: 4 mm Liver: The liver surface appears nodular consistent with cirrhosis. Portal vein is patent on color Doppler imaging with normal direction of blood flow towards the liver. IVC: No abnormality visualized. Pancreas: Visualized portion unremarkable. Spleen: Size and appearance within normal limits. Right Kidney: Length: 10.2 cm. The right kidney is echogenic. There is no hydronephrosis. Left Kidney: Length: 9 cm. There is no hydronephrosis. The kidney is echogenic. Abdominal aorta: There is no abdominal aortic aneurysm. Atherosclerotic changes are noted. Other findings: There is a large volume of abdominal ascites. There is a right-sided pleural effusion. IMPRESSION: 1. Moderate to large volume of abdominal ascites. 2. Cirrhosis without evidence for a discrete hepatic mass. 3. Gallbladder wall thickening without evidence for cholelithiasis. Gallbladder wall thickening is nonspecific can be seen in the presence of ascites. 4. Right-sided pleural effusion. 5. Echogenic kidneys bilaterally which can be seen in patients with medical renal disease or other causes of renal insufficiency. Electronically Signed   By:  Constance Holster M.D.   On: 11/02/2019 20:17   US Renal  Result Date: 10/31/2019 CLINICAL DATA:  Acute kidney injury, increased BUN and creatinine, history of hypertension and CKD EXAM: RENAL / URINARY TRACT ULTRASOUND COMPLETE COMPARISON:  CT abdomen pelvis 10/29/2019 FINDINGS: Right Kidney: Renal measurements: 9.8 x 4.6 x 6.1 cm = volume: 143.3 mL. Increased renal cortical echogenicity. No concerning renal mass, urolithiasis or hydronephrosis. Left Kidney: Poorly visualized due to bowel gas. Renal measurements: 8.8 x 5.1 x 5.4 cm = volume: 125.7 mL. Increased renal echogenicity. No renal mass, calculus or hydronephrosis in the visualized portions of the left kidney. Bladder: Appears normal for degree of bladder distention. Other: Nodular hepatic surface contour and moderate volume ascites. IMPRESSION: Bilateral increased renal cortical echogenicity compatible with medical renal disease. Nodular opacity surface contour, can be seen with cirrhosis. Moderate volume ascites. Electronically Signed   By: Lovena Le M.D.   On: 10/31/2019 22:53  US Paracentesis  Result Date: 10/31/2019 INDICATION: Chronic congestive heart failure. Anasarca. Ascites. Request for therapeutic paracentesis up to 5 L. EXAM: ULTRASOUND GUIDED PARACENTESIS MEDICATIONS: 1% lidocaine 15 mL COMPLICATIONS: None immediate. PROCEDURE: Informed written consent was obtained from the patient after a discussion of the risks, benefits and alternatives to treatment. A timeout was performed prior to the initiation of the procedure. Initial ultrasound scanning demonstrates a large amount of ascites within the right lateral abdomen. The right lateral abdomen was prepped and draped in the usual sterile fashion. 1% lidocaine was used for local anesthesia. Following this, a 19 gauge, 10-cm, Yueh catheter was introduced. An ultrasound image was saved for documentation purposes. The paracentesis was performed. The catheter was removed and a dressing  was applied. The patient tolerated the procedure well without immediate post procedural complication. FINDINGS: A total of approximately 5 L of clear yellow fluid was removed. IMPRESSION: Successful ultrasound-guided paracentesis yielding 5 liters of peritoneal fluid. Read by: Gareth Eagle, PA-C Electronically Signed   By: Lucrezia Europe M.D.   On: 10/31/2019 10:39   [place] Ekg Site Rite  Result Date: 11/01/2019 If Site Rite image not attached, placement could not be confirmed due to current cardiac rhythm.  [place] Ekg Site Rite  Result Date: 10/31/2019 If Site Rite image not attached, placement could not be confirmed due to current cardiac rhythm.  Ir Paracentesis  Result Date: 11/03/2019 INDICATION: Patient with history of chronic HF, anasarca, and recurrent ascites. Request is made for diagnostic and therapeutic paracentesis with a max of 5 L. EXAM: ULTRASOUND GUIDED DIAGNOSTIC AND THERAPEUTIC PARACENTESIS MEDICATIONS: 10 mL 1% lidocaine COMPLICATIONS: None immediate. PROCEDURE: Informed written consent was obtained from the patient after a discussion of the risks, benefits and alternatives to treatment. A timeout was performed prior to the initiation of the procedure. Initial ultrasound scanning demonstrates a large amount of ascites within the right lower abdominal quadrant. The right lower abdomen was prepped and draped in the usual sterile fashion. 1% lidocaine was used for local anesthesia. Following this, a 19 gauge, 10-cm, Yueh catheter was introduced. An ultrasound image was saved for documentation purposes. The paracentesis was performed. The catheter was removed and a dressing was applied. The patient tolerated the procedure well without immediate post procedural complication. FINDINGS: A total of approximately 5.0 L of clear yellow fluid was removed. Samples were sent to the laboratory as requested by the clinical team. IMPRESSION: Successful ultrasound-guided paracentesis yielding 5.0 L of peritoneal  fluid. Read by: Earley Abide, PA-C Electronically Signed   By: Sandi Mariscal M.D.   On: 11/03/2019 16:19    Recent Labs: Lab Results  Component Value Date   WBC 3.4 (L) 11/08/2019   HGB 9.4 (L) 11/08/2019   PLT 175 11/08/2019   NA 139 11/08/2019   K 3.8 11/08/2019   CL 105 11/08/2019   CO2 23 11/08/2019   GLUCOSE 97 11/08/2019   BUN 103 (H) 11/08/2019   CREATININE 2.76 (H) 11/08/2019   BILITOT 0.4 11/08/2019   ALKPHOS 26 (L) 11/08/2019   AST 16 11/08/2019   ALT 11 11/08/2019   PROT 5.2 (L) 11/08/2019   ALBUMIN 2.4 (L) 11/08/2019   CALCIUM 8.1 (L) 11/08/2019   GFRAA 20 (L) 11/08/2019    Speciality Comments: No specialty comments available.  Procedures:  No procedures performed Allergies: Patient has no known allergies.   Assessment / Plan:     Visit Diagnoses: No diagnosis found.  Orders: No orders of the defined types were placed in  this encounter.  No orders of the defined types were placed in this encounter.   Face-to-face time spent with patient was *** minutes. Greater than 50% of time was spent in counseling and coordination of care.  Follow-Up Instructions: No follow-ups on file.   Ofilia Neas, PA-C  Note - This record has been created using Dragon software.  Chart creation errors have been sought, but may not always  have been located. Such creation errors do not reflect on  the standard of medical care.

## 2019-11-10 ENCOUNTER — Inpatient Hospital Stay (HOSPITAL_COMMUNITY): Payer: No Typology Code available for payment source

## 2019-11-10 ENCOUNTER — Encounter (HOSPITAL_COMMUNITY): Payer: Self-pay | Admitting: Radiology

## 2019-11-10 HISTORY — PX: IR PARACENTESIS: IMG2679

## 2019-11-10 LAB — RENAL FUNCTION PANEL
Albumin: 2.5 g/dL — ABNORMAL LOW (ref 3.5–5.0)
Anion gap: 12 (ref 5–15)
BUN: 107 mg/dL — ABNORMAL HIGH (ref 8–23)
CO2: 23 mmol/L (ref 22–32)
Calcium: 8.2 mg/dL — ABNORMAL LOW (ref 8.9–10.3)
Chloride: 104 mmol/L (ref 98–111)
Creatinine, Ser: 2.88 mg/dL — ABNORMAL HIGH (ref 0.44–1.00)
GFR calc Af Amer: 19 mL/min — ABNORMAL LOW (ref >60)
GFR calc non Af Amer: 17 mL/min — ABNORMAL LOW (ref >60)
Glucose, Bld: 99 mg/dL (ref 70–99)
Phosphorus: 5.9 mg/dL — ABNORMAL HIGH (ref 2.5–4.6)
Potassium: 3.8 mmol/L (ref 3.5–5.1)
Sodium: 139 mmol/L (ref 135–145)

## 2019-11-10 LAB — GLUCOSE, CAPILLARY
Glucose-Capillary: 113 mg/dL — ABNORMAL HIGH (ref 70–99)
Glucose-Capillary: 110 mg/dL — ABNORMAL HIGH (ref 70–99)
Glucose-Capillary: 136 mg/dL — ABNORMAL HIGH (ref 70–99)
Glucose-Capillary: 120 mg/dL — ABNORMAL HIGH (ref 70–99)
Glucose-Capillary: 393 mg/dL — ABNORMAL HIGH (ref 70–99)
Glucose-Capillary: 148 mg/dL — ABNORMAL HIGH (ref 70–99)

## 2019-11-10 LAB — CBC
HCT: 30.7 % — ABNORMAL LOW (ref 36.0–46.0)
Hemoglobin: 10.1 g/dL — ABNORMAL LOW (ref 12.0–15.0)
MCH: 34.7 pg — ABNORMAL HIGH (ref 26.0–34.0)
MCHC: 32.9 g/dL (ref 30.0–36.0)
MCV: 105.5 fL — ABNORMAL HIGH (ref 80.0–100.0)
Platelets: 213 10*3/uL (ref 150–400)
RBC: 2.91 MIL/uL — ABNORMAL LOW (ref 3.87–5.11)
RDW: 18.6 % — ABNORMAL HIGH (ref 11.5–15.5)
WBC: 3.7 10*3/uL — ABNORMAL LOW (ref 4.0–10.5)
nRBC: 0 % (ref 0.0–0.2)

## 2019-11-10 LAB — PROTIME-INR
INR: 0.9 (ref 0.8–1.2)
Prothrombin Time: 12.4 seconds (ref 11.4–15.2)

## 2019-11-10 MED ORDER — ALBUMIN HUMAN 5 % IV SOLN
INTRAVENOUS | Status: AC
  Filled 2019-11-10: qty 250

## 2019-11-10 MED ORDER — LIDOCAINE HCL 1 % IJ SOLN
INTRAMUSCULAR | Status: AC
  Filled 2019-11-10: qty 20

## 2019-11-10 MED ORDER — ALBUMIN HUMAN 25 % IV SOLN
25.0000 g | Freq: Once | INTRAVENOUS | Status: AC
  Administered 2019-11-10: 12.5 g via INTRAVENOUS
  Filled 2019-11-10: qty 50

## 2019-11-10 MED ORDER — LIDOCAINE HCL (PF) 1 % IJ SOLN
INTRAMUSCULAR | Status: DC | PRN
  Administered 2019-11-10: 10 mL

## 2019-11-10 MED ORDER — ALBUMIN HUMAN 25 % IV SOLN
25.0000 g | Freq: Once | INTRAVENOUS | Status: AC
  Administered 2019-11-10: 12.5 g via INTRAVENOUS
  Filled 2019-11-10: qty 100

## 2019-11-10 NOTE — TOC Progression Note (Signed)
Transition of Care [facility]) - Progression Note    Patient Details  Name: Vanessa Williamson MRN: [id number] Date of Birth: 1956/07/17  [facility]) CM/SW Contact  Graves-Bigelow, Ocie Cornfield, RN Phone Number: 11/10/2019, 4:08 PM  Clinical Narrative:   CM called CIR to see if patient is a candidate for Inpatient Rehab and if Insurance will pay. Admissions Coordinator has asked for consult. CM will continue to follow for additional transition of care needs.   Expected Discharge Plan: [place] Barriers to Discharge: Continued Medical Work up  Expected Discharge Plan and Services Expected Discharge Plan: [place] In-house Referral: NA Discharge Planning Services: CM Consult Post Acute Care Choice: [place] arrangements for the past 2 months: [place]: PT [place]: [place] ([place]) Date [place]: 11/08/19 Time [place]: 1212 Representative spoke with at [place]: [place] ([place]) Interventions    Readmission Risk Interventions No flowsheet data found.

## 2019-11-10 NOTE — Progress Notes (Signed)
   Subjective: Pt seen at the bedside this morning. States her abdomen is more distended today and it is uncomfortable. Pt appears down today and is frustrated that she is unable to go to a rehab facility or get Medicare benefits. Has no acute concerns at this time.  Objective:  Vital signs in last 24 hours: Vitals:   11/09/19 1429 11/09/19 1951 11/10/19 0427 11/10/19 0439  BP: 113/67 108/68 113/71   Pulse: 90 95 86   Resp: 20 20 19    Temp: 98 F (36.7 C) 98.2 F (36.8 C) 97.6 F (36.4 C)   TempSrc: Oral Oral Oral   SpO2: 94% 94% 96%   Weight:    86 kg  Height:       Physical Exam Vitals signs and nursing note reviewed.  Constitutional:      Appearance: She is ill-appearing.  Pulmonary:     Effort: Pulmonary effort is normal.     Comments: On 3L [place]. Abdominal:     Comments: Abdomen distended, non tender to palpation, dull to percussion.  Musculoskeletal:     Comments: Bilateral lower extremity edema worsening from yesterday. Pitting up to thigh.  Skin:    General: Skin is warm and dry.  Neurological:     Mental Status: She is alert.    Assessment/Plan:  Principal Problem:   AKI (acute kidney injury) ([place]) Active Problems:   Anemia in chronic kidney disease (CKD)   Acute diastolic congestive heart failure ([place])   Pulmonary arterial hypertension ([place])   Palliative care encounter   Decompensated liver disease ([place])   Hypervolemia   Abdominal ascites  Ms. Buffalo is a 63 yearold Fwith significant PMH of PAH,HFpEF,COPD, HTN,and CKDwho presented withdyspnea and new ascitesconsistent with fluid overload from multi-organ failure.    Hypervolemia Driving etiology unclear as likely multifactorial between cardiac, liver, and renal causes - Crslightly worse today at 2.88 << 2.73 << 2.76 << 2.59 - net +63cc with urine output -600ccyesterday - currently on torsemide 100mg  PO daily  - consult to IR today for therapeutic paracentesis for reaccumulation of ascites    - 5L max and 25g albumin IV  GI consulted yesterday, appreciate their input - would recommend diuresis with furosemide and spironolactone - periodic therapeutic paracentesis with albumin and 5L max outpatient with IR - diuretic/fluid management per nephro and cards  Nephrology consulted, appreciate their recommendations - stopped metolazone yesterday - if renal function improved enough, could switch to furosemide/spironolactone - monitor GFR -renal ultrasound with medical renal disease -no indication forrenal biopsy   -continue to work with PT/OTduring this admission - palliative care to follow at discharge - per case manager, pt starting medicaid and disability application   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

## 2019-11-10 NOTE — Procedures (Signed)
PROCEDURE SUMMARY:  Successful US guided paracentesis from RLQ.  Yielded 5 L of clear yellow fluid.  No immediate complications.  Pt tolerated well.   Specimen was not sent for labs.  EBL < 20mL  Ascencion Dike PA-C 11/10/2019 3:35 PM

## 2019-11-10 NOTE — Progress Notes (Signed)
  [facility] Progress Note    Assessment/ Plan:   1. AoCKD 3, follows with Dr. Moshe Cipro as outpatient, creatinine 1.2 08/2019.  Historically borderline nephrotic proteinuria but not present here.  Here was massively overloaded but does not appear to have primary issue being cardiac or hepatic, has diuresed effectively. Renal ultrasound medical renal disease, no acute structural issues.  Etiology driving this, unclear.  Serologies: ANCA, GBM, ANA, dsDNA negative. HBV, HCV, HIV neg, sFLC consisitent with low GFR. In absence of proteinuria and improving GFR no indication for renal biopsy.  On PO diuretics-- keep torsemide.  UA clear.  Metolazone stopped to see if renal function could improve enough to switch to Lasix/ aldactone combination--> unclear if eGFR will become adequate to do so.  Weights continue to decrease even off metolazone. 2. Question of right heart failure/cor pulmonale: on sildenafil.  3. COPD, on chronic oxygen 4. Mild hyperkalemia: resolved 5. Ascites, and cirrhosis- GI reconsulted today, appreciate their recommendations.   6. Dispo: pending  Subjective:    Going to get paracentesis today.  IV albumin in preparation.  Metolazone stopped.     Objective:   BP 113/71 (BP Location: Left Arm)   Pulse 86   Temp 97.6 F (36.4 C) (Oral)   Resp 19   Ht '5\' 4"'$  (1.626 m)   Wt 86 kg   SpO2 98%   BMI 32.54 kg/m   Intake/Output Summary (Last 24 hours) at 11/10/2019 1351 Last data filed at 11/10/2019 0428 Gross per 24 hour  Intake 663 ml  Output 600 ml  Net 63 ml   Weight change:   Physical Exam: Gen: sitting in chair, sleeping, arousable CVS: RRR no m/r/g Resp: clear bilaterally no c/w/r, on O2 Abd: soft, nondistended, NABS--> + ascites with fluid wave Ext: 2+ LLE edema, 1+ RLE edema  Imaging: No results found.  Labs: BMET Recent Labs  Lab 11/04/19 0520 11/05/19 0529 11/06/19 0500 11/07/19 0428 11/08/19 0501 11/09/19 1300 11/10/19 0500   NA 140 141 139 138 139 136 139  K 4.4 3.9 3.9 3.7 3.8 3.6 3.8  CL 111 109 107 106 105 103 104  CO2 21* '22 23 22 23 23 23  '$ GLUCOSE 97 119* 97 101* 97 105* 99  BUN 100* 99* 100* 99* 103* 106* 107*  CREATININE 3.00* 2.68* 2.52* 2.59* 2.76* 2.73* 2.88*  CALCIUM 8.2* 8.0* 8.0* 8.1* 8.1* 8.1* 8.2*  PHOS  --   --   --   --   --   --  5.9*   CBC Recent Labs  Lab 11/07/19 0705 11/08/19 0501 11/09/19 1300 11/10/19 0500  WBC 3.0* 3.4* 3.9* 3.7*  NEUTROABS  --   --  2.7  --   HGB 8.8* 9.4* 9.3* 10.1*  HCT 25.7* 27.9* 28.0* 30.7*  MCV 101.6* 103.0* 104.1* 105.5*  PLT 156 175 199 213    Medications:    . budesonide  0.25 mg Nebulization BID  . Chlorhexidine Gluconate Cloth  6 each Topical Daily  . heparin  5,000 Units Subcutaneous Q8H  . lidocaine      . lidocaine  1 application Urethral Once  . sildenafil  20 mg Oral TID  . sodium chloride flush  10-40 mL Intracatheter Q12H  . sodium chloride flush  3 mL Intravenous Q12H  . torsemide  100 mg Oral Daily      Madelon Lips, MD 11/10/2019, 1:51 PM

## 2019-11-10 NOTE — Progress Notes (Addendum)
Pt CBG 393- texted IM on call via amion with this information  Recheck CBG 120 relayed this information to Dr Sheppard Coil

## 2019-11-10 NOTE — Progress Notes (Signed)
Rehab Admissions Coordinator Note:  Per CM request, this patient was screened by Raechel Ache for appropriateness for an Inpatient Acute Rehab Consult.  At this time, we are recommending Inpatient Rehab consult to allow for further evaluation of pt's tolerance and candidacy for CIR. AC will contact MD to request consult order.   Raechel Ache 11/10/2019, 4:01 PM  I can be reached at [phone].

## 2019-11-11 ENCOUNTER — Ambulatory Visit: Admitting: Cardiology

## 2019-11-11 LAB — GLUCOSE, CAPILLARY: Glucose-Capillary: 111 mg/dL — ABNORMAL HIGH (ref 70–99)

## 2019-11-11 LAB — PROTIME-INR
INR: 1 (ref 0.8–1.2)
Prothrombin Time: 12.8 seconds (ref 11.4–15.2)

## 2019-11-11 LAB — BASIC METABOLIC PANEL
Anion gap: 9 (ref 5–15)
BUN: 106 mg/dL — ABNORMAL HIGH (ref 8–23)
CO2: 23 mmol/L (ref 22–32)
Calcium: 8 mg/dL — ABNORMAL LOW (ref 8.9–10.3)
Chloride: 106 mmol/L (ref 98–111)
Creatinine, Ser: 2.77 mg/dL — ABNORMAL HIGH (ref 0.44–1.00)
GFR calc Af Amer: 20 mL/min — ABNORMAL LOW (ref >60)
GFR calc non Af Amer: 17 mL/min — ABNORMAL LOW (ref >60)
Glucose, Bld: 142 mg/dL — ABNORMAL HIGH (ref 70–99)
Potassium: 3.5 mmol/L (ref 3.5–5.1)
Sodium: 138 mmol/L (ref 135–145)

## 2019-11-11 NOTE — TOC Progression Note (Signed)
Transition of Care [facility]) - Progression Note    Patient Details  Name: DAPHNIE VENTURINI MRN: [id number] Date of Birth: 03/12/1956  [facility] CM/SW Contact  Graves-Bigelow, Ocie Cornfield, RN Phone Number: 11/11/2019, 11:00 AM  Clinical Narrative: CM received notification that patient is not a candidate for CIR at this time. PT/OT will have to work with patient to get her to a point to safely transition home. Pt was [facility] up with [facility] with [facility]- however she will have a full co pay of $130.00 each visit. Patient still has no family that can assist her in the home. CM will continue to follow for transition of care needs.    Expected Discharge Plan: [place] Barriers to Discharge: Continued Medical Work up  Expected Discharge Plan and Services Expected Discharge Plan: [place] In-house Referral: NA Discharge Planning Services: CM Consult Post Acute Care Choice: [place] arrangements for the past 2 months: [place]: PT [place]: [place] ([place]) Date [place]: 11/08/19 Time Center: 1212 Representative spoke with at [place]: [place] ([place]) Interventions    Readmission Risk Interventions No flowsheet data found.

## 2019-11-11 NOTE — Progress Notes (Signed)
[facility] Progress Note    Assessment/ Plan:   1. AoCKD 3, follows with Dr. Moshe Cipro as outpatient, creatinine 1.2 08/2019.  Historically borderline nephrotic proteinuria but not present here.  Here was massively overloaded but does not appear to have primary issue being cardiac or hepatic, has diuresed effectively. Renal ultrasound medical renal disease, no acute structural issues.  Etiology driving this, unclear.  Serologies: ANCA, GBM, ANA, dsDNA negative. HBV, HCV, HIV neg, sFLC consisitent with low GFR. In absence of proteinuria and improving GFR no indication for renal biopsy.  On PO diuretics-- keep torsemide.  UA clear.  Continue to hold torsemide.  Cr stabilized at 2.7 today.   2. Question of right heart failure/cor pulmonale: on sildenafil.  3. COPD, on chronic oxygen 4. Mild hyperkalemia: resolved 5. Ascites, and cirrhosis- GI reconsulted, appreciate recommendations, had paracentesis with 5L off yesterday and albumin.  Watch Bps--> renal function is tenuous and don't want to have a recurrence of kidney injury.    6. Dispo: pending  Subjective:    Metolazone stopped yesterday, now Cr stabilizing out.  5L paracentesis yesterday with albumin.  Stood to weigh- 82 kg.     Objective:   BP 109/61 (BP Location: Left Arm)   Pulse 82   Temp 98.4 F (36.9 C) (Oral)   Resp 19   Ht 5\' 4"  (1.626 m)   Wt 82 kg   SpO2 98%   BMI 31.03 kg/m   Intake/Output Summary (Last 24 hours) at 11/11/2019 1147 Last data filed at 11/11/2019 0800 Gross per 24 hour  Intake 830 ml  Output 500 ml  Net 330 ml   Weight change: -4.672 kg  Physical Exam: Gen: sitting in bed, sleeping, arousable CVS: RRR no m/r/g Resp: clear bilaterally no c/w/r, on O2 Abd: soft, nondistended, NABS--> ascites improved Ext: 2+ LLE edema, 1+ RLE edema  Imaging: Ir Paracentesis  Result Date: 11/10/2019 INDICATION: History of chronic congestive heart failure and chronic renal insufficiency.  Abdominal distention secondary to recurrent ascites. Request for therapeutic paracentesis up to 5 L max. EXAM: ULTRASOUND GUIDED RIGHT LOWER QUADRANT PARACENTESIS MEDICATIONS: None. COMPLICATIONS: None immediate. PROCEDURE: Informed written consent was obtained from the patient after a discussion of the risks, benefits and alternatives to treatment. A timeout was performed prior to the initiation of the procedure. Initial ultrasound scanning demonstrates a large amount of ascites within the right lower abdominal quadrant. The right lower abdomen was prepped and draped in the usual sterile fashion. 1% lidocaine was used for local anesthesia. Following this, a 19 gauge, 7-cm, Yueh catheter was introduced. An ultrasound image was saved for documentation purposes. The paracentesis was performed. The catheter was removed and a dressing was applied. The patient tolerated the procedure well without immediate post procedural complication. FINDINGS: A total of approximately 5 L of clear yellow fluid was removed. IMPRESSION: Successful ultrasound-guided paracentesis yielding 5 liters of peritoneal fluid. Read by: Ascencion Dike PA-C Electronically Signed   By: Corrie Mckusick D.O.   On: 11/10/2019 15:38    Labs: BMET Recent Labs  Lab 11/05/19 0529 11/06/19 0500 11/07/19 0428 11/08/19 0501 11/09/19 1300 11/10/19 0500 11/11/19 1015  NA 141 139 138 139 136 139 138  K 3.9 3.9 3.7 3.8 3.6 3.8 3.5  CL 109 107 106 105 103 104 106  CO2 22 23 22 23 23 23 23   GLUCOSE 119* 97 101* 97 105* 99 142*  BUN 99* 100* 99* 103* 106* 107* 106*  CREATININE 2.68* 2.52* 2.59* 2.76*  2.73* 2.88* 2.77*  CALCIUM 8.0* 8.0* 8.1* 8.1* 8.1* 8.2* 8.0*  PHOS  --   --   --   --   --  5.9*  --    CBC Recent Labs  Lab 11/07/19 0705 11/08/19 0501 11/09/19 1300 11/10/19 0500  WBC 3.0* 3.4* 3.9* 3.7*  NEUTROABS  --   --  2.7  --   HGB 8.8* 9.4* 9.3* 10.1*  HCT 25.7* 27.9* 28.0* 30.7*  MCV 101.6* 103.0* 104.1* 105.5*  PLT 156 175 199  213    Medications:    . budesonide  0.25 mg Nebulization BID  . Chlorhexidine Gluconate Cloth  6 each Topical Daily  . heparin  5,000 Units Subcutaneous Q8H  . lidocaine  1 application Urethral Once  . sildenafil  20 mg Oral TID  . sodium chloride flush  10-40 mL Intracatheter Q12H  . sodium chloride flush  3 mL Intravenous Q12H  . torsemide  100 mg Oral Daily      Madelon Lips, MD 11/11/2019, 11:47 AM

## 2019-11-11 NOTE — Progress Notes (Signed)
Physical Therapy Treatment Patient Details Name: Vanessa Williamson MRN: [id number] DOB: [birth year] Today's Date: 11/11/2019    History of Present Illness 63 year old F with significant PMH of PAH, HFpEF, COPD, HTN, and CKD who presented with dyspnea, volume overload, and new ascites.    PT Comments    Pt in bed upon PT arrival, agreeable to tx session. Pt was able to demo improvements in bed mobility (supervision from [place]) and ambulation distance (40 ft x2 from 4 ft) with use of RW and min guard. Pt reports no change in sx with mobility, but fatigues quickly, ambulating at a speed of 0.33 m/s and requiring a seated rest break after 40 ft. A gait speed less than 0.1m/s indicates increased risk of falls and dependence in ADLs, and therefore the patient will continue to benefit from skilled PT to address limitations in functional endurance and mobility. Per CM, pt insurance will not cover CIR or SNF, so although these would normally be my recommendation given this pt's presentation, home with HHPT is more likely at this point.      Follow Up Recommendations  Home health PT;Supervision/Assistance - 24 hour(Per CM, pt unable to attend SNF or CIR due to lack of insurance coverage, plan for home with HHPT.)     Equipment Recommendations  Wheelchair (measurements PT);Wheelchair cushion (measurements PT);3in1 (PT);Rolling walker with 5" wheels    Recommendations for Other Services       Precautions / Restrictions Precautions Precautions: Fall Precaution Comments: pt c/o dizziness upon coming to sitting from supine, subsides in 5 min and is able to stand without dizziness Restrictions Weight Bearing Restrictions: No    Mobility  Bed Mobility Overal bed mobility: Needs Assistance Bed Mobility: Supine to Sit     Supine to sit: Supervision;HOB elevated     General bed mobility comments: pt able to come to sitting EOB with use of bed rails, VCs for scooting no assist  needed  Transfers Overall transfer level: Needs assistance Equipment used: Rolling walker (2 wheeled) Transfers: Sit to/from Stand   Stand pivot transfers: Min guard       General transfer comment: RW, no assist needed to come to stand  Ambulation/Gait Ambulation/Gait assistance: Min guard Gait Distance (Feet): 40 Feet Assistive device: Rolling walker (2 wheeled) Gait Pattern/deviations: Step-to pattern;Decreased stride length;Wide base of support;Trunk flexed Gait velocity: 0.33 m/s Gait velocity interpretation: <1.31 ft/sec, indicative of household ambulator General Gait Details: 40 ft x 2; ambulates with 3L O2 maintained 98% O2 through ambulation. Pt reports fatigue after 40 ft, seated rest break ~30 sec, then ambulated back to room with min guard.   Stairs             Wheelchair Mobility    Modified Rankin (Stroke Patients Only)       Balance Overall balance assessment: Needs assistance Sitting-balance support: Feet supported Sitting balance-Leahy Scale: Good Sitting balance - Comments: pt sat EOB x 5 minutes to resolve dizziness   Standing balance support: Bilateral upper extremity supported;During functional activity Standing balance-Leahy Scale: Fair                              Cognition Arousal/Alertness: Awake/alert Behavior During Therapy: WFL for tasks assessed/performed Overall Cognitive Status: Within Functional Limits for tasks assessed  Exercises      General Comments        Pertinent Vitals/Pain Pain Assessment: No/denies pain(reports some soreness in back from laying in bed)    Home Living                      Prior Function            PT Goals (current goals can now be found in the care plan section) Acute Rehab PT Goals Patient Stated Goal: to get stronger PT Goal Formulation: With patient Time For Goal Achievement: 11/19/19 Potential to Achieve  Goals: Good Progress towards PT goals: Progressing toward goals    Frequency    Min 3X/week      PT Plan Frequency needs to be updated;Discharge plan needs to be updated    Co-evaluation              AM-PAC PT "6 Clicks" Mobility   Outcome Measure  Help needed turning from your back to your side while in a flat bed without using bedrails?: A Little Help needed moving from lying on your back to sitting on the side of a flat bed without using bedrails?: A Little Help needed moving to and from a bed to a chair (including a wheelchair)?: A Little Help needed standing up from a chair using your arms (e.g., wheelchair or bedside chair)?: A Little Help needed to walk in hospital room?: A Little Help needed climbing 3-5 steps with a railing? : A Lot 6 Click Score: 17    End of Session Equipment Utilized During Treatment: Gait belt;Oxygen Activity Tolerance: Patient limited by fatigue Patient left: with call bell/phone within reach;in chair;with nursing/sitter in room;with chair alarm set Nurse Communication: Mobility status PT Visit Diagnosis: Unsteadiness on feet (R26.81);Muscle weakness (generalized) (M62.81);Adult, failure to thrive (R62.7)     Time: 9774-1423 PT Time Calculation (min) (ACUTE ONLY): 26 min  Charges:  $Gait Training: 23-37 mins                     Mickey Farber, PT, DPT   Acute Rehabilitation Department [phone]   Otho Bellows 11/11/2019, 11:35 AM

## 2019-11-11 NOTE — Progress Notes (Signed)
   Subjective: Pt seen at the bedside, about to start working with physical therapy. States the paracentesis yesterday went well, feels her abdominal distension is improved. No acute concerns at this time.  Objective:  Vital signs in last 24 hours: Vitals:   11/10/19 0743 11/10/19 1917 11/10/19 2055 11/11/19 0344  BP:   100/64 109/61  Pulse:   93 86  Resp:      Temp:   97.7 F (36.5 C) 98.4 F (36.9 C)  TempSrc:   Oral Oral  SpO2: 98% 97% 94% 99%  Weight:    82 kg  Height:       Physical Exam Vitals signs and nursing note reviewed.  Constitutional:      General: She is not in acute distress.    Appearance: She is ill-appearing (chronically).  Pulmonary:     Effort: Pulmonary effort is normal.     Comments: On 3L [place] Abdominal:     Comments: Distension improved s/p para  Musculoskeletal:     Comments: Bilateral lower extremity pitting edema somewhat improved from yesterday  Skin:    General: Skin is warm and dry.  Neurological:     Mental Status: She is alert.    Assessment/Plan:  Principal Problem:   AKI (acute kidney injury) ([place]) Active Problems:   Anemia in chronic kidney disease (CKD)   Acute diastolic congestive heart failure ([place])   Pulmonary arterial hypertension ([place])   Palliative care encounter   Decompensated liver disease ([place])   Hypervolemia   Abdominal ascites   Vanessa Williamson is a 63 yearold Fwith significant PMH of PAH,HFpEF,COPD, HTN,and CKDwho presented withdyspnea and new ascitesconsistent with fluid overload from multi-organ failure.    Hypervolemia [place] etiology multifactorial between cardiac, liver, and renal causes - Crholding 2.6-2.8 - net +220cc - does not appear to have included the paracentesis  - 5L removed via paracentesis on 11/18 - wt this morning 82 kg - currently on torsemide 100mg  PO daily - will defer diuretic management to nephrology   Nephrology consulted, appreciate their recommendations - keep torsemide -  assess if renal function improves enough to switch to furosemide/spironolactone - stopped metolazone on 11/17 to monitor GFR   GI consulted on 11/17 - would recommend diuresis with furosemide and spironolactone - periodic therapeutic paracentesis with albumin and 5L max outpatient with IR - diuretic/fluid management per nephro and cards   - palliative care to follow at discharge -continue to work with PT/OTduring this admission - CIR deemed pt not a candidate due to limited activity tolerance, lack of discharge support, and need for slower paced program   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 Pt and case management continuing to work on safe disposition options. This has been a challenge given patient's deconditioning and financial limitations.   [name], MD 11/11/2019, 6:43 AM Pager: [phone]

## 2019-11-11 NOTE — Progress Notes (Signed)
Inpatient Rehabilitation-Admissions Coordinator   Reviewed pt's case with PM&R MD, Dr. Naaman Plummer. At this time, pt is not a candidate for CIR due to limited activity tolerance, lack of discharge support, and need for slower paced therapy regimen.  AC will sign off.   Please call if questions.   Jhonnie Garner, OTR/L  Rehab Admissions Coordinator  [phone] 11/11/2019 9:45 AM

## 2019-11-12 LAB — BASIC METABOLIC PANEL
Anion gap: 11 (ref 5–15)
BUN: 114 mg/dL — ABNORMAL HIGH (ref 8–23)
CO2: 25 mmol/L (ref 22–32)
Calcium: 8 mg/dL — ABNORMAL LOW (ref 8.9–10.3)
Chloride: 102 mmol/L (ref 98–111)
Creatinine, Ser: 2.82 mg/dL — ABNORMAL HIGH (ref 0.44–1.00)
GFR calc Af Amer: 20 mL/min — ABNORMAL LOW (ref >60)
GFR calc non Af Amer: 17 mL/min — ABNORMAL LOW (ref >60)
Glucose, Bld: 146 mg/dL — ABNORMAL HIGH (ref 70–99)
Potassium: 3 mmol/L — ABNORMAL LOW (ref 3.5–5.1)
Sodium: 138 mmol/L (ref 135–145)

## 2019-11-12 LAB — GLUCOSE, CAPILLARY
Glucose-Capillary: 109 mg/dL — ABNORMAL HIGH (ref 70–99)
Glucose-Capillary: 109 mg/dL — ABNORMAL HIGH (ref 70–99)

## 2019-11-12 LAB — PROTIME-INR
INR: 1 (ref 0.8–1.2)
Prothrombin Time: 12.6 seconds (ref 11.4–15.2)

## 2019-11-12 MED ORDER — TORSEMIDE 20 MG PO TABS
40.0000 mg | ORAL_TABLET | Freq: Every day | ORAL | Status: DC
  Administered 2019-11-12 – 2019-11-14 (×3): 40 mg via ORAL
  Filled 2019-11-12 (×3): qty 2

## 2019-11-12 MED ORDER — POTASSIUM CHLORIDE CRYS ER 20 MEQ PO TBCR
40.0000 meq | EXTENDED_RELEASE_TABLET | Freq: Once | ORAL | Status: AC
  Administered 2019-11-12: 40 meq via ORAL
  Filled 2019-11-12: qty 2

## 2019-11-12 NOTE — Progress Notes (Signed)
Subjective: Pt seen at the bedside this morning. Continues to be tearful and frustrated with her situation and prognosis. Reiterating "I just want to be home." Discussed with her that she has advanced dysfunction of multiple organs and we have exhausted our treatment options for her. Pt understanding and states what is important to her now is getting her home in order to leave the hospital. She says "I am more prepared for my death then my friends." Discussed home hospice options which could keep her comfortable and at home in the last months of her life. Pt was agreeable to making those arrangements.   Objective:  Vital signs in last 24 hours: Vitals:   11/11/19 1918 11/11/19 2157 11/12/19 0200 11/12/19 0347  BP:  108/68  (!) 95/58  Pulse:  84  86  Resp:  19  18  Temp:  97.8 F (36.6 C)  98.2 F (36.8 C)  TempSrc:  Oral  Oral  SpO2: 99% 94%  95%  Weight:   81.6 kg   Height:       Physical Exam Vitals signs and nursing note reviewed.  Constitutional:      Appearance: She is ill-appearing.  Pulmonary:     Effort: Pulmonary effort is normal. No tachypnea.     Comments: On 3L [place] Abdominal:     General: There is distension.     Palpations: Abdomen is soft.  Musculoskeletal:     Comments: Bilateral lower extremity pitting edema to the thigh  Neurological:     Mental Status: She is alert.  Psychiatric:        Mood and Affect: Mood is depressed. Affect is tearful.    Assessment/Plan:  Principal Problem:   AKI (acute kidney injury) ([place]) Active Problems:   Anemia in chronic kidney disease (CKD)   Acute diastolic congestive heart failure ([place])   Pulmonary arterial hypertension ([place])   Palliative care encounter   Decompensated liver disease ([place])   Hypervolemia   Abdominal ascites   Ms. Steinkamp is a 63 yearold Fwith significant PMH of PAH,HFpEF,COPD, HTN,and CKDwho presented withdyspnea and new ascitesconsistent with fluid overload from multi-organ failure.     Hypervolemia secondary to end-stage organ dysfunction Driving etiology multifactorial between cardiac, liver, and renal causes Pt's long-term prognosis is poor, and her main goal at this time are being able to leave the hospital and go home. Pt expressed understanding that her condition is tenuous and many treatment options aren't an option for her due to detrimental effects on other organs. She expressed graditude that we have tried what we can so far, and is now interested in exploring home hospice. - social work consulted for home hospice referrals, appreciate their assistance with this case - pt will need therapeutic paracenteses likely once a week for comfort at discharge  - Crholding 2.6-2.8 -net +700cc - wt stable at around 82 kg - holding torsemide 100mg  PO daily today - will defer diuretic management to nephrology  Nephrology consulted, appreciate their recommendations - decrease torsemide to 40mg  daily - assess if renal function improves enough to switch to furosemide/spironolactone  GIconsulted on 11/17 - would recommend diuresis with furosemide and spironolactone - periodic therapeutic paracentesis with albumin and 5L max outpatient with IR    PAH- WHO group1 -continue sildenafil 20mg  tid   Diet - heart healthy Fluids - none DVT ppx- heparin 5,000 units subQ q8h CODE STATUS - DNR   Dispo: In light of pt's goals of care, she has been arranged with hospice  services at home per case management and will discharge tomorrow.    [name], MD 11/12/2019, 6:17 AM Pager: [phone]

## 2019-11-12 NOTE — TOC Transition Note (Signed)
Transition of Care [facility]) - CM/SW Discharge Note   Patient Details  Name: Vanessa Williamson MRN: [id number] Date of Birth: Feb 25, 1956  Transition of Care [facility]) CM/SW Contact:  Bethena Roys, RN Phone Number: 11/12/2019, 9:46 AM   Clinical Narrative:  CM received a call from the MD- asking for Hospice Services in the home. CM did offer Hospice Choice.-Patient chose [facility]. Referral sent to [facility]- patient appropriate for [facility]. Pt has DME 02 in the home via [employer]. Hospital bed ordered via [employer] from Hospice. Friends are working with patient to get additional assistance in the home. Patient will have to pay for additaional assistance and friends will make her aware. Patient will need PTAR for transport home. Please call Darlen Round @ [phone] when patient is ready to be transported. Plan for Sat d/c- No further needs from CM at this time.     Final next level of care: Home w Hospice Care Barriers to Discharge: No Barriers Identified   Patient Goals and CMS Choice Patient states their goals for this hospitalization and ongoing recovery are:: To be able to take care of myself and get stronger CMS Medicare.gov Compare Post Acute Care list provided to:: Patient Choice offered to / list presented to : Patient   Discharge Plan and Services In-house Referral: NA Discharge Planning Services: CM Consult Post Acute Care Choice: Hospice                    HH Arranged: RN [place]: [place] ([facility]) Date HH Agency Contacted: 11/12/19 Time [place]: [phone] Representative spoke with at [place]: [place] ([place]) Interventions     Readmission Risk Interventions Readmission Risk Prevention Plan 11/12/2019  Transportation Screening Complete  PCP or Specialist Appt within 3-5 Days (No Data)  [place] or [place] Complete  Social Work Consult for [place]  Planning/Counseling Complete  Palliative Care Screening Complete  Medication Review [employer]) Complete  Some recent data might be hidden

## 2019-11-12 NOTE — Progress Notes (Signed)
[facility] Progress Note    Assessment/ Plan:   1. AoCKD 3, follows with Dr. Moshe Cipro as outpatient, creatinine 1.2 08/2019.  Historically borderline nephrotic proteinuria but not present here.  Here was massively overloaded but does not appear to have primary issue being cardiac or hepatic, has diuresed effectively. Renal ultrasound medical renal disease, no acute structural issues.  Etiology driving this, unclear.  Serologies: ANCA, GBM, ANA, dsDNA negative. HBV, HCV, HIV neg, sFLC consisitent with low GFR. In absence of proteinuria and improving GFR no indication for renal biopsy.   UA clear.  Would decrease torsemide dose to 40 mg daily today- have ordered.  Cr stabilized at 2.7-2.8. 2. Question of right heart failure/cor pulmonale: on sildenafil.  3. COPD, on chronic oxygen 4. Mild hyperkalemia: resolved 5. Ascites, and cirrhosis- GI reconsulted, appreciate recommendations, had paracentesis with 5L off yesterday and albumin.  Watch Bps--> renal function is tenuous and don't want to have a recurrence of kidney injury.    6. Dispo: home with hospice.    Subjective:    Cr 2.7-2.8 is Cr.  Pt says she is hopeful for discharge soon.   Objective:   BP (!) 95/58 (BP Location: Left Arm)   Pulse 86   Temp 98.2 F (36.8 C) (Oral)   Resp 18   Ht 5\' 4"  (1.626 m)   Wt 81.6 kg   SpO2 99%   BMI 30.88 kg/m   Intake/Output Summary (Last 24 hours) at 11/12/2019 1227 Last data filed at 11/12/2019 0400 Gross per 24 hour  Intake 800 ml  Output 700 ml  Net 100 ml   Weight change: -0.41 kg  Physical Exam: Gen: sitting in bed, sleeping, arousable CVS: RRR no m/r/g Resp: clear bilaterally no c/w/r, on O2 Abd: soft, nondistended, NABS--> ascites improved Ext: 2+ LLE edema, 1+ RLE edema  Imaging: Ir Paracentesis  Result Date: 11/10/2019 INDICATION: History of chronic congestive heart failure and chronic renal insufficiency. Abdominal distention secondary to recurrent  ascites. Request for therapeutic paracentesis up to 5 L max. EXAM: ULTRASOUND GUIDED RIGHT LOWER QUADRANT PARACENTESIS MEDICATIONS: None. COMPLICATIONS: None immediate. PROCEDURE: Informed written consent was obtained from the patient after a discussion of the risks, benefits and alternatives to treatment. A timeout was performed prior to the initiation of the procedure. Initial ultrasound scanning demonstrates a large amount of ascites within the right lower abdominal quadrant. The right lower abdomen was prepped and draped in the usual sterile fashion. 1% lidocaine was used for local anesthesia. Following this, a 19 gauge, 7-cm, Yueh catheter was introduced. An ultrasound image was saved for documentation purposes. The paracentesis was performed. The catheter was removed and a dressing was applied. The patient tolerated the procedure well without immediate post procedural complication. FINDINGS: A total of approximately 5 L of clear yellow fluid was removed. IMPRESSION: Successful ultrasound-guided paracentesis yielding 5 liters of peritoneal fluid. Read by: Ascencion Dike PA-C Electronically Signed   By: Corrie Mckusick D.O.   On: 11/10/2019 15:38    Labs: BMET Recent Labs  Lab 11/06/19 0500 11/07/19 0428 11/08/19 0501 11/09/19 1300 11/10/19 0500 11/11/19 1015 11/12/19 0504  NA 139 138 139 136 139 138 138  K 3.9 3.7 3.8 3.6 3.8 3.5 3.0*  CL 107 106 105 103 104 106 102  CO2 23 22 23 23 23 23 25   GLUCOSE 97 101* 97 105* 99 142* 146*  BUN 100* 99* 103* 106* 107* 106* 114*  CREATININE 2.52* 2.59* 2.76* 2.73* 2.88* 2.77* 2.82*  CALCIUM 8.0* 8.1* 8.1* 8.1* 8.2* 8.0* 8.0*  PHOS  --   --   --   --  5.9*  --   --    CBC Recent Labs  Lab 11/07/19 0705 11/08/19 0501 11/09/19 1300 11/10/19 0500  WBC 3.0* 3.4* 3.9* 3.7*  NEUTROABS  --   --  2.7  --   HGB 8.8* 9.4* 9.3* 10.1*  HCT 25.7* 27.9* 28.0* 30.7*  MCV 101.6* 103.0* 104.1* 105.5*  PLT 156 175 199 213    Medications:    . budesonide   0.25 mg Nebulization BID  . Chlorhexidine Gluconate Cloth  6 each Topical Daily  . heparin  5,000 Units Subcutaneous Q8H  . lidocaine  1 application Urethral Once  . sildenafil  20 mg Oral TID  . sodium chloride flush  10-40 mL Intracatheter Q12H  . sodium chloride flush  3 mL Intravenous Q12H      Madelon Lips, MD 11/12/2019, 12:27 PM

## 2019-11-12 NOTE — Progress Notes (Signed)
Occupational Therapy Treatment Patient Details Name: Vanessa Williamson MRN: [id number] DOB: 1955/12/31 Today's Date: 11/12/2019    History of present illness 63 year old F with significant PMH of PAH, HFpEF, COPD, HTN, and CKD who presented with dyspnea, volume overload, and new ascites.   OT comments  Pt progressing towards OT goals this session, Pt is min guard assist for OOB transfers, able to complete sink level grooming with support leaning against the sink. Pt continues to fatigue quickly and requires seated rest breaks. OT updated discharge as per CM plan is for patient to discharge home with hospice. OT will continue to follow acutely and while we recommend HHOT to focus on quality of meaningful occupations in the home setting (in addition to caregiver education) we will defer to hospice service recommendations.    Follow Up Recommendations  Home health OT;Supervision/Assistance - 24 hour(likely home with hospice)    Equipment Recommendations  3 in 1 bedside commode    Recommendations for Other Services      Precautions / Restrictions Precautions Precautions: Fall Restrictions Weight Bearing Restrictions: No       Mobility Bed Mobility Overal bed mobility: Needs Assistance Bed Mobility: Supine to Sit     Supine to sit: Supervision;HOB elevated        Transfers Overall transfer level: Needs assistance Equipment used: Rolling walker (2 wheeled) Transfers: Sit to/from Stand Sit to Stand: From elevated surface;Min guard         General transfer comment: min guard for safety from EOB and recliner    Balance Overall balance assessment: Needs assistance Sitting-balance support: Feet supported Sitting balance-Leahy Scale: Good     Standing balance support: Bilateral upper extremity supported;During functional activity Standing balance-Leahy Scale: Fair                             ADL either performed or assessed with clinical judgement   ADL  Overall ADL's : Needs assistance/impaired     Grooming: Wash/dry hands;Min guard;Standing Grooming Details (indicate cue type and reason): leans against sink for support                 Toilet Transfer: Min guard;Minimal assistance;RW           Functional mobility during ADLs: Min guard;Minimal assistance General ADL Comments: seated rest breaks to perform activities     Vision       Perception     Praxis      Cognition Arousal/Alertness: Awake/alert Behavior During Therapy: Flat affect Overall Cognitive Status: Within Functional Limits for tasks assessed                                          Exercises     Shoulder Instructions       General Comments      Pertinent Vitals/ Pain       Pain Assessment: No/denies pain  Home Living                                          Prior Functioning/Environment              Frequency  Min 2X/week        Progress Toward Goals  OT Goals(current goals  can now be found in the care plan section)  Progress towards OT goals: Progressing toward goals  Acute Rehab OT Goals Patient Stated Goal: to get stronger OT Goal Formulation: With patient Time For Goal Achievement: 11/19/19 Potential to Achieve Goals: Good  Plan Discharge plan needs to be updated;Equipment recommendations need to be updated    Co-evaluation                 AM-PAC OT "6 Clicks" Daily Activity     Outcome Measure   Help from another person eating meals?: None Help from another person taking care of personal grooming?: A Little Help from another person toileting, which includes using toliet, bedpan, or urinal?: A Little Help from another person bathing (including washing, rinsing, drying)?: A Little Help from another person to put on and taking off regular upper body clothing?: A Little Help from another person to put on and taking off regular lower body clothing?: A Lot 6 Click Score:  18    End of Session Equipment Utilized During Treatment: Gait belt;Rolling walker  OT Visit Diagnosis: Unsteadiness on feet (R26.81);Repeated falls (R29.6)   Activity Tolerance Patient tolerated treatment well   Patient Left in bed;with call bell/phone within reach   Nurse Communication Mobility status        Time: 8242-3536 OT Time Calculation (min): 21 min  Charges: OT General Charges $OT Visit: 1 Visit OT Treatments $Self Care/Home Management : 8-22 mins  Hulda Humphrey OTR/L Acute Rehabilitation Services Pager: [phone] Office: [place] 11/12/2019, 5:43 PM

## 2019-11-12 NOTE — Progress Notes (Signed)
Physical Therapy Treatment Patient Details Name: Vanessa Williamson MRN: [id number] DOB: Jun 06, 1956 Today's Date: 11/12/2019    History of Present Illness 63 year old F with significant PMH of PAH, HFpEF, COPD, HTN, and CKD who presented with dyspnea, volume overload, and new ascites.    PT Comments    Patient seen for mobility progression. Pt requires min guard assist for OOB mobility using RW. Pt tolerated gait distance of 50 ft X 2 trials with seated break due to fatigue. Per CM pt is to d/c home with hospice. Continue to progress as tolerated.    Follow Up Recommendations  Home health PT;Supervision/Assistance - 24 hour(Per CM, pt unable to attend SNF or CIR due to lack of insurance coverage, plan for home with HHPT.)     Equipment Recommendations  Wheelchair (measurements PT);Wheelchair cushion (measurements PT);3in1 (PT);Rolling walker with 5" wheels    Recommendations for Other Services       Precautions / Restrictions Precautions Precautions: Fall Restrictions Weight Bearing Restrictions: No    Mobility  Bed Mobility               General bed mobility comments: pt sitting EOB upon arrival  Transfers Overall transfer level: Needs assistance Equipment used: Rolling walker (2 wheeled) Transfers: Sit to/from Stand Sit to Stand: From elevated surface;Min guard         General transfer comment: min guard for safety from EOB and recliner  Ambulation/Gait Ambulation/Gait assistance: Min guard Gait Distance (Feet): (50 ft X 2 trials with seated rest break) Assistive device: Rolling walker (2 wheeled) Gait Pattern/deviations: Decreased stride length;Wide base of support;Trunk flexed;Step-through pattern Gait velocity: decreased   General Gait Details: pt with SpO2 desat to 87% on 3L O2 via [place] and requires seated break due to fatigue; SpO2 recovered quickly with rest break   Stairs             Wheelchair Mobility    Modified Rankin (Stroke Patients  Only)       Balance Overall balance assessment: Needs assistance Sitting-balance support: Feet supported Sitting balance-Leahy Scale: Good     Standing balance support: Bilateral upper extremity supported;During functional activity Standing balance-Leahy Scale: Fair                              Cognition Arousal/Alertness: Awake/alert Behavior During Therapy: Flat affect Overall Cognitive Status: Within Functional Limits for tasks assessed                                        Exercises      General Comments        Pertinent Vitals/Pain Pain Assessment: No/denies pain    Home Living                      Prior Function            PT Goals (current goals can now be found in the care plan section) Acute Rehab PT Goals Patient Stated Goal: to get stronger Progress towards PT goals: Progressing toward goals    Frequency    Min 3X/week      PT Plan Current plan remains appropriate    Co-evaluation              AM-PAC PT "6 Clicks" Mobility   Outcome Measure  Help needed turning  from your back to your side while in a flat bed without using bedrails?: A Little Help needed moving from lying on your back to sitting on the side of a flat bed without using bedrails?: A Little Help needed moving to and from a bed to a chair (including a wheelchair)?: A Little Help needed standing up from a chair using your arms (e.g., wheelchair or bedside chair)?: A Little Help needed to walk in hospital room?: A Little Help needed climbing 3-5 steps with a railing? : A Lot 6 Click Score: 17    End of Session Equipment Utilized During Treatment: Gait belt;Oxygen Activity Tolerance: Patient tolerated treatment well Patient left: with call bell/phone within reach;in chair Nurse Communication: Mobility status PT Visit Diagnosis: Unsteadiness on feet (R26.81);Muscle weakness (generalized) (M62.81);Adult, failure to thrive (R62.7)      Time: 0902-0932 PT Time Calculation (min) (ACUTE ONLY): 30 min  Charges:  $Gait Training: 23-37 mins                     Earney Navy, PTA Acute Rehabilitation Services Pager: [phone] Office: [phone]     Darliss Cheney 11/12/2019, 11:44 AM

## 2019-11-12 NOTE — Progress Notes (Addendum)
[facility])  Referral received from [facility], for hospice services at home once discharged.  Awaiting approval from [name] MD.  Spoke with Ms. Chana Bode, friend and NOK, to confirm interest.  She states that the pts house is in "disaray" and that she has a "crew of friends" that she has to have come tonight to help ready the home for DME and the pt to return.  DME discussed, will order hospital bed, already has home O2  Will need PTAR to transport home tomorrow.  DME will be arranged to be delivered 11/21 after noon, per request of friends.  Thank you, Venia [place] RN, BSN, [facility] Liaison  [phone]   **approved for hospice services at home per Dr. Konrad Dolores with [facility]

## 2019-11-13 DIAGNOSIS — E861 Hypovolemia: Secondary | ICD-10-CM

## 2019-11-13 DIAGNOSIS — I5031 Acute diastolic (congestive) heart failure: Secondary | ICD-10-CM

## 2019-11-13 LAB — BASIC METABOLIC PANEL
Anion gap: 12 (ref 5–15)
BUN: 112 mg/dL — ABNORMAL HIGH (ref 8–23)
CO2: 23 mmol/L (ref 22–32)
Calcium: 7.8 mg/dL — ABNORMAL LOW (ref 8.9–10.3)
Chloride: 103 mmol/L (ref 98–111)
Creatinine, Ser: 2.4 mg/dL — ABNORMAL HIGH (ref 0.44–1.00)
GFR calc Af Amer: 24 mL/min — ABNORMAL LOW (ref >60)
GFR calc non Af Amer: 21 mL/min — ABNORMAL LOW (ref >60)
Glucose, Bld: 117 mg/dL — ABNORMAL HIGH (ref 70–99)
Potassium: 3.8 mmol/L (ref 3.5–5.1)
Sodium: 138 mmol/L (ref 135–145)

## 2019-11-13 LAB — GLUCOSE, CAPILLARY
Glucose-Capillary: 102 mg/dL — ABNORMAL HIGH (ref 70–99)
Glucose-Capillary: 140 mg/dL — ABNORMAL HIGH (ref 70–99)
Glucose-Capillary: 99 mg/dL (ref 70–99)

## 2019-11-13 LAB — PROTIME-INR
INR: 1 (ref 0.8–1.2)
Prothrombin Time: 13 seconds (ref 11.4–15.2)

## 2019-11-13 MED ORDER — TORSEMIDE 20 MG PO TABS: 40.0000 mg | ORAL_TABLET | Freq: Every day | ORAL | Status: AC

## 2019-11-13 NOTE — Discharge Instructions (Signed)
Ascites ° °Ascites is a collection of too much fluid in the abdomen. Ascites can range from mild to severe. If ascites is not treated, it can get worse. °What are the causes? °This condition may be caused by: °· A liver condition called cirrhosis. This is the most common cause of ascites. °· Long-term (chronic) or alcoholic hepatitis. °· Infection or inflammation in the abdomen. °· Cancer in the abdomen. °· Heart failure. °· Kidney disease. °· Inflammation of the pancreas. °· Clots in the veins of the liver. °What are the signs or symptoms? °Symptoms of this condition include: °· A feeling of fullness in the abdomen. This is common. °· An increase in the size of the abdomen or waist. °· Swelling in the legs. °· Swelling of the scrotum (in men). °· Difficulty breathing. °· Pain in the abdomen. °· Sudden weight gain. °If the condition is mild, you may not have symptoms. °How is this diagnosed? °This condition is diagnosed based on your medical history and a physical exam. Your health care provider may order imaging tests, such as an ultrasound or CT scan of your abdomen. °How is this treated? °Treatment for this condition depends on the cause of the ascites. It may include: °· Taking a pill to make you urinate. This is called a water pill (diuretic pill). °· Strictly reducing your salt (sodium) intake. Salt can cause extra fluid to be kept (retained) in the body, and this makes ascites worse. °· Having a procedure to remove fluid from your abdomen (paracentesis). °· Having a procedure that connects two of the major veins within your liver and relieves pressure on your liver. This is called a TIPS procedure (transjugular intrahepatic portosystemic shunt procedure). °· Placement of a drainage catheter (peritoneovenous shunt) to manage the extra fluid in the abdomen. °Ascites may go away or improve when the condition that caused it is treated. °Follow these instructions at home: °· Keep track of your weight. To do this,  weigh yourself at the same time every day and write down your weight. °· Keep track of how much you drink and any changes in how much or how often you urinate. °· Follow any instructions that your health care provider gives you about how much to drink. °· Try not to eat salty (high-sodium) foods. °· Take over-the-counter and prescription medicines only as told by your health care provider. °· Keep all follow-up visits as told by your health care provider. This is important. °· Report any changes in your health to your health care provider, especially if you develop new symptoms or your symptoms get worse. °Contact a health care provider if: °· You gain more than 3 lb (1.36 kg) in 3 days. °· Your waist size increases. °· You have new swelling in your legs. °· The swelling in your legs gets worse. °Get help right away if: °· You have a fever. °· You are confused. °· You have new or worsening breathing trouble. °· You have new or worsening pain in your abdomen. °· You have new or worsening swelling in the scrotum (in men). °Summary °· Ascites is a collection of too much fluid in the abdomen. °· Ascites may be caused by various conditions, such as cirrhosis, hepatitis, cancer, or congestive heart failure. °· Symptoms may include swelling of the abdomen and other areas due to extra fluid in the body. °· Treatments may involve dietary changes, medicines, or procedures. °This information is not intended to replace advice given to you by your health care   provider. Make sure you discuss any questions you have with your health care provider. °Document Released: 12/09/2005 Document Revised: 11/10/2018 Document Reviewed: 08/21/2017 °Elsevier Patient Education © 2020 [employer] ° °

## 2019-11-13 NOTE — Progress Notes (Signed)
RN spoke with Ms. Chana Bode, family friend of patient who is waiting at patient's home for DME/hospital bed to be delivered. Ms. Chana Bode stated that as of 47, home equipment had not been delivered to patient's home. Pt awaiting discharge with transportation by [facility]. Per case management, pt is unable to travel by [facility] unless all home equipment is delivered to home. MD aware of situation. Per MD and case management, pt will remain in hospital tonight due to lack of home health equipment.   Lenna Sciara, RN, BSN

## 2019-11-13 NOTE — Care Management (Addendum)
Patient's discharge is delayed due to delivery of hospital bed to the home.  [facility] ([facility]) uses [employer] [facility] and arranged yesterday for the bed to be delivered this afternoon.  The family has not received bed or estimate of time for bed to be delivered..  [employer] has communicated to me that the bed will be delivered this evening.  The family is aware and is willing to have patient transported home if before/around 7pm, but not later.  The patient's hospice admission appointment is scheduled for 10am tomorrow. Family requests that if the bed comes tonight, we will have patient transported prior to 10am.  Bedside staff is aware and will call [facility] to arrange transport this evening if bed delivered.

## 2019-11-13 NOTE — Progress Notes (Addendum)
[facility] Progress Note    Assessment/ Plan:   1. AoCKD 3, follows with Dr. Moshe Cipro as outpatient, creatinine 1.2 08/2019.  Historically borderline nephrotic proteinuria but not present here.  Here was massively overloaded but does not appear to have primary issue being cardiac or hepatic, has diuresed effectively. Renal ultrasound medical renal disease, no acute structural issues.  Etiology driving this, unclear.  Serologies: ANCA, GBM, ANA, dsDNA negative. HBV, HCV, HIV neg, sFLC consisitent with low GFR. In absence of proteinuria and improving GFR no indication for renal biopsy.   UA clear. On torsemide 40 mg daily.  Since she is going home with hospice followup with [place] is not imperative--> will have office our office call pt. 2. Question of right heart failure/cor pulmonale: on sildenafil.  3. COPD, on chronic oxygen 4. Mild hyperkalemia: resolved 5. Ascites, and cirrhosis- GI reconsulted, appreciate recommendations, had paracentesis with 5L off yesterday and albumin.  Watch Bps--> renal function is tenuous and don't want to have a recurrence of kidney injury.   Likely will not be able to add back aldactone. 6. Dispo: home with hospice.    Subjective:    Cr down to 2.4, azotemic but not uremic.  D/c with home hospice today.     Objective:   BP 106/65 (BP Location: Left Arm)   Pulse 85   Temp 98.1 F (36.7 C) (Oral)   Resp 19   Ht 5\' 4"  (1.626 m)   Wt 84.6 kg   SpO2 94%   BMI 32.03 kg/m   Intake/Output Summary (Last 24 hours) at 11/13/2019 1402 Last data filed at 11/12/2019 2201 Gross per 24 hour  Intake 115 ml  Output 500 ml  Net -385 ml   Weight change: 3.041 kg  Physical Exam: Gen: sitting in bed, sleeping, arousable CVS: RRR no m/r/g Resp: clear bilaterally no c/w/r, on O2 Abd: soft, nondistended, NABS--> ascites improved Ext: 2+ LLE edema, 1+ RLE edema  Imaging: No results found.  Labs: BMET Recent Labs  Lab 11/07/19 0428  11/08/19 0501 11/09/19 1300 11/10/19 0500 11/11/19 1015 11/12/19 0504 11/13/19 0249  NA 138 139 136 139 138 138 138  K 3.7 3.8 3.6 3.8 3.5 3.0* 3.8  CL 106 105 103 104 106 102 103  CO2 22 23 23 23 23 25 23   GLUCOSE 101* 97 105* 99 142* 146* 117*  BUN 99* 103* 106* 107* 106* 114* 112*  CREATININE 2.59* 2.76* 2.73* 2.88* 2.77* 2.82* 2.40*  CALCIUM 8.1* 8.1* 8.1* 8.2* 8.0* 8.0* 7.8*  PHOS  --   --   --  5.9*  --   --   --    CBC Recent Labs  Lab 11/07/19 0705 11/08/19 0501 11/09/19 1300 11/10/19 0500  WBC 3.0* 3.4* 3.9* 3.7*  NEUTROABS  --   --  2.7  --   HGB 8.8* 9.4* 9.3* 10.1*  HCT 25.7* 27.9* 28.0* 30.7*  MCV 101.6* 103.0* 104.1* 105.5*  PLT 156 175 199 213    Medications:    . budesonide  0.25 mg Nebulization BID  . Chlorhexidine Gluconate Cloth  6 each Topical Daily  . heparin  5,000 Units Subcutaneous Q8H  . lidocaine  1 application Urethral Once  . sildenafil  20 mg Oral TID  . sodium chloride flush  10-40 mL Intracatheter Q12H  . sodium chloride flush  3 mL Intravenous Q12H  . torsemide  40 mg Oral Daily      Madelon Lips, MD 11/13/2019, 2:02 PM

## 2019-11-13 NOTE — Discharge Summary (Signed)
Name: Vanessa Williamson MRN: [id number] DOB: Jun 27, 1956 63 y.o. PCP: Patient, No Pcp Per  Date of Admission: 10/29/2019  1:20 PM Date of Discharge: 11/13/2019 2:00 PM Attending Physician: Aldine Contes MD  Discharge Diagnosis: 1. Refractory Ascites 2. Acute on chronic kidney injury 3. Acute on chronic diastolic heart failure 4. Pulmonary Hypertension  Discharge Medications: Allergies as of 11/13/2019   No Known Allergies     Medication List    STOP taking these medications   amLODipine 5 MG tablet Commonly known as: NORVASC   aspirin 81 MG EC tablet   hydrALAZINE 25 MG tablet Commonly known as: APRESOLINE   metoprolol succinate 25 MG 24 hr tablet Commonly known as: TOPROL-XL   Opsumit 10 MG tablet Generic drug: macitentan     TAKE these medications   acetaminophen 500 MG tablet Commonly known as: TYLENOL Take 500-1,000 mg by mouth every 6 (six) hours as needed for mild pain or headache.   acetaminophen 325 MG tablet Commonly known as: TYLENOL Take 2 tablets (650 mg total) by mouth every 4 (four) hours as needed for headache or mild pain.   budesonide 0.25 MG/2ML nebulizer solution Commonly known as: PULMICORT Take 2 mLs (0.25 mg total) by nebulization 2 (two) times daily.   fluticasone 50 MCG/ACT nasal spray Commonly known as: FLONASE Place 1-2 sprays into both nostrils daily as needed for allergies or rhinitis.   Klor-Con M10 10 MEQ tablet Generic drug: potassium chloride Take 10 mEq by mouth 2 (two) times daily.   loratadine 10 MG tablet Commonly known as: CLARITIN Take 10 mg by mouth daily as needed for allergies or rhinitis.   meloxicam 7.5 MG tablet Commonly known as: MOBIC Take 7.5 mg by mouth daily as needed for pain.   OXYGEN Inhale 3 L/min into the lungs continuous.   sildenafil 20 MG tablet Commonly known as: REVATIO TAKE 1 TABLET BY MOUTH THREE TIMES A DAY   spironolactone 50 MG tablet Commonly known as: ALDACTONE Take 1 tablet  (50 mg total) by mouth daily.   torsemide 20 MG tablet Commonly known as: DEMADEX TAKE 1 TABLET (20 MG TOTAL) BY MOUTH 2 (TWO) TIMES DAILY WITH A MEAL. What changed: Another medication with the same name was added. Make sure you understand how and when to take each.   torsemide 20 MG tablet Commonly known as: DEMADEX Take 2 tablets (40 mg total) by mouth daily. Start taking on: November 14, 2019 What changed: You were already taking a medication with the same name, and this prescription was added. Make sure you understand how and when to take each.            Durable Medical Equipment  (From admission, onward)         Start     Ordered   11/13/19 1016  DME 3-in-1  Once     11/13/19 1018         Disposition and follow-up:   Vanessa Williamson was discharged from [facility] in Stable condition.  At the hospital follow up visit please address:  1.  She is being discharged with home hospice. Please ensure she has all the equipment and medications available for comfort and that she follows up with palliative care. Here are the current interventions for comfort:  - Home oxygen and sildanefil for dyspnea due to pulm htn  - Assess volume status and schedule therapeutic paracentesis for ascites  - Diuretic regimen (torsemide + spironolactone) to control volume  -  Wheelchair, Walker, 3 in 1 bedside commode for daily living  - lidocaine patch + voltaren gel for joint pain  2.  Labs / imaging needed at time of follow-up: N/A  3.  Pending labs/ test needing follow-up: N/A  Follow-up Appointments: Follow-up Information    [facility] Follow up.   Specialty: PALLIATIVE CARE Why: Registered Nurse- Hospice will call the patient to set up time for visit.  Contact information: [place]. Schedule an appointment as soon as possible for a visit in 1 week(s).    Contact information: [address] [place] [facility] Course by problem list: 1. Refractory Ascites:  Vanessa Williamson is a 63 yo F w/ PMH of Pulmonary hypertension, chronic respiratory failure, right sided heart failiure and chronic kidney disease stage 3 who presented with dyspnea. She was found to have new onset ascites and GI was consulted. Extensive work-up for liver disease was performed with no significant findings. She continued to have ongoing re-accumulation of ascitic fluid despite aggressive diuretic use. Her sources of ascites was thought to be due to multifactorial etiology including heart failure, renal failure, portal hypertension and hypoalbuminemia. Palliative team was consulted. After continued goals of care discussion, decision was made for her to be discharged with home hospice.  2. Acute on chronic kidney injury: Presented w/ acute on chronic kidney injury with baseline BUN 35 Creatinine 1.2 to BUN 94, Creatinine 3.12. Thought to be due to hypervolemia. Diuresed aggressively with IV furosemide with mild improvement. Nephrology was consulted and diuretic regimen changed to torsemide. Discharged at creatinine of 2.4 w/ recommendation to f/u with nephrology outpatient.   3. Acute on chronic diastolic heart failure: Presented w/ anasarca on exam. Noted to have new ascites and lower extremity edema. Echocardiogram performed showing preserved ejection fraction. Cardiology consulted who performed right heart cath confirming pulmonary hypertension without acute cor pulmonale. Recommended to continue diuresis.  4. Pulmonary Hypertension: Presented w/ acute on chronic respiratory failure with prior diagnosis of pulmonary hypertension group 1. Right heart cath performed confirming moderate severity pulmonary hypertension. Oxygen requirement down to home oxygen at 2L at time of discharge.  Discharge Vitals:   BP 113/75 (BP Location: Left Arm)    Pulse 83   Temp 98 F (36.7 C) (Oral)   Resp 19   Ht 5\' 4"  (1.626 m)   Wt 84.6 kg   SpO2 100%   BMI 32.03 kg/m   Pertinent Labs, Studies, and Procedures:    BMP Latest Ref Rng & Units 11/13/2019 11/12/2019 11/11/2019  Glucose 70 - 99 mg/dL 117(H) 146(H) 142(H)  BUN 8 - 23 mg/dL 112(H) 114(H) 106(H)  Creatinine 0.44 - 1.00 mg/dL 2.40(H) 2.82(H) 2.77(H)  BUN/Creat Ratio 12 - 28 - - -  Sodium 135 - 145 mmol/L 138 138 138  Potassium 3.5 - 5.1 mmol/L 3.8 3.0(L) 3.5  Chloride 98 - 111 mmol/L 103 102 106  CO2 22 - 32 mmol/L 23 25 23   Calcium 8.9 - 10.3 mg/dL 7.8(L) 8.0(L) 8.0(L)   CT ABD/PELVIS IMPRESSION: Large amount of abdominopelvic ascites and extensive diffuse anasarca.  due to the ascites there is limited visualization of abdominopelvic or in, however no definite gross abnormalities.  Aortic Atherosclerosis (ICD10-I70.0).  Streaky atelectasis seen at both lung bases.  ABDOMEN ULTRASOUND COMPLETE FINDINGS: Gallbladder: There is gallbladder wall thickening with the gallbladder  measuring approximately 5 mm. The sonographic Percell Miller sign is negative. No gallstones are identified.  Common bile duct: Diameter: 4 mm  Liver: The liver surface appears nodular consistent with cirrhosis. Portal vein is patent on color Doppler imaging with normal direction of blood flow towards the liver.  IVC: No abnormality visualized.  Pancreas: Visualized portion unremarkable.  Spleen: Size and appearance within normal limits.  Right Kidney: Length: 10.2 cm. The right kidney is echogenic. There is no hydronephrosis.  Left Kidney: Length: 9 cm. There is no hydronephrosis. The kidney is echogenic.  Abdominal aorta: There is no abdominal aortic aneurysm. Atherosclerotic changes are noted.  Other findings: There is a large volume of abdominal ascites. There is a right-sided pleural effusion.  IMPRESSION: 1. Moderate to large volume of abdominal ascites. 2. Cirrhosis  without evidence for a discrete hepatic mass. 3. Gallbladder wall thickening without evidence for cholelithiasis. Gallbladder wall thickening is nonspecific can be seen in the presence of ascites. 4. Right-sided pleural effusion. 5. Echogenic kidneys bilaterally which can be seen in patients with medical renal disease or other causes of renal insufficiency.  Transthoracic Echocardiogram 10/30/19 IMPRESSIONS  1. Left ventricular ejection fraction, by visual estimation, is 55 to 60%. The left ventricle has normal function. There is mildly increased left ventricular hypertrophy.  2. Global right ventricle has low normal systolic function.The right ventricular size is moderately enlarged. Mildly increased right ventricular wall thickness.  3. Left atrial size was normal.  4. Right atrial size was mildly dilated.  5. Compared to previous study in 06/2019, mitral and tricuspid regurgitation not well visualized. IVC not well visualized. Unable to assess pulmonary artery systolic pressure. However, enlarged right ventricle, and flattened interventricular septum and  bowing of interatrial septum to left suggest elevated right sided pressures.  Right heart catheterization 11/02/2019: RA 8/8, mean 6 mmHg, RA saturation 67%; RV 48/3, EDP 10 mmHg. PA 47/16, mean 30 mmHg.  PA saturation 66%.  PW 21/16, mean 14 mmHg.  Aortic saturation 99%.   QP/QS 0.97.  CI 3.6, CO 7.27.  PVR 176 dynes per second, SVR 1311 D/Sec.  Impression: The findings of right heart catheterization do not suggest acute cor pulmonale.  She is not in acute decompensated heart failure.  Her symptoms of ascites and leg edema.  Would correct her anemia, consider dialysis to get the fluid off.  We will discontinue dobutamine and also milrinone.  Discharge Instructions: Discharge Instructions    Call MD for:  difficulty breathing, headache or visual disturbances   Complete by: As directed    Call MD for:  persistant dizziness or  light-headedness   Complete by: As directed    Call MD for:  persistant nausea and vomiting   Complete by: As directed    Call MD for:  redness, tenderness, or signs of infection (pain, swelling, redness, odor or green/yellow discharge around incision site)   Complete by: As directed    Call MD for:  severe uncontrolled pain   Complete by: As directed    Call MD for:  temperature >100.4   Complete by: As directed    Diet - low sodium heart healthy   Complete by: As directed    Discharge instructions   Complete by: As directed    Dear Elenor Quinones  You came to [place] with shortness of breath. We have determined this was caused by heart, kidney and liver failure. Here are our recommendations for you at discharge:  Please make sure to follow up with  [place] clinic next Wednesday. Our clinic will call you to confirm Please stop your hydralazine, amlodipine, metoprolol and opsumit as well as your aspiring. Your blood pressure was noted to be low at the hospital.  Thank you for choosing [facility].   Increase activity slowly   Complete by: As directed      Signed: Mosetta Anis, MD 11/13/2019, 4:59 PM   Pager: [phone]

## 2019-11-13 NOTE — Progress Notes (Signed)
     Subjective:  Ms. Joyner is a 63 yearold Fwith significant PMH of PAH,HFpEF,COPD, HTN,and CKDadmit for new ascites on hospital day 15  Ms.Drotar was examined and evaluated at bedside this AM. She was observed resting comfortably in bed. She mentions worsening of her ascites. Discussed plan to discharge today with home hospice. She expressed understanding. She mentions that she does not have a PCP. Encouraged her to follow up with [facility] clinic for monitoring her ascites and possible therapeutic paracentesis. Ms.Forester expressed understanding.  Objective:  Vital signs in last 24 hours: Vitals:   11/12/19 0822 11/12/19 1411 11/12/19 2028 11/13/19 0532  BP:  (!) 93/59 101/60 106/65  Pulse:  82 87 85  Resp:   20 19  Temp:  98.1 F (36.7 C) 98.3 F (36.8 C) 98.1 F (36.7 C)  TempSrc:  Oral Oral Oral  SpO2: 99% 100% 96% 94%  Weight:    84.6 kg  Height:        Gen: NAD HEENT: NCAT head, hearing intact, EOMI, MMM Neck: supple, ROM intact, no JVD CV: RRR, S1, S2 normal, No rubs, no murmurs, no gallops Pulm: CTAB, No rales, no wheezes Abd: Distended abdomen w/ fluid shift Extm: ROM intact, Peripheral pulses intact, Bilateral 2+ pitting edema Skin: Dry, Warm, normal turgor Neuro: AAOx3  Assessment/Plan:  Principal Problem:   AKI (acute kidney injury) ([place]) Active Problems:   Anemia in chronic kidney disease (CKD)   Acute diastolic congestive heart failure (HCC)   Pulmonary arterial hypertension (HCC)   Goals of care, counseling/discussion   Decompensated liver disease (HCC)   Hypervolemia   Abdominal ascites  Ms. Chico is a 63 yearold Fwith significant PMH of PAH,HFpEF,COPD, HTN,and CKDwho presented withdyspnea and new ascitesconsistent with fluid overload from multi-organ failure.   Hypervolemia secondary to end-stage organ dysfunction Driving etiology multifactorial between cardiac, liver, and renal causes Poor prognosis. Goals of care discussion had  yesterday. Plan for discharge today w/ home hospice. - Discharge today - F/u with PCP  - F/u with IR for therapeutic paracentesis - Will d/c non-comfort based medications at discharge  Diet - heart healthy Fluids - none DVT ppx- heparin 5,000 units subQ q8h CODE STATUS - DNR  Dispo: Discharge today  Mosetta Anis, MD 11/13/2019, 10:19 AM Pager: [phone]

## 2019-11-14 LAB — GLUCOSE, CAPILLARY
Glucose-Capillary: 112 mg/dL — ABNORMAL HIGH (ref 70–99)
Glucose-Capillary: 124 mg/dL — ABNORMAL HIGH (ref 70–99)

## 2019-11-14 LAB — PROTIME-INR
INR: 0.9 (ref 0.8–1.2)
Prothrombin Time: 11.8 seconds (ref 11.4–15.2)

## 2019-11-14 NOTE — Care Management (Signed)
Patient's bed was delivered around 10pm last night.  [name] is available to receive patient.  Patient is scheduled to be transported by [facility] around 9am and will hopefully be home for 10am Hospice admission appointment.  Discussed with Harmon Pier from [facility] hospice.

## 2019-11-17 ENCOUNTER — Ambulatory Visit: Payer: No Typology Code available for payment source

## 2019-11-22 ENCOUNTER — Ambulatory Visit: Payer: No Typology Code available for payment source

## 2019-11-22 ENCOUNTER — Encounter: Payer: Self-pay | Admitting: General Practice

## 2019-11-23 ENCOUNTER — Other Ambulatory Visit: Payer: Self-pay

## 2019-11-23 ENCOUNTER — Telehealth: Payer: No Typology Code available for payment source | Admitting: Rheumatology

## 2019-11-24 ENCOUNTER — Other Ambulatory Visit: Payer: Self-pay | Admitting: Cardiology

## 2019-11-24 ENCOUNTER — Telehealth (INDEPENDENT_AMBULATORY_CARE_PROVIDER_SITE_OTHER): Payer: No Typology Code available for payment source | Admitting: Cardiology

## 2019-11-24 DIAGNOSIS — R188 Other ascites: Secondary | ICD-10-CM | POA: Diagnosis not present

## 2019-11-24 DIAGNOSIS — N1832 Chronic kidney disease, stage 3b: Secondary | ICD-10-CM | POA: Diagnosis not present

## 2019-11-24 DIAGNOSIS — K7469 Other cirrhosis of liver: Secondary | ICD-10-CM | POA: Diagnosis not present

## 2019-11-24 DIAGNOSIS — I5032 Chronic diastolic (congestive) heart failure: Secondary | ICD-10-CM

## 2019-11-24 DIAGNOSIS — D631 Anemia in chronic kidney disease: Secondary | ICD-10-CM

## 2019-11-24 NOTE — Telephone Encounter (Signed)
Patient now referred to LAD care and also hospice care.  I have reviewed her orders and also plan for her palliative care and send off on the orders.  Patient has severe COPD, cirrhosis of liver and stage IV chronic kidney disease, survival less than 6 months.  She also has moderate to severe pulmonary hypertension related to chronic diastolic heart failure, however this is stable.

## 2019-11-26 ENCOUNTER — Telehealth: Payer: Self-pay

## 2019-11-26 DIAGNOSIS — R188 Other ascites: Secondary | ICD-10-CM

## 2019-11-26 NOTE — Telephone Encounter (Signed)
Hospice called and ask that [facility] to have a Paracentesis her abdomen in 49.5 inches.

## 2019-11-26 NOTE — Telephone Encounter (Signed)
Orders for paracentesis placed    ICD-10-CM   1. Other ascites  R18.8 US Paracentesis

## 2019-11-30 ENCOUNTER — Telehealth: Payer: Self-pay

## 2019-11-30 ENCOUNTER — Other Ambulatory Visit: Payer: Self-pay | Admitting: Cardiology

## 2019-11-30 DIAGNOSIS — R188 Other ascites: Secondary | ICD-10-CM

## 2019-11-30 NOTE — Telephone Encounter (Signed)
Hospice called about the orders for her  Paracentesis

## 2019-12-01 ENCOUNTER — Telehealth (INDEPENDENT_AMBULATORY_CARE_PROVIDER_SITE_OTHER): Payer: No Typology Code available for payment source

## 2019-12-01 DIAGNOSIS — K7469 Other cirrhosis of liver: Secondary | ICD-10-CM

## 2019-12-01 DIAGNOSIS — I2781 Cor pulmonale (chronic): Secondary | ICD-10-CM

## 2019-12-01 DIAGNOSIS — I272 Pulmonary hypertension, unspecified: Secondary | ICD-10-CM

## 2019-12-01 NOTE — Telephone Encounter (Signed)
By discharge note, Opsumit was discontinued

## 2019-12-01 NOTE — Telephone Encounter (Signed)
Returned call to [facility] to advise that med has been dc'd.//ah

## 2019-12-01 NOTE — Telephone Encounter (Signed)
Vanessa Williamson called wanting to know if pt is still on opsumit. Do not see on current med list but you stated to continue in last ov note. Please advise.//ah

## 2019-12-03 ENCOUNTER — Other Ambulatory Visit: Payer: Self-pay | Admitting: Cardiology

## 2019-12-06 ENCOUNTER — Ambulatory Visit (HOSPITAL_COMMUNITY)
Admission: RE | Admit: 2019-12-06 | Discharge: 2019-12-06 | Disposition: A | Discharge status: Home / Self Care | Payer: No Typology Code available for payment source | Source: Ambulatory Visit | Attending: Cardiology | Admitting: Cardiology

## 2019-12-06 ENCOUNTER — Other Ambulatory Visit: Payer: Self-pay

## 2019-12-06 DIAGNOSIS — R188 Other ascites: Secondary | ICD-10-CM | POA: Diagnosis not present

## 2019-12-06 HISTORY — PX: IR PARACENTESIS: IMG2679

## 2019-12-06 MED ORDER — LIDOCAINE HCL 1 % IJ SOLN
INTRAMUSCULAR | Status: AC
  Filled 2019-12-06: qty 20

## 2019-12-06 NOTE — Procedures (Signed)
PROCEDURE SUMMARY:  Successful image-guided paracentesis from the left lower abdomen.  Yielded 10.5 liters of clear, light yellow fluid.  No immediate complications.  EBL: zero Patient tolerated well.   Specimen was not sent for labs.  Please see imaging section of Epic for full dictation.  Joaquim Nam PA-C 12/06/2019 1:25 PM

## 2019-12-13 ENCOUNTER — Ambulatory Visit: Payer: No Typology Code available for payment source | Admitting: Rheumatology

## 2019-12-15 ENCOUNTER — Telehealth (INDEPENDENT_AMBULATORY_CARE_PROVIDER_SITE_OTHER): Payer: No Typology Code available for payment source

## 2019-12-15 DIAGNOSIS — K7469 Other cirrhosis of liver: Secondary | ICD-10-CM

## 2019-12-15 DIAGNOSIS — I272 Pulmonary hypertension, unspecified: Secondary | ICD-10-CM

## 2019-12-15 DIAGNOSIS — I5032 Chronic diastolic (congestive) heart failure: Secondary | ICD-10-CM

## 2019-12-15 DIAGNOSIS — I2781 Cor pulmonale (chronic): Secondary | ICD-10-CM

## 2019-12-15 DIAGNOSIS — R188 Other ascites: Secondary | ICD-10-CM

## 2019-12-21 NOTE — Telephone Encounter (Signed)
I have reviewed the assessment and recommendation and home health  management plan and treatment plan as recommended by palliative care.  Orders signed and returned.    ICD-10-CM   1. Cor pulmonale, chronic (HCC)  I27.81   2. Pulmonary hypertension (HCC)  I27.20   3. Decompensated liver disease ([place])  K74.69     Adrian Prows, MD, [facility] 12/21/2019, 5:06 PM [facility]. [place] Office: [phone]

## 2019-12-22 NOTE — Telephone Encounter (Signed)
I have evaluated the orders for home health plans of care dated 11/14/2019. Agree with assessment and plans.    ICD-10-CM   1. Decompensated liver disease ([place])  K74.69   2. Cor pulmonale, chronic (HCC)  I27.81   3. Pulmonary hypertension ([place])  I27.20     Adrian Prows, MD, [facility] 12/22/2019, 11:37 PM [facility]. [place] Pager: [phone] Office: [phone]

## 2020-01-14 ENCOUNTER — Other Ambulatory Visit: Payer: Self-pay | Admitting: Cardiology

## 2020-01-14 ENCOUNTER — Other Ambulatory Visit (HOSPITAL_COMMUNITY): Payer: Self-pay | Admitting: Cardiology

## 2020-01-14 DIAGNOSIS — R188 Other ascites: Secondary | ICD-10-CM

## 2020-01-17 ENCOUNTER — Ambulatory Visit (HOSPITAL_COMMUNITY)
Admission: RE | Admit: 2020-01-17 | Discharge: 2020-01-17 | Disposition: A | Discharge status: Home / Self Care | Payer: No Typology Code available for payment source | Source: Ambulatory Visit | Attending: Cardiology | Admitting: Cardiology

## 2020-01-17 ENCOUNTER — Other Ambulatory Visit: Payer: Self-pay

## 2020-01-17 DIAGNOSIS — R188 Other ascites: Secondary | ICD-10-CM | POA: Diagnosis present

## 2020-01-17 HISTORY — PX: IR PARACENTESIS: IMG2679

## 2020-01-17 MED ORDER — ALBUMIN HUMAN 25 % IV SOLN
50.0000 g | Freq: Once | INTRAVENOUS | Status: AC
  Administered 2020-01-17: 14:00:00 50 g via INTRAVENOUS

## 2020-01-17 MED ORDER — LIDOCAINE HCL (PF) 1 % IJ SOLN
INTRAMUSCULAR | Status: DC | PRN
  Administered 2020-01-17: 5 mL

## 2020-01-17 MED ORDER — ALBUMIN HUMAN 25 % IV SOLN
INTRAVENOUS | Status: AC
  Filled 2020-01-17: qty 200

## 2020-01-17 MED ORDER — LIDOCAINE HCL 1 % IJ SOLN
INTRAMUSCULAR | Status: AC
  Filled 2020-01-17: qty 20

## 2020-01-17 NOTE — Procedures (Signed)
PROCEDURE SUMMARY:  Successful US guided paracentesis from right lateral abdomen.  Yielded 11.5 liters of yellow fluid.  No immediate complications.  Pt tolerated well.   Specimen was not sent for labs.  EBL < 3mL  Patient received albumin per protocol.  SBP decreased from 128 prior to procedure to 94/69.   Docia Barrier PA-C 01/17/2020 2:30 PM

## 2020-01-21 ENCOUNTER — Other Ambulatory Visit: Payer: Self-pay

## 2020-02-02 ENCOUNTER — Other Ambulatory Visit: Payer: Self-pay

## 2020-02-02 ENCOUNTER — Telehealth: Payer: Self-pay

## 2020-02-02 DIAGNOSIS — I2781 Cor pulmonale (chronic): Secondary | ICD-10-CM

## 2020-02-02 DIAGNOSIS — I5031 Acute diastolic (congestive) heart failure: Secondary | ICD-10-CM

## 2020-02-02 DIAGNOSIS — N1832 Chronic kidney disease, stage 3b: Secondary | ICD-10-CM

## 2020-02-02 DIAGNOSIS — K7469 Other cirrhosis of liver: Secondary | ICD-10-CM

## 2020-02-02 DIAGNOSIS — N183 Chronic kidney disease, stage 3 unspecified: Secondary | ICD-10-CM

## 2020-02-02 DIAGNOSIS — R188 Other ascites: Secondary | ICD-10-CM

## 2020-02-02 MED ORDER — SILDENAFIL CITRATE 20 MG PO TABS: 20.0000 mg | ORAL_TABLET | Freq: Three times a day (TID) | ORAL | 2 refills | Status: DC

## 2020-02-02 NOTE — Telephone Encounter (Signed)
Santiago Glad, nurse with hospice called wanting verball orders to continue hospice services. Please advise if ok.//ah

## 2020-02-02 NOTE — Telephone Encounter (Signed)
I have placed the orders, can  you fax it to them, did not have fax number, you can do it through [employer]

## 2020-02-20 NOTE — Telephone Encounter (Signed)
I have reviewed home health plans of care and approved them from 02/12/2020 to 05/11/20..  Cor pulmonale, chronic ([place]) - Plan: Ambulatory referral to Hospice  Decompensated liver disease ([place]) - Plan: Ambulatory referral to Hospice  Anemia in stage 3b chronic kidney disease - Plan: Ambulatory referral to Hospice  Stage 3 chronic kidney disease, unspecified whether stage 3a or 3b CKD - Plan: Ambulatory referral to Hospice    Adrian Prows, MD, [facility] 02/20/2020, 10:05 AM [facility]. [place] Office: [phone]

## 2020-03-10 ENCOUNTER — Telehealth: Payer: Self-pay

## 2020-03-10 DIAGNOSIS — R188 Other ascites: Secondary | ICD-10-CM

## 2020-03-10 DIAGNOSIS — K7469 Other cirrhosis of liver: Secondary | ICD-10-CM

## 2020-03-10 NOTE — Telephone Encounter (Signed)
Santiago Glad @ [facility] hospice need an order placed for an ultrasound guided paracentesis to be done next week [facility].

## 2020-03-10 NOTE — Telephone Encounter (Signed)
Done

## 2020-03-16 ENCOUNTER — Other Ambulatory Visit: Payer: Self-pay

## 2020-03-16 ENCOUNTER — Ambulatory Visit (HOSPITAL_COMMUNITY)
Admission: RE | Admit: 2020-03-16 | Discharge: 2020-03-16 | Disposition: A | Discharge status: Home / Self Care | Payer: No Typology Code available for payment source | Source: Ambulatory Visit | Attending: Cardiology | Admitting: Cardiology

## 2020-03-16 DIAGNOSIS — R188 Other ascites: Secondary | ICD-10-CM | POA: Diagnosis not present

## 2020-03-16 DIAGNOSIS — K7469 Other cirrhosis of liver: Secondary | ICD-10-CM

## 2020-03-16 HISTORY — PX: IR PARACENTESIS: IMG2679

## 2020-03-16 MED ORDER — LIDOCAINE HCL 1 % IJ SOLN
INTRAMUSCULAR | Status: AC
  Filled 2020-03-16: qty 20

## 2020-03-16 MED ORDER — LIDOCAINE HCL (PF) 1 % IJ SOLN
INTRAMUSCULAR | Status: AC | PRN
  Administered 2020-03-16: 10 mL

## 2020-03-16 NOTE — Procedures (Signed)
PROCEDURE SUMMARY:  Successful US guided paracentesis from RLQ.  Yielded 10.5 L of clear yellow fluid.  No immediate complications.  Pt tolerated well.   Specimen was not sent for labs.  EBL < 25mL  Ascencion Dike PA-C 03/16/2020 2:03 PM

## 2020-05-02 ENCOUNTER — Telehealth: Payer: Self-pay | Admitting: Physician Assistant

## 2020-05-02 ENCOUNTER — Other Ambulatory Visit: Payer: Self-pay

## 2020-05-02 MED ORDER — SPIRONOLACTONE 50 MG PO TABS: 50.0000 mg | ORAL_TABLET | Freq: Every day | ORAL | 2 refills | Status: DC

## 2020-05-02 MED ORDER — POTASSIUM CHLORIDE CRYS ER 10 MEQ PO TBCR: 10.0000 meq | EXTENDED_RELEASE_TABLET | Freq: Two times a day (BID) | ORAL | 3 refills | Status: DC

## 2020-05-02 MED ORDER — TORSEMIDE 20 MG PO TABS: 20.0000 mg | ORAL_TABLET | Freq: Two times a day (BID) | ORAL | 3 refills | Status: DC

## 2020-05-02 NOTE — Telephone Encounter (Signed)
I connected by phone with Vanessa Williamson and/or patient's caregiver on 05/02/2020 at 12:42 PM to discuss the potential vaccination through our Homebound vaccination initiative.   Prevaccination Checklist for COVID-19 Vaccines  1.  Are you feeling sick today? no  2.  Have you ever received a dose of a COVID-19 vaccine?  no      If yes, which one? None   3.  Have you ever had an allergic reaction: (This would include a severe reaction [ e.g., anaphylaxis] that required treatment with epinephrine or EpiPen or that caused you to go to the hospital.  It would also include an allergic reaction that occurred within 4 hours that caused hives, swelling, or respiratory distress, including wheezing.) A.  A previous dose of COVID-19 vaccine. no  B.  A vaccine or injectable therapy that contains multiple components, one of which is a COVID-19 vaccine component, but it is not known which component elicited the immediate reaction. no  C.  Are you allergic to polyethylene glycol? no   4.  Have you ever had an allergic reaction to another vaccine (other than COVID-19 vaccine) or an injectable medication? (This would include a severe reaction [ e.g., anaphylaxis] that required treatment with epinephrine or EpiPen or that caused you to go to the hospital.  It would also include an allergic reaction that occurred within 4 hours that caused hives, swelling, or respiratory distress, including wheezing.)  no   5.  Have you ever had a severe allergic reaction (e.g., anaphylaxis) to something other than a component of the COVID-19 vaccine, or any vaccine or injectable medication?  This would include food, pet, venom, environmental, or oral medication allergies.  no   6.  Have you received any vaccine in the last 14 days? no   7.  Have you ever had a positive test for COVID-19 or has a doctor ever told you that you had COVID-19?  no   8.  Have you received passive antibody therapy (monoclonal antibodies or convalescent  serum) as a treatment for COVID-19? no   9.  Do you have a weakened immune system caused by something such as HIV infection or cancer or do you take immunosuppressive drugs or therapies?  no   10.  Do you have a bleeding disorder or are you taking a blood thinner? no   11.  Are you pregnant or breast-feeding? no   12.  Do you have dermal fillers? no   __________________   This patient is a 64 y.o. female that meets the FDA criteria to receive homebound vaccination. Patient or parent/caregiver understands they have the option to accept or refuse homebound vaccination.  Patient passed the pre-screening checklist and would like to proceed with homebound vaccination.  Based on questionnaire above, I recommend the patient be observed for 15 minutes.  There are an estimated 1 other household members/caregivers who are also interested in receiving the vaccine.    I will send the patient's information to our scheduling team who will reach out to schedule the patient and potential caregiver/family members for homebound vaccination.    [name] 05/02/2020 12:42 PM

## 2020-05-11 ENCOUNTER — Ambulatory Visit: Payer: No Typology Code available for payment source | Attending: Critical Care Medicine

## 2020-05-11 ENCOUNTER — Telehealth: Payer: Self-pay

## 2020-05-11 DIAGNOSIS — Z23 Encounter for immunization: Secondary | ICD-10-CM

## 2020-05-11 IMAGING — NM NM PULMONARY PERF PARTICULATE
8 series · 8 of 8 positions shown · non-contrast
Comparison: High-resolution CT chest dated 09/13/2019

CLINICAL DATA: Newly diagnosed pulmonary hypertension, evaluate for
chronic thromboembolism

EXAM:
NUCLEAR MEDICINE PERFUSION LUNG SCAN
TECHNIQUE: Perfusion images were obtained in multiple projections after
intravenous injection of radiopharmaceutical.
Ventilation scans intentionally deferred if perfusion scan and chest
x-ray adequate for interpretation during COVID 19 epidemic.
RADIOPHARMACEUTICALS:  1.6 mCi Rc-RRm MAA IV

[Series 1: ant/post perf · 4.14mm/px · 1 of 1 slices shown (1 of 2)]
[im 1/1]
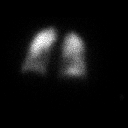

[Series 1: ant/post perf · 4.14mm/px · 1 of 1 slices shown (2 of 2)]
[im 1/1]
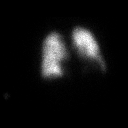

[Series 2: lao/rpo perf · 4.14mm/px · 1 of 1 slices shown (1 of 2)]
[im 1/1]
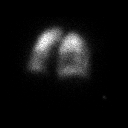

[Series 2: lao/rpo perf · 4.14mm/px · 1 of 1 slices shown (2 of 2)]
[im 1/1]
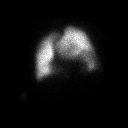

[Series 3: lpo/rao perf · 4.14mm/px · 1 of 1 slices shown (1 of 2)]
[im 1/1]
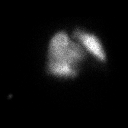

[Series 3: lpo/rao perf · 4.14mm/px · 1 of 1 slices shown (2 of 2)]
[im 1/1]
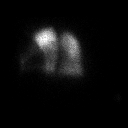

[Series 4: lt lat/rt lat perf · 4.14mm/px · 1 of 1 slices shown (1 of 2)]
[im 1/1]
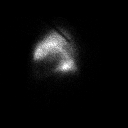

[Series 4: lt lat/rt lat perf · 4.14mm/px · 1 of 1 slices shown (2 of 2)]
[im 1/1]
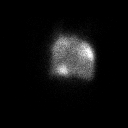

[8 of 8 positions shown; findings below may reference images not displayed]

FINDINGS: Mildly heterogeneous perfusion, without wedge-shaped defects
suggestive of pulmonary embolism.

Ventilation imaging was not performed.

Corresponding chest radiographs suggest mild interstitial edema with
right lower lobe atelectasis and small right pleural effusion.
IMPRESSION: No convincing findings to suggest pulmonary embolism.

## 2020-05-11 MED ORDER — SILDENAFIL CITRATE 20 MG PO TABS: 20.0000 mg | ORAL_TABLET | Freq: Three times a day (TID) | ORAL | 2 refills | Status: DC

## 2020-05-11 NOTE — Progress Notes (Unsigned)
   Covid-19 Vaccination Clinic  Name:  MONSERATH NEFF    MRN: [id number] DOB: [birth year]  05/11/2020  Ms. Yingst was observed post Covid-19 immunization for 15 minutes without incident. She was provided with Vaccine Information Sheet and instruction to access the V-Safe system.   Ms. Kalt was instructed to call 911 with any severe reactions post vaccine: Marland Kitchen Difficulty breathing  . Swelling of face and throat  . A fast heartbeat  . A bad rash all over body  . Dizziness and weakness    *** Covid vaccine administration is NOT RECORDED.  Must document administration and refresh note before signing ***

## 2020-05-18 ENCOUNTER — Other Ambulatory Visit: Payer: Self-pay | Admitting: Cardiology

## 2020-05-18 ENCOUNTER — Telehealth: Payer: Self-pay | Admitting: Cardiology

## 2020-05-18 DIAGNOSIS — I2781 Cor pulmonale (chronic): Secondary | ICD-10-CM

## 2020-05-18 DIAGNOSIS — R188 Other ascites: Secondary | ICD-10-CM

## 2020-05-18 DIAGNOSIS — K7469 Other cirrhosis of liver: Secondary | ICD-10-CM

## 2020-05-18 NOTE — Telephone Encounter (Signed)
Home health plans of care reviewed and signed off.  Adrian Prows, MD, [facility] 05/18/2020, 10:03 PM [facility] [place] Office: [phone]

## 2020-06-08 ENCOUNTER — Ambulatory Visit: Payer: No Typology Code available for payment source | Attending: Critical Care Medicine

## 2020-06-08 DIAGNOSIS — Z23 Encounter for immunization: Secondary | ICD-10-CM

## 2020-06-08 NOTE — Progress Notes (Signed)
   Covid-19 Vaccination Clinic  Name:  JAMEELA MICHNA    MRN: [id number] DOB: 10/05/1956  06/08/2020  Ms. Seng was observed post Covid-19 immunization for 15 minutes without incident. She was provided with Vaccine Information Sheet and instruction to access the V-Safe system.   Ms. Mcreynolds was instructed to call 911 with any severe reactions post vaccine: Marland Kitchen Difficulty breathing  . Swelling of face and throat  . A fast heartbeat  . A bad rash all over body  . Dizziness and weakness   Immunizations Administered    Name Date Dose VIS Date Route   Moderna COVID-19 Vaccine 06/08/2020  2:10 PM 0.5 mL 11/2019 Intramuscular   Manufacturer: Levan Hurst   Lot: [id number]   [place]: [id number]

## 2020-08-01 IMAGING — US IR PARACENTESIS
1 series · 4 of 4 positions shown · non-contrast
Comparison: none

INDICATION: Patient with history of congestive heart failure, renal
insufficiency and recurrent ascites. Request for therapeutic
paracentesis with no maximum.

[Series 1: (id) · 4 of 4 slices shown]
[im 1/4]
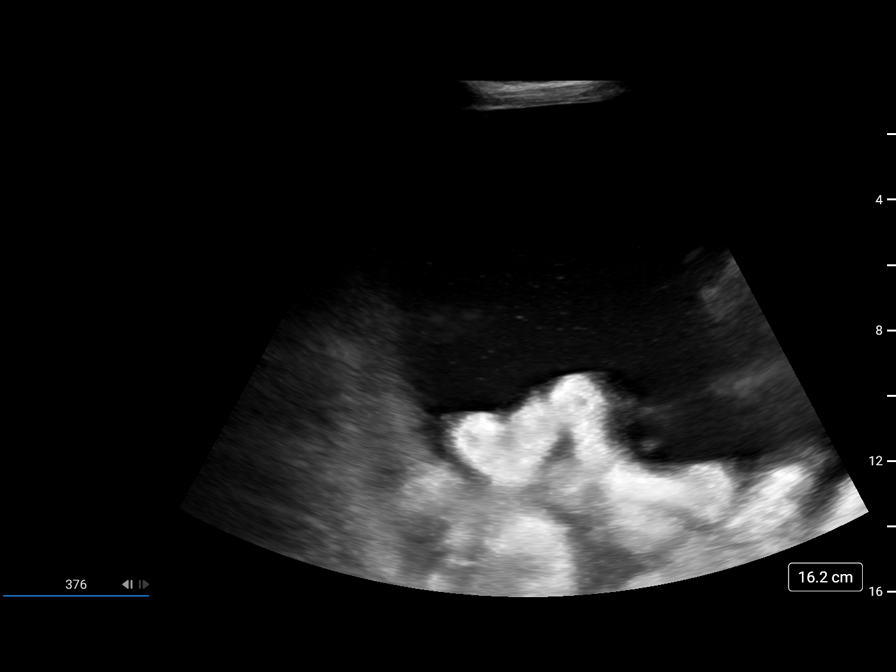
[im 2/4]
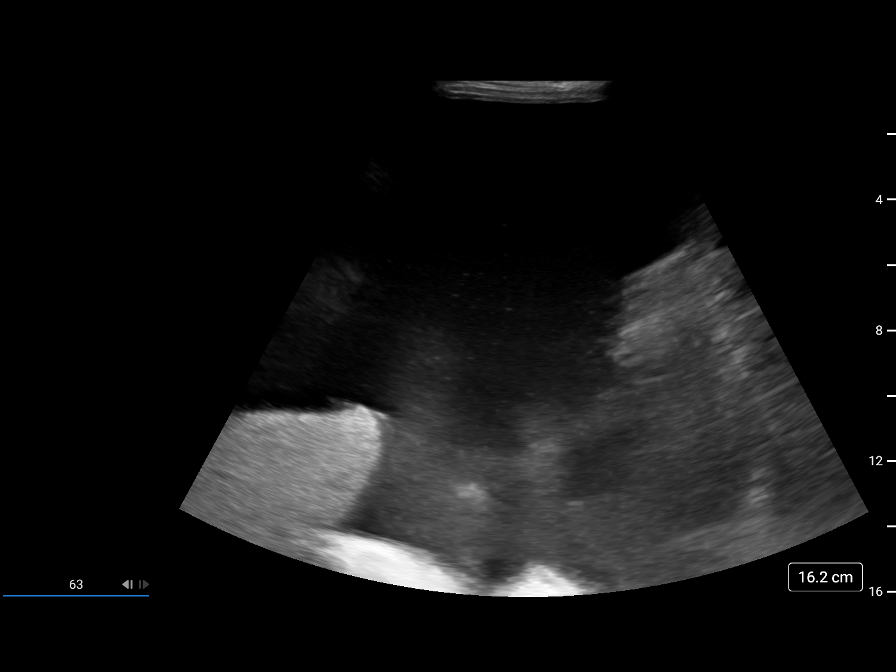
[im 3/4]
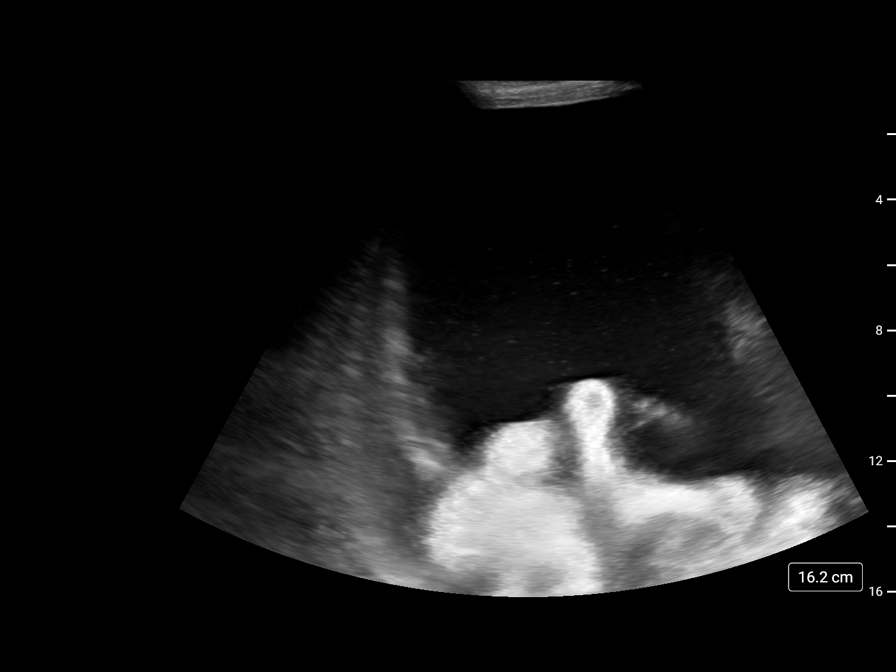
[im 4/4]
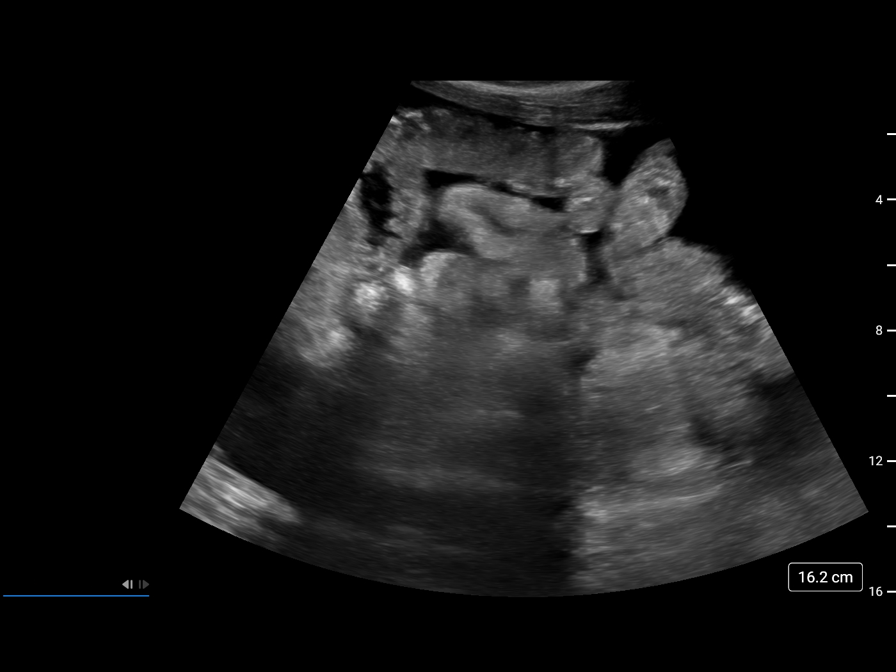

[4 of 4 positions shown; findings below may reference images not displayed]

EXAM:
ULTRASOUND GUIDED THERAPEUTIC PARACENTESIS

MEDICATIONS:
10 mL 1% lidocaine

COMPLICATIONS:
None immediate.

PROCEDURE:
Informed written consent was obtained from the patient after a
discussion of the risks, benefits and alternatives to treatment. A
timeout was performed prior to the initiation of the procedure.

Initial ultrasound scanning demonstrates a large amount of ascites
within the left lower abdominal quadrant. The left lower abdomen was
prepped and draped in the usual sterile fashion. 1% lidocaine was
used for local anesthesia.

Following this, a 19 gauge, 7-cm, Yueh catheter was introduced. An
ultrasound image was saved for documentation purposes. The
paracentesis was performed. The catheter was removed and a dressing
was applied. The patient tolerated the procedure well without
immediate post procedural complication.
Patient received post-procedure intravenous albumin; see nursing
notes for details.
FINDINGS: A total of approximately 10.5 L of clear, light yellow fluid was
removed.
IMPRESSION: Successful ultrasound-guided paracentesis yielding 10.5 liters of
peritoneal fluid.

Read by Rim, Thao

## 2020-08-05 ENCOUNTER — Other Ambulatory Visit: Payer: Self-pay | Admitting: Cardiology

## 2020-09-07 ENCOUNTER — Other Ambulatory Visit: Payer: Self-pay | Admitting: Cardiology

## 2020-09-12 IMAGING — US IR PARACENTESIS
1 series · 2 of 2 positions shown · non-contrast
Comparison: none

INDICATION: Patient with history of congestive heart failure, renal
insufficiency, recurrent ascites. Request is made for therapeutic
paracentesis with removal of as much fluid as tolerated in hospice
patient.

[Series 1: (id) · 2 of 2 slices shown]
[im 1/2]
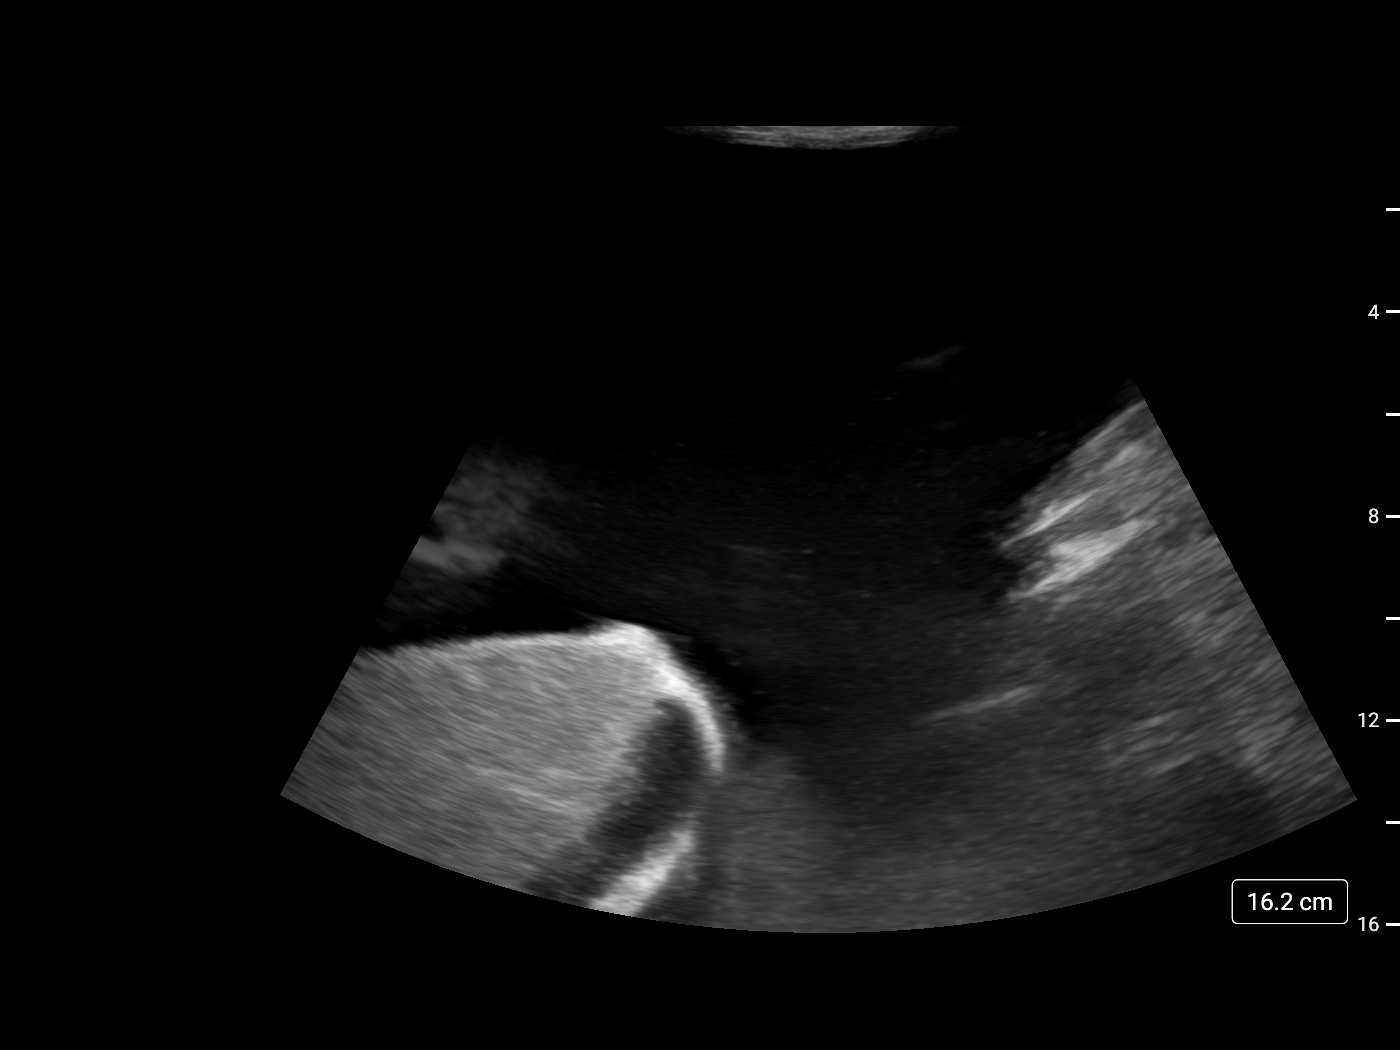
[im 2/2]
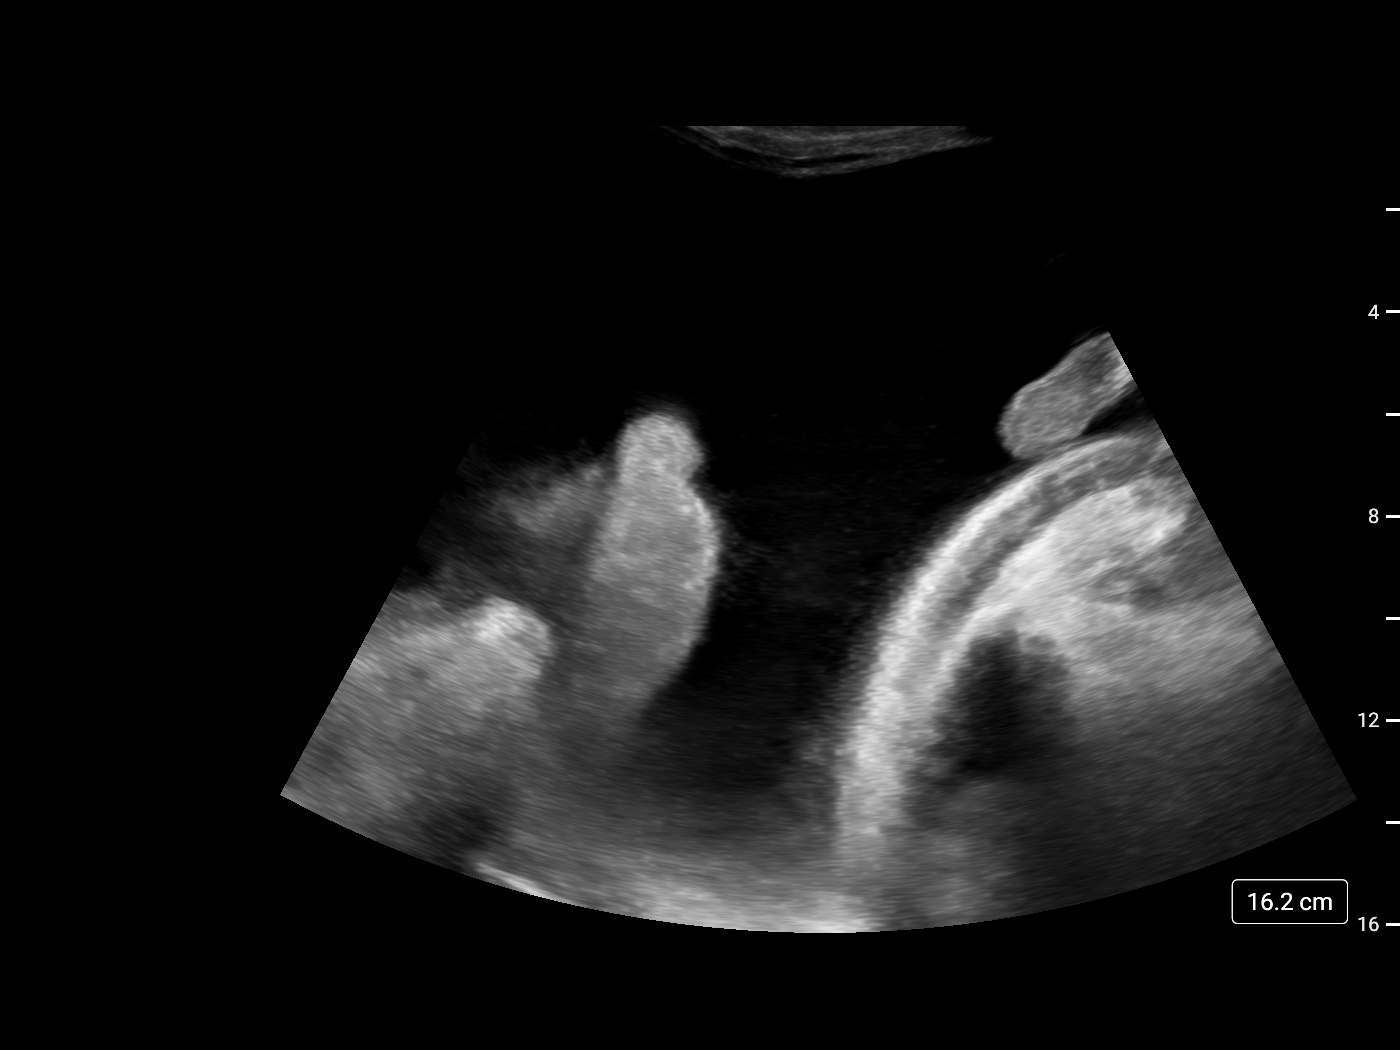

[2 of 2 positions shown; findings below may reference images not displayed]

EXAM:
ULTRASOUND GUIDED THERAPEUTIC PARACENTESIS

MEDICATIONS:
10 mL 1% lidocaine

COMPLICATIONS:
None immediate.

PROCEDURE:
Informed written consent was obtained from the patient after a
discussion of the risks, benefits and alternatives to treatment. A
timeout was performed prior to the initiation of the procedure.

Initial ultrasound scanning demonstrates a large amount of ascites
within the right lower abdominal quadrant. The right lower abdomen
was prepped and draped in the usual sterile fashion. 1% lidocaine
was used for local anesthesia.

Following this, a 19 gauge, 7-cm, Yueh catheter was introduced. An
ultrasound image was saved for documentation purposes. The
paracentesis was performed. The catheter was removed and a dressing
was applied. The patient tolerated the procedure well without
immediate post procedural complication.
FINDINGS: A total of approximately 11.5 liters of yellow fluid was removed.
Patient received albumin per protocol with procedure today.
IMPRESSION: Successful ultrasound-guided paracentesis yielding 11.5 liters of
peritoneal fluid.

## 2020-09-18 ENCOUNTER — Telehealth: Payer: Self-pay | Admitting: Cardiology

## 2020-09-18 DIAGNOSIS — K7469 Other cirrhosis of liver: Secondary | ICD-10-CM

## 2020-09-18 DIAGNOSIS — N183 Chronic kidney disease, stage 3 unspecified: Secondary | ICD-10-CM

## 2020-09-18 DIAGNOSIS — I5032 Chronic diastolic (congestive) heart failure: Secondary | ICD-10-CM

## 2020-09-18 DIAGNOSIS — I2781 Cor pulmonale (chronic): Secondary | ICD-10-CM

## 2020-09-18 NOTE — Telephone Encounter (Signed)
Home health plans of care reviewed and signed off. Patient in Palliative/Hospice care.    ICD-10-CM   1. Decompensated liver disease ([place])  K74.69   2. Cor pulmonale, chronic (HCC)  I27.81   3. Stage 3 chronic kidney disease, unspecified whether stage 3a or 3b CKD (HCC)  N18.30   4. Chronic heart failure with preserved ejection fraction ([place])  I50.32      Adrian Prows, MD, [facility] 09/18/2020, 4:28 PM Office: [phone]

## 2020-10-03 ENCOUNTER — Other Ambulatory Visit: Payer: Self-pay

## 2020-10-03 MED ORDER — SILDENAFIL CITRATE 20 MG PO TABS: 20.0000 mg | ORAL_TABLET | Freq: Three times a day (TID) | ORAL | 2 refills | Status: DC

## 2020-10-31 ENCOUNTER — Ambulatory Visit: Payer: No Typology Code available for payment source

## 2020-11-02 ENCOUNTER — Other Ambulatory Visit: Payer: Self-pay | Admitting: Cardiology

## 2020-11-10 IMAGING — US IR PARACENTESIS
1 series · 3 of 3 positions shown · non-contrast
Comparison: none

INDICATION: Patient with history of congestive heart failure, renal
insufficiency and recurrent ascites. Request for therapeutic
paracentesis.

[Series 1: ir (id) (id)/(id)/(id) ir · 3 of 3 slices shown]
[im 1/3]
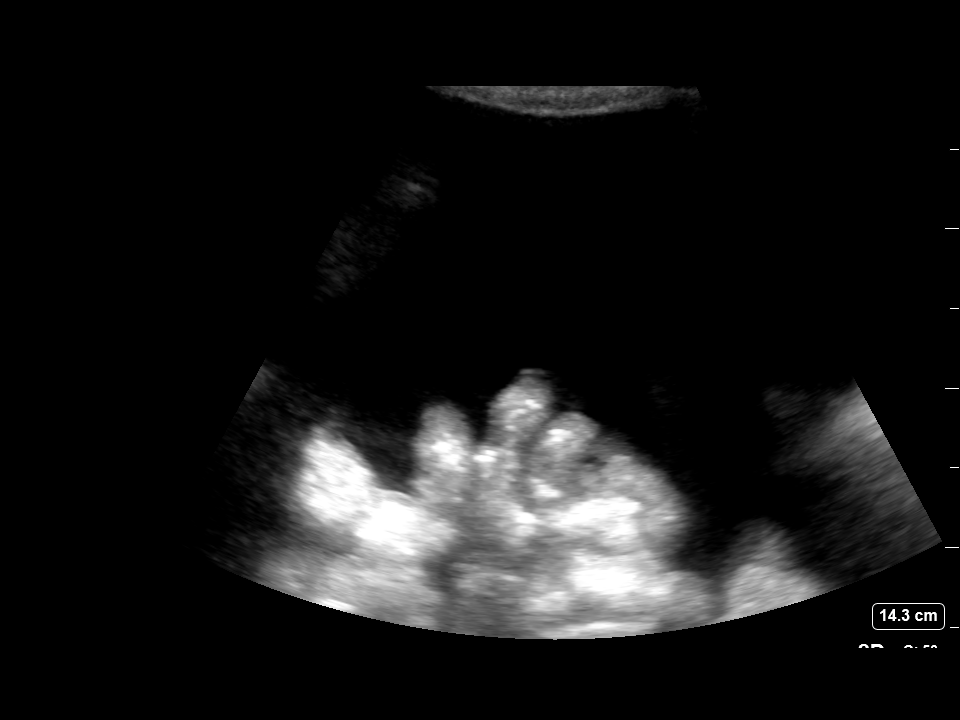
[im 2/3]
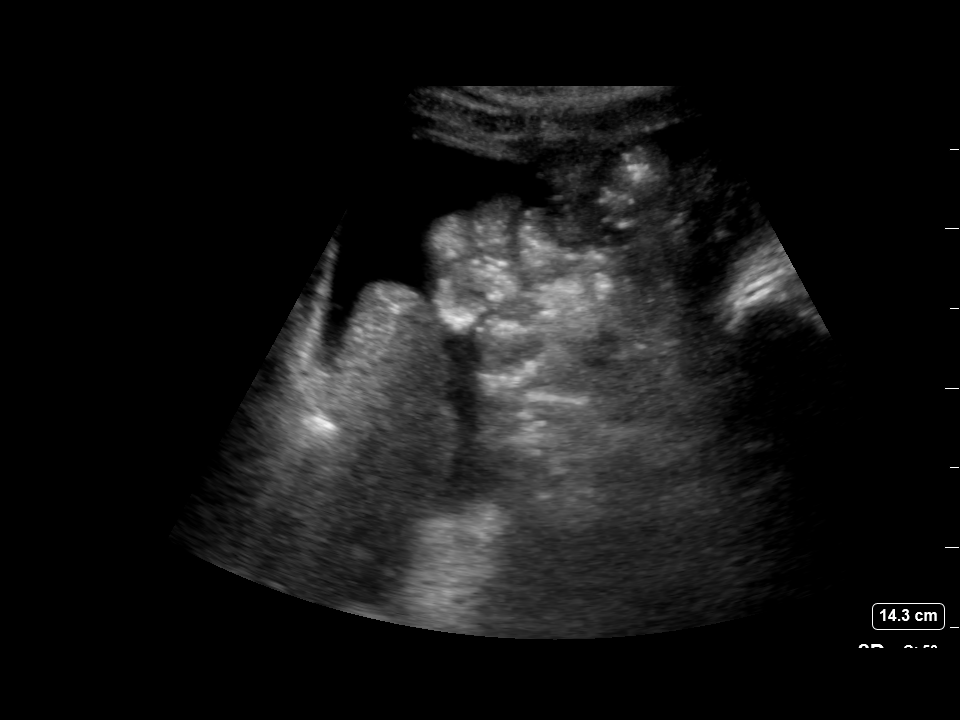
[im 3/3]
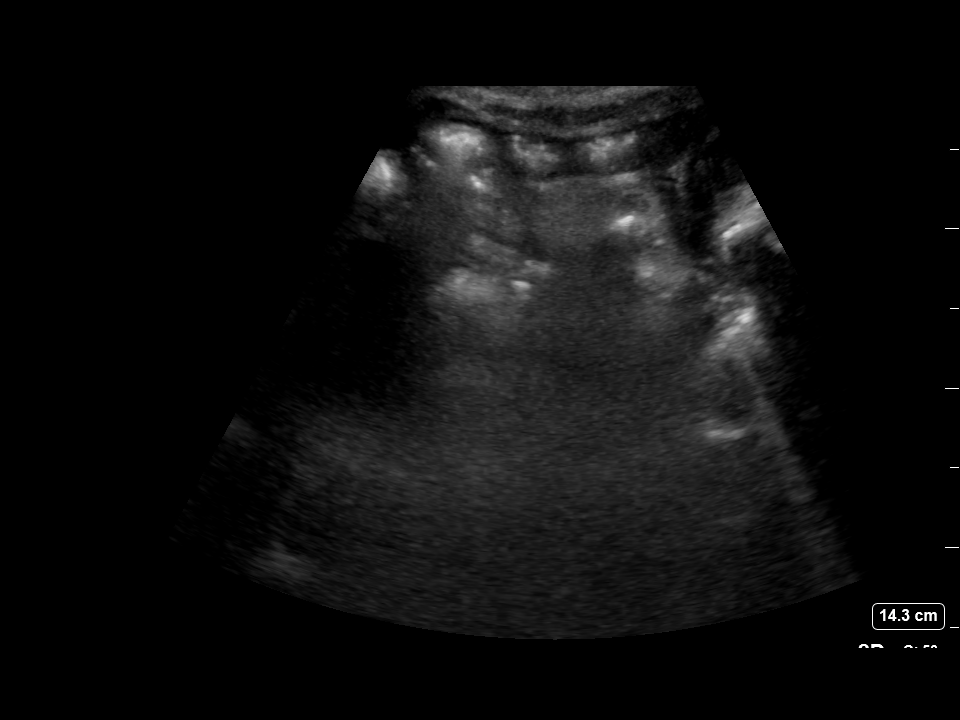

[3 of 3 positions shown; findings below may reference images not displayed]

EXAM:
ULTRASOUND GUIDED RIGHT LOWER QUADRANT PARACENTESIS

MEDICATIONS:
None.

COMPLICATIONS:
None immediate.

PROCEDURE:
Informed written consent was obtained from the patient after a
discussion of the risks, benefits and alternatives to treatment. A
timeout was performed prior to the initiation of the procedure.

Initial ultrasound scanning demonstrates a large amount of ascites
within the right lower abdominal quadrant. The right lower abdomen
was prepped and draped in the usual sterile fashion. 1% lidocaine
was used for local anesthesia.

Following this, a 19 gauge, 7-cm, Yueh catheter was introduced. An
ultrasound image was saved for documentation purposes. The
paracentesis was performed. The catheter was removed and a dressing
was applied. The patient tolerated the procedure well without
immediate post procedural complication.
FINDINGS: A total of approximately 10.5 L of clear yellow fluid was removed.
IMPRESSION: Successful ultrasound-guided paracentesis yielding 10.5 liters of
peritoneal fluid.

## 2020-11-11 ENCOUNTER — Other Ambulatory Visit: Payer: Self-pay | Admitting: Cardiology

## 2020-11-21 ENCOUNTER — Telehealth: Payer: Self-pay | Admitting: Cardiology

## 2020-11-21 DIAGNOSIS — I2781 Cor pulmonale (chronic): Secondary | ICD-10-CM

## 2020-11-21 DIAGNOSIS — Z515 Encounter for palliative care: Secondary | ICD-10-CM

## 2020-11-21 DIAGNOSIS — N1832 Chronic kidney disease, stage 3b: Secondary | ICD-10-CM

## 2020-11-21 DIAGNOSIS — D631 Anemia in chronic kidney disease: Secondary | ICD-10-CM

## 2020-11-21 DIAGNOSIS — K7469 Other cirrhosis of liver: Secondary | ICD-10-CM

## 2020-11-21 NOTE — Telephone Encounter (Signed)
ICD-10-CM   1. Encounter for palliative care  Z51.5   2. Decompensated liver disease ([place])  K74.69   3. Cor pulmonale, chronic (HCC)  I27.81   4. Anemia in stage 3b chronic kidney disease (HCC)  N18.32    D63.1    I have reviewed the home health plans of care, I have signed off on the orders for palliative care.   Adrian Prows, MD, [facility] 11/21/2020, 2:32 PM Office: [phone] Pager: [phone]

## 2020-12-11 ENCOUNTER — Telehealth: Payer: Self-pay | Admitting: Cardiology

## 2020-12-11 DIAGNOSIS — Z515 Encounter for palliative care: Secondary | ICD-10-CM

## 2020-12-11 DIAGNOSIS — I2781 Cor pulmonale (chronic): Secondary | ICD-10-CM

## 2020-12-11 DIAGNOSIS — K7469 Other cirrhosis of liver: Secondary | ICD-10-CM

## 2020-12-11 DIAGNOSIS — I5032 Chronic diastolic (congestive) heart failure: Secondary | ICD-10-CM

## 2020-12-11 DIAGNOSIS — R188 Other ascites: Secondary | ICD-10-CM

## 2020-12-11 NOTE — Telephone Encounter (Signed)
ICD-10-CM   1. Encounter for palliative care  Z51.5   2. Decompensated liver disease ([place])  K74.69   3. Cor pulmonale, chronic (HCC)  I27.81   4. Chronic heart failure with preserved ejection fraction (HCC)  I50.32   5. Other ascites  R18.8     I have reviewed the home health plan of care and orders and signed off on them.  Adrian Prows, MD, [facility] 12/11/2020, 12:59 PM Office: [phone] Pager: [phone]

## 2020-12-13 ENCOUNTER — Ambulatory Visit: Payer: No Typology Code available for payment source

## 2020-12-13 ENCOUNTER — Telehealth: Payer: Self-pay | Admitting: Critical Care Medicine

## 2021-01-01 ENCOUNTER — Telehealth: Payer: Self-pay | Admitting: Cardiology

## 2021-01-01 DIAGNOSIS — I2781 Cor pulmonale (chronic): Secondary | ICD-10-CM

## 2021-01-01 DIAGNOSIS — I5032 Chronic diastolic (congestive) heart failure: Secondary | ICD-10-CM

## 2021-01-01 DIAGNOSIS — K7469 Other cirrhosis of liver: Secondary | ICD-10-CM

## 2021-01-01 DIAGNOSIS — Z515 Encounter for palliative care: Secondary | ICD-10-CM

## 2021-01-01 DIAGNOSIS — N183 Chronic kidney disease, stage 3 unspecified: Secondary | ICD-10-CM

## 2021-01-01 NOTE — Telephone Encounter (Signed)
I have reviewed the home health plan of care, authorized hospice service and signed off on the orders.  Encounter for palliative care  Decompensated liver disease ([place])  Cor pulmonale, chronic (HCC)  Chronic heart failure with preserved ejection fraction ([place])  Stage 3 chronic kidney disease, unspecified whether stage 3a or 3b CKD ([place])   Adrian Prows, MD, [facility] 01/01/2021, 6:50 AM Office: [phone] Pager: [phone]

## 2021-01-17 ENCOUNTER — Other Ambulatory Visit: Payer: Self-pay

## 2021-01-17 ENCOUNTER — Ambulatory Visit: Payer: No Typology Code available for payment source | Attending: Internal Medicine

## 2021-01-17 DIAGNOSIS — Z23 Encounter for immunization: Secondary | ICD-10-CM

## 2021-01-17 NOTE — Progress Notes (Signed)
   Covid-19 Vaccination Clinic  Name:  DRISHTI PEPPERMAN    MRN: [id number] DOB: [birth year]  01/17/2021  Ms. Geddis was observed post Covid-19 immunization for 15 minutes without incident. She was provided with Vaccine Information Sheet and instruction to access the V-Safe system.   Ms. Koval was instructed to call 911 with any severe reactions post vaccine: Marland Kitchen Difficulty breathing  . Swelling of face and throat  . A fast heartbeat  . A bad rash all over body  . Dizziness and weakness   Immunizations Administered    Name Date Dose VIS Date Route   Moderna Covid-19 Booster Vaccine 01/17/2021 12:55 PM 0.25 mL 10/11/2020 Intramuscular   Manufacturer: Moderna   Lot: [id number]   [place]: [phone]

## 2021-01-24 ENCOUNTER — Other Ambulatory Visit: Payer: Self-pay | Admitting: Cardiology

## 2021-02-05 ENCOUNTER — Other Ambulatory Visit: Payer: Self-pay | Admitting: Cardiology

## 2021-02-27 ENCOUNTER — Telehealth: Payer: Self-pay | Admitting: Cardiology

## 2021-02-27 DIAGNOSIS — Z515 Encounter for palliative care: Secondary | ICD-10-CM

## 2021-02-27 DIAGNOSIS — I5032 Chronic diastolic (congestive) heart failure: Secondary | ICD-10-CM

## 2021-02-27 DIAGNOSIS — K7469 Other cirrhosis of liver: Secondary | ICD-10-CM

## 2021-02-27 DIAGNOSIS — I2781 Cor pulmonale (chronic): Secondary | ICD-10-CM | POA: Insufficient documentation

## 2021-02-27 DIAGNOSIS — N184 Chronic kidney disease, stage 4 (severe): Secondary | ICD-10-CM

## 2021-02-27 NOTE — Telephone Encounter (Signed)
ICD-10-CM   1. Encounter for palliative care  Z51.5   2. Decompensated liver disease ([place])  K74.69   3. Cor pulmonale, chronic (HCC)  I27.81   4. Chronic diastolic heart failure (HCC)  I50.32   5. CKD (chronic kidney disease) stage 4, GFR 15-29 ml/min (HCC)  N18.4    I have reviewed the palliative care/hospice care home health plans of care and signed off on the order set.   Adrian Prows, MD, [facility] 02/27/2021, 8:25 PM Office: [phone] Pager: [phone]

## 2021-03-07 ENCOUNTER — Other Ambulatory Visit: Payer: Self-pay | Admitting: Cardiology

## 2021-04-17 ENCOUNTER — Other Ambulatory Visit: Payer: Self-pay | Admitting: Cardiology

## 2021-04-17 ENCOUNTER — Telehealth: Payer: Self-pay

## 2021-04-17 ENCOUNTER — Telehealth: Payer: Self-pay | Admitting: Cardiology

## 2021-04-17 DIAGNOSIS — K7469 Other cirrhosis of liver: Secondary | ICD-10-CM

## 2021-04-17 DIAGNOSIS — R188 Other ascites: Secondary | ICD-10-CM

## 2021-04-17 NOTE — Telephone Encounter (Signed)
Patient has returned back to hospice after completing respite stay.  I approved continued hospice care.   Adrian Prows, MD, [facility] 04/17/2021, 6:00 PM Office: [phone] Pager: [phone]

## 2021-04-17 NOTE — Telephone Encounter (Signed)
Vanessa Williamson from [facility], needed an order for paratenesis. Call transferred to [facility].

## 2021-04-18 ENCOUNTER — Emergency Department (HOSPITAL_COMMUNITY)

## 2021-04-18 ENCOUNTER — Emergency Department (HOSPITAL_COMMUNITY)
Admission: EM | Admit: 2021-04-18 | Discharge: 2021-04-18 | Disposition: A | Discharge status: Home / Self Care | Attending: Emergency Medicine | Admitting: Emergency Medicine

## 2021-04-18 ENCOUNTER — Other Ambulatory Visit: Payer: Self-pay

## 2021-04-18 DIAGNOSIS — J449 Chronic obstructive pulmonary disease, unspecified: Secondary | ICD-10-CM | POA: Insufficient documentation

## 2021-04-18 DIAGNOSIS — Z20822 Contact with and (suspected) exposure to covid-19: Secondary | ICD-10-CM | POA: Insufficient documentation

## 2021-04-18 DIAGNOSIS — Z87891 Personal history of nicotine dependence: Secondary | ICD-10-CM | POA: Diagnosis not present

## 2021-04-18 DIAGNOSIS — R140 Abdominal distension (gaseous): Secondary | ICD-10-CM | POA: Diagnosis present

## 2021-04-18 DIAGNOSIS — Z85828 Personal history of other malignant neoplasm of skin: Secondary | ICD-10-CM | POA: Diagnosis not present

## 2021-04-18 DIAGNOSIS — I5033 Acute on chronic diastolic (congestive) heart failure: Secondary | ICD-10-CM | POA: Diagnosis not present

## 2021-04-18 DIAGNOSIS — E875 Hyperkalemia: Secondary | ICD-10-CM

## 2021-04-18 DIAGNOSIS — I110 Hypertensive heart disease with heart failure: Secondary | ICD-10-CM | POA: Diagnosis not present

## 2021-04-18 DIAGNOSIS — Z79899 Other long term (current) drug therapy: Secondary | ICD-10-CM | POA: Insufficient documentation

## 2021-04-18 DIAGNOSIS — R188 Other ascites: Secondary | ICD-10-CM

## 2021-04-18 LAB — COMPREHENSIVE METABOLIC PANEL
ALT: 7 U/L (ref 0–44)
AST: 11 U/L — ABNORMAL LOW (ref 15–41)
Albumin: 2.9 g/dL — ABNORMAL LOW (ref 3.5–5.0)
Alkaline Phosphatase: 59 U/L (ref 38–126)
Anion gap: 8 (ref 5–15)
BUN: 64 mg/dL — ABNORMAL HIGH (ref 8–23)
CO2: 20 mmol/L — ABNORMAL LOW (ref 22–32)
Calcium: 8.4 mg/dL — ABNORMAL LOW (ref 8.9–10.3)
Chloride: 109 mmol/L (ref 98–111)
Creatinine, Ser: 2.74 mg/dL — ABNORMAL HIGH (ref 0.44–1.00)
GFR, Estimated: 19 mL/min — ABNORMAL LOW (ref >60)
Glucose, Bld: 94 mg/dL (ref 70–99)
Potassium: 5.6 mmol/L — ABNORMAL HIGH (ref 3.5–5.1)
Sodium: 137 mmol/L (ref 135–145)
Total Bilirubin: 0.1 mg/dL — ABNORMAL LOW (ref 0.3–1.2)
Total Protein: 6.6 g/dL (ref 6.5–8.1)

## 2021-04-18 LAB — CBC WITH DIFFERENTIAL/PLATELET
Abs Immature Granulocytes: 0.16 10*3/uL — ABNORMAL HIGH (ref 0.00–0.07)
Basophils Absolute: 0 10*3/uL (ref 0.0–0.1)
Basophils Relative: 0 %
Eosinophils Absolute: 0.1 10*3/uL (ref 0.0–0.5)
Eosinophils Relative: 2 %
HCT: 30.1 % — ABNORMAL LOW (ref 36.0–46.0)
Hemoglobin: 9.8 g/dL — ABNORMAL LOW (ref 12.0–15.0)
Immature Granulocytes: 4 %
Lymphocytes Relative: 18 %
Lymphs Abs: 0.7 10*3/uL (ref 0.7–4.0)
MCH: 33.1 pg (ref 26.0–34.0)
MCHC: 32.6 g/dL (ref 30.0–36.0)
MCV: 101.7 fL — ABNORMAL HIGH (ref 80.0–100.0)
Monocytes Absolute: 0.5 10*3/uL (ref 0.1–1.0)
Monocytes Relative: 13 %
Neutro Abs: 2.3 10*3/uL (ref 1.7–7.7)
Neutrophils Relative %: 63 %
Platelets: 115 10*3/uL — ABNORMAL LOW (ref 150–400)
RBC: 2.96 MIL/uL — ABNORMAL LOW (ref 3.87–5.11)
RDW: 13.7 % (ref 11.5–15.5)
WBC: 3.7 10*3/uL — ABNORMAL LOW (ref 4.0–10.5)
nRBC: 0 % (ref 0.0–0.2)

## 2021-04-18 LAB — PROTIME-INR
INR: 0.8 (ref 0.8–1.2)
Prothrombin Time: 10.6 seconds — ABNORMAL LOW (ref 11.4–15.2)

## 2021-04-18 LAB — RESP PANEL BY RT-PCR (FLU A&B, COVID) ARPGX2
Influenza A by PCR: NEGATIVE
Influenza B by PCR: NEGATIVE
SARS Coronavirus 2 by RT PCR: NEGATIVE

## 2021-04-18 MED ORDER — SODIUM ZIRCONIUM CYCLOSILICATE 10 G PO PACK
10.0000 g | PACK | Freq: Once | ORAL | Status: AC
  Administered 2021-04-18: 10 g via ORAL
  Filled 2021-04-18: qty 1

## 2021-04-18 MED ORDER — LIDOCAINE HCL 1 % IJ SOLN
INTRAMUSCULAR | Status: AC
  Filled 2021-04-18: qty 20

## 2021-04-18 NOTE — ED Triage Notes (Signed)
Pt came from home via EMS. Pt is on hospice, caregiver in home daily, hospice home health sees pt, unclear on how often. Pt on hospice d/t Liver failure, heart failure and lung failure.  C/C: Chronic Abdominal pain worsening over the last 2 weeks and chronic diarrhea Paracentesis performed a year ago d/t fluid accumulation in abdomen d/t liver failure, pt states this feels like the same pain as when the paracentesis was performed  122/88 98 bpm 18 RR 3L [place] at baseline

## 2021-04-18 NOTE — ED Notes (Signed)
Called [facility] for transport home to [place]

## 2021-04-18 NOTE — ED Provider Notes (Signed)
[facility] DEPT Provider Note   CSN: [id number] Arrival date & time: 04/18/21  1121     History Chief Complaint  Patient presents with  . Bloated    Vanessa Williamson is a 65 y.o. female.  HPI     65 year old female comes in a chief complaint of bloating. Patient has history of CHF, cor pulmonale, ESLD.  She reports that over the last few days she has been feeling bloated and it appears to her that she needs paracentesis.  She had paracentesis done more than a year ago.   Review of system is positive for diarrhea that has been present for a month.  Patient denies any bloody stools, fevers, chills.  She has some nausea without vomiting and appetite is low.  Caregiver at the bedside who also provides collateral information.  Past Medical History:  Diagnosis Date  . Anemia   . Cancer ([place])    cervical  1970s   . Chronic kidney disease   . COPD (chronic obstructive pulmonary disease) ([place])   . GERD (gastroesophageal reflux disease)   . Hypertension     Patient Active Problem List   Diagnosis Date Noted  . CKD (chronic kidney disease) stage 4, GFR 15-29 ml/min (HCC) 02/27/2021  . Chronic diastolic heart failure ([place]) 02/27/2021  . Cor pulmonale, chronic ([place]) 02/27/2021  . Abdominal ascites   . Hypervolemia   . Decompensated liver disease ([place]) 11/02/2019  . AKI (acute kidney injury) ([place])   . Encounter for palliative care   . Pulmonary arterial hypertension ([place]) 10/30/2019  . Acute diastolic congestive heart failure ([place]) 10/29/2019  . (HFpEF) heart failure with preserved ejection fraction ([place]) 09/10/2019  . Anemia in chronic kidney disease (CKD) 07/20/2019  . Incarcerated ventral hernia 10/16/2017  . Ventral hernia 02/03/2014  . Unspecified vitamin D deficiency 12/11/2013  . Morbid obesity with BMI of 40.0-44.9, adult ([place]) 12/10/2013  . Tobacco use disorder 12/10/2013  . COPD (chronic obstructive pulmonary disease) ([place]) 08/19/2012   . HTN (hypertension) 03/08/2012  . GERD (gastroesophageal reflux disease) 03/08/2012  . Allergic rhinitis 03/08/2012    Past Surgical History:  Procedure Laterality Date  . BREAST BIOPSY Left    benign  . CERVIX LESION DESTRUCTION     frozen  . COLONOSCOPY  03/09/2008  . EYE SURGERY     laser  . FRACTURE SURGERY    . HUMERUS FRACTURE SURGERY     left  2005  . IR PARACENTESIS  11/03/2019  . IR PARACENTESIS  11/10/2019  . IR PARACENTESIS  12/06/2019  . IR PARACENTESIS  01/17/2020  . IR PARACENTESIS  03/16/2020  . RIGHT HEART CATH N/A 11/02/2019   Procedure: RIGHT HEART CATH;  Surgeon: Adrian Prows, MD;  Location: [facility];  Service: Cardiovascular;  Laterality: N/A;  . RIGHT/LEFT HEART CATH AND CORONARY ANGIOGRAPHY N/A 09/14/2019   Procedure: RIGHT/LEFT HEART CATH AND CORONARY ANGIOGRAPHY;  Surgeon: Adrian Prows, MD;  Location: [facility];  Service: Cardiovascular;  Laterality: N/A;  . VENTRAL HERNIA REPAIR N/A 10/16/2017   Procedure: [place];  Surgeon: Alphonsa Overall, MD;  Location: [facility];  Service: General;  Laterality: N/A;     OB History   No obstetric history on file.     Family History  Problem Relation Age of Onset  . Hypertension Mother   . Heart disease Mother   . Hyperlipidemia Mother   . Heart disease Father   . Hypertension  Father   . Heart disease Maternal Grandmother   . Heart disease Maternal Grandfather   . Cancer Sister   . Stroke Brother   . Hypertension Brother   . Colon cancer Neg Hx   . Colon polyps Neg Hx   . Esophageal cancer Neg Hx   . Stomach cancer Neg Hx   . Rectal cancer Neg Hx     Social History   Tobacco Use  . Smoking status: Former Smoker    Packs/day: 0.25    Types: Cigarettes    Quit date: 08/2019    Years since quitting: 1.6  . Smokeless tobacco: Never Used  . Tobacco comment: been smoking since age 14  Vaping Use  . Vaping Use: Never used  Substance Use Topics  . Alcohol  use: Yes    Comment: rarely  . Drug use: No    Comment: 2005    Home Medications Prior to Admission medications   Medication Sig Start Date End Date Taking? Authorizing Provider  acetaminophen (TYLENOL) 325 MG tablet Take 2 tablets (650 mg total) by mouth every 4 (four) hours as needed for headache or mild pain. Patient not taking: Reported on 10/29/2019 09/16/19   Adrian Prows, MD  acetaminophen (TYLENOL) 500 MG tablet Take 500-1,000 mg by mouth every 6 (six) hours as needed for mild pain or headache.    [provider]  budesonide (PULMICORT) 0.25 MG/2ML nebulizer solution Take 2 mLs (0.25 mg total) by nebulization 2 (two) times daily. 09/16/19   Adrian Prows, MD  fluticasone (FLONASE) 50 MCG/ACT nasal spray Place 1-2 sprays into both nostrils daily as needed for allergies or rhinitis.    [provider]  KLOR-CON M10 10 MEQ tablet TAKE 1 TABLET (10 MEQ TOTAL) BY MOUTH 2 (TWO) TIMES DAILY. 09/07/20   Adrian Prows, MD  loratadine (CLARITIN) 10 MG tablet Take 10 mg by mouth daily as needed for allergies or rhinitis.     [provider]  meloxicam (MOBIC) 7.5 MG tablet Take 7.5 mg by mouth daily as needed for pain.    [provider]  OXYGEN Inhale 3 L/min into the lungs continuous.    [provider]  sildenafil (REVATIO) 20 MG tablet TAKE ONE TABLET BY MOUTH THREE TIMES A DAY 03/07/21   Adrian Prows, MD  spironolactone (ALDACTONE) 50 MG tablet TAKE 1 TABLET BY MOUTH EVERY DAY 08/07/20   Adrian Prows, MD  torsemide (DEMADEX) 20 MG tablet Take 2 tablets (40 mg total) by mouth daily. 11/14/19   Mosetta Anis, MD  torsemide (DEMADEX) 20 MG tablet Take 1 tablet (20 mg total) by mouth 2 (two) times daily with a meal. 05/02/20   Adrian Prows, MD  torsemide (DEMADEX) 20 MG tablet TAKE 1 TABLET (20 MG TOTAL) BY MOUTH 2 (TWO) TIMES DAILY WITH A MEAL. 09/07/20   Adrian Prows, MD    Allergies    Patient has no known allergies.  Review of Systems   Review of Systems   Constitutional: Positive for activity change.  Respiratory: Negative for shortness of breath.   Cardiovascular: Negative for chest pain.  Gastrointestinal: Positive for abdominal distention, diarrhea and nausea.  Genitourinary: Negative for dysuria.  Hematological: Does not bruise/bleed easily.  All other systems reviewed and are negative.   Physical Exam Updated Vital Signs BP 127/84   Pulse 97   Temp 98.4 F (36.9 C) (Oral)   Resp 20   Ht 5\' 3"  (1.6 m)   Wt 59.9 kg  SpO2 97%   BMI 23.38 kg/m   Physical Exam Vitals and nursing note reviewed.  Constitutional:      Appearance: She is well-developed.  HENT:     Head: Normocephalic and atraumatic.  Cardiovascular:     Rate and Rhythm: Normal rate.  Pulmonary:     Effort: Pulmonary effort is normal.  Abdominal:     General: Bowel sounds are normal. There is distension.     Tenderness: There is abdominal tenderness. There is no guarding or rebound.  Musculoskeletal:     Cervical back: Normal range of motion and neck supple.  Skin:    General: Skin is warm and dry.  Neurological:     Mental Status: She is alert and oriented to person, place, and time.     ED Results / Procedures / Treatments   Labs (all labs ordered are listed, but only abnormal results are displayed) Labs Reviewed  RESP PANEL BY RT-PCR (FLU A&B, COVID) [id number]  COMPREHENSIVE METABOLIC PANEL  CBC WITH DIFFERENTIAL/PLATELET  PROTIME-INR    EKG None  Radiology No results found.  Procedures Procedures   Medications Ordered in ED Medications - No data to display  ED Course  I have reviewed the triage vital signs and the nursing notes.  Pertinent labs & imaging results that were available during my care of the patient were reviewed by me and considered in my medical decision making (see chart for details).    MDM Rules/Calculators/A&P                          65 year old comes in a chief complaint of abdominal distention and related  bloating. She has clear evidence of large volume ascites.  Patient is nontoxic and denies any infection-like constitutional such as fevers, chills, sweats.  Doubt SBP.  She is having some nausea, diarrhea which are nonspecific.   IR consulted for ultrasound-guided paracentesis, they will be able to take care of the patient today.  Basic lab work-up ordered.  Final Clinical Impression(s) / ED Diagnoses Final diagnoses:  Abdominal bloating    Rx / DC Orders ED Discharge Orders    None       Varney Biles, MD 04/18/21 1421

## 2021-04-18 NOTE — ED Provider Notes (Signed)
  Physical Exam  BP 135/80 (BP Location: Right Arm)   Pulse (!) 107   Temp 98.4 F (36.9 C) (Oral)   Resp 17   Ht 5\' 3"  (1.6 m)   Wt 59.9 kg   SpO2 99%   BMI 23.38 kg/m   Physical Exam  ED Course/Procedures     Procedures  MDM  Care assumed at 4 PM.  Patient is on hospice for cirrhosis.  Has ascites on exam and has been requiring paracentesis before.  Signout pending paracentesis and reassessment.   6:14 PM IR was able to perform paracentesis and took about 9 L out.  Patient's vitals are stable.  Patient is feeling much better now.  Patient is in hospice and paracentesis mainly for comfort.  I told family that patient can get outpatient paracentesis more regularly if she is symptomatic.  She likes to go home now so we will discharge patient.      Drenda Freeze, MD 04/18/21 [phone]

## 2021-04-18 NOTE — Progress Notes (Signed)
Manufacturing engineer [facility]) hospital liaison note:  This is a current [facility] hospice patient with a terminal diagnosis of hypertensive heart disease. Hospital liaison will follow while in ED and if admitted.   A Please do not hesitate to call with questions.   Thank you,   Farrel Gordon, RN, [place] (listed on [facility] under [place])    [phone]

## 2021-04-18 NOTE — Discharge Instructions (Addendum)
You have ascites and you may need regular paracentesis.  Please call Dr. Nadyne Coombes for follow-up and if you have worsening abdominal swelling or shortness of breath you may need to schedule outpatient paracentesis  Your potassium is slightly high today so please stop your potassium supplement and repeat potassium level in a week  Return to ER if you have worse abdominal distention, chest pain, trouble breathing.

## 2021-04-19 ENCOUNTER — Ambulatory Visit (HOSPITAL_COMMUNITY): Admission: RE | Admit: 2021-04-19 | Payer: No Typology Code available for payment source | Source: Ambulatory Visit

## 2021-04-23 ENCOUNTER — Telehealth: Payer: Self-pay | Admitting: Cardiology

## 2021-04-23 DIAGNOSIS — I2781 Cor pulmonale (chronic): Secondary | ICD-10-CM

## 2021-04-23 DIAGNOSIS — Z515 Encounter for palliative care: Secondary | ICD-10-CM

## 2021-04-23 DIAGNOSIS — R188 Other ascites: Secondary | ICD-10-CM

## 2021-04-23 DIAGNOSIS — I5032 Chronic diastolic (congestive) heart failure: Secondary | ICD-10-CM

## 2021-04-23 NOTE — Telephone Encounter (Signed)
I have reviewed hospice home health plans of care and signed off on the orders.    ICD-10-CM   1. Chronic heart failure with preserved ejection fraction (HCC)  I50.32   2. Other ascites  R18.8   3. Cor pulmonale, chronic (HCC)  I27.81   4. Encounter for palliative care  Z51.5       Adrian Prows, MD, [facility] 04/23/2021, 7:08 AM Office: [phone] Pager: [phone]

## 2021-05-11 ENCOUNTER — Other Ambulatory Visit: Payer: Self-pay

## 2021-05-11 MED ORDER — POTASSIUM CHLORIDE CRYS ER 10 MEQ PO TBCR: EXTENDED_RELEASE_TABLET | ORAL | 3 refills | Status: AC

## 2021-05-11 MED ORDER — TORSEMIDE 20 MG PO TABS: 20.0000 mg | ORAL_TABLET | Freq: Two times a day (BID) | ORAL | 3 refills | Status: AC

## 2021-05-28 ENCOUNTER — Other Ambulatory Visit: Payer: Self-pay | Admitting: Cardiology

## 2021-05-28 ENCOUNTER — Encounter (HOSPITAL_COMMUNITY): Payer: Self-pay

## 2021-05-28 DIAGNOSIS — R188 Other ascites: Secondary | ICD-10-CM

## 2021-05-28 DIAGNOSIS — K7469 Other cirrhosis of liver: Secondary | ICD-10-CM

## 2021-05-30 ENCOUNTER — Other Ambulatory Visit: Payer: Self-pay

## 2021-05-30 ENCOUNTER — Ambulatory Visit (HOSPITAL_COMMUNITY)
Admission: RE | Admit: 2021-05-30 | Discharge: 2021-05-30 | Disposition: A | Discharge status: Home / Self Care | Source: Ambulatory Visit | Attending: Cardiology | Admitting: Cardiology

## 2021-05-30 DIAGNOSIS — K7469 Other cirrhosis of liver: Secondary | ICD-10-CM

## 2021-05-30 DIAGNOSIS — R188 Other ascites: Secondary | ICD-10-CM | POA: Insufficient documentation

## 2021-05-30 HISTORY — PX: IR PARACENTESIS: IMG2679

## 2021-05-30 MED ORDER — LIDOCAINE HCL (PF) 1 % IJ SOLN
INTRAMUSCULAR | Status: AC
  Filled 2021-05-30: qty 30

## 2021-05-30 MED ORDER — LIDOCAINE HCL (PF) 1 % IJ SOLN
INTRAMUSCULAR | Status: DC | PRN
  Administered 2021-05-30: 5 mL

## 2021-05-30 NOTE — Procedures (Signed)
PROCEDURE SUMMARY:  Successful image-guided paracentesis from the right lower abdomen.  Yielded 12.0 liters of clear yellow fluid.  No immediate complications.  EBL < 1 mL Patient tolerated well.   Specimen was not sent for labs.  Please see imaging section of Epic for full dictation.  Joaquim Nam PA-C 05/30/2021 1:36 PM

## 2021-06-22 DEATH — deceased
